# Patient Record
Sex: Female | Born: 1965 | Race: White | Hispanic: No | Smoking: Current every day smoker
Health system: Southern US, Community
[De-identification: ages and names within clinical notes are randomized; demographics above are authoritative.]

## PROBLEM LIST (undated history)

## (undated) DIAGNOSIS — L719 Rosacea, unspecified: Secondary | ICD-10-CM

## (undated) DIAGNOSIS — F419 Anxiety disorder, unspecified: Secondary | ICD-10-CM

## (undated) DIAGNOSIS — A419 Sepsis, unspecified organism: Secondary | ICD-10-CM

## (undated) DIAGNOSIS — Z87442 Personal history of urinary calculi: Secondary | ICD-10-CM

## (undated) DIAGNOSIS — Z86010 Personal history of colonic polyps: Secondary | ICD-10-CM

## (undated) DIAGNOSIS — M654 Radial styloid tenosynovitis [de Quervain]: Secondary | ICD-10-CM

## (undated) DIAGNOSIS — D131 Benign neoplasm of stomach: Secondary | ICD-10-CM

## (undated) DIAGNOSIS — F32A Depression, unspecified: Secondary | ICD-10-CM

## (undated) DIAGNOSIS — F329 Major depressive disorder, single episode, unspecified: Secondary | ICD-10-CM

## (undated) DIAGNOSIS — C801 Malignant (primary) neoplasm, unspecified: Secondary | ICD-10-CM

## (undated) DIAGNOSIS — N390 Urinary tract infection, site not specified: Secondary | ICD-10-CM

## (undated) DIAGNOSIS — K5790 Diverticulosis of intestine, part unspecified, without perforation or abscess without bleeding: Secondary | ICD-10-CM

## (undated) DIAGNOSIS — E786 Lipoprotein deficiency: Secondary | ICD-10-CM

## (undated) HISTORY — DX: Personal history of colonic polyps: Z86.010

## (undated) HISTORY — PX: VAGINAL HYSTERECTOMY: SUR661

## (undated) HISTORY — PX: TUBAL LIGATION: SHX77

## (undated) HISTORY — DX: Diverticulosis of intestine, part unspecified, without perforation or abscess without bleeding: K57.90

## (undated) HISTORY — DX: Rosacea, unspecified: L71.9

## (undated) HISTORY — DX: Anxiety disorder, unspecified: F41.9

## (undated) HISTORY — PX: POLYPECTOMY: SHX149

## (undated) HISTORY — PX: OOPHORECTOMY: SHX86

## (undated) HISTORY — DX: Radial styloid tenosynovitis (de quervain): M65.4

## (undated) HISTORY — PX: COLONOSCOPY: SHX174

## (undated) HISTORY — DX: Benign neoplasm of stomach: D13.1

## (undated) HISTORY — DX: Malignant (primary) neoplasm, unspecified: C80.1

## (undated) HISTORY — PX: MULTIPLE TOOTH EXTRACTIONS: SHX2053

## (undated) HISTORY — DX: Urinary tract infection, site not specified: N39.0

## (undated) HISTORY — PX: WISDOM TOOTH EXTRACTION: SHX21

## (undated) HISTORY — DX: Lipoprotein deficiency: E78.6

## (undated) HISTORY — DX: Sepsis, unspecified organism: A41.9

## (undated) HISTORY — PX: UMBILICAL HERNIA REPAIR: SHX196

---

## 1998-10-23 ENCOUNTER — Other Ambulatory Visit: Admission: RE | Admit: 1998-10-23 | Discharge: 1998-10-23 | Payer: Self-pay | Admitting: Family Medicine

## 2000-09-01 ENCOUNTER — Other Ambulatory Visit: Admission: RE | Admit: 2000-09-01 | Discharge: 2000-09-01 | Payer: Self-pay | Admitting: Family Medicine

## 2000-11-09 ENCOUNTER — Encounter: Admission: RE | Admit: 2000-11-09 | Discharge: 2000-11-09 | Payer: Self-pay | Admitting: Family Medicine

## 2000-11-09 ENCOUNTER — Encounter: Payer: Self-pay | Admitting: Family Medicine

## 2001-11-30 ENCOUNTER — Other Ambulatory Visit: Admission: RE | Admit: 2001-11-30 | Discharge: 2001-11-30 | Payer: Self-pay | Admitting: Family Medicine

## 2001-12-31 ENCOUNTER — Emergency Department (HOSPITAL_COMMUNITY): Admission: EM | Admit: 2001-12-31 | Discharge: 2001-12-31 | Payer: Self-pay | Admitting: Emergency Medicine

## 2003-01-08 ENCOUNTER — Other Ambulatory Visit: Admission: RE | Admit: 2003-01-08 | Discharge: 2003-01-08 | Payer: Self-pay | Admitting: Family Medicine

## 2003-04-09 ENCOUNTER — Encounter (INDEPENDENT_AMBULATORY_CARE_PROVIDER_SITE_OTHER): Payer: Self-pay

## 2003-04-09 ENCOUNTER — Ambulatory Visit (HOSPITAL_COMMUNITY): Admission: RE | Admit: 2003-04-09 | Discharge: 2003-04-09 | Payer: Self-pay | Admitting: *Deleted

## 2003-04-09 HISTORY — PX: CERVICAL BIOPSY  W/ LOOP ELECTRODE EXCISION: SUR135

## 2003-08-20 ENCOUNTER — Other Ambulatory Visit: Admission: RE | Admit: 2003-08-20 | Discharge: 2003-08-20 | Payer: Self-pay | Admitting: Family Medicine

## 2003-12-24 ENCOUNTER — Other Ambulatory Visit: Admission: RE | Admit: 2003-12-24 | Discharge: 2003-12-24 | Payer: Self-pay | Admitting: Family Medicine

## 2004-05-12 ENCOUNTER — Other Ambulatory Visit: Admission: RE | Admit: 2004-05-12 | Discharge: 2004-05-12 | Payer: Self-pay | Admitting: Family Medicine

## 2004-09-11 ENCOUNTER — Emergency Department (HOSPITAL_COMMUNITY): Admission: EM | Admit: 2004-09-11 | Discharge: 2004-09-11 | Payer: Self-pay | Admitting: Emergency Medicine

## 2004-09-13 ENCOUNTER — Ambulatory Visit: Payer: Self-pay | Admitting: Family Medicine

## 2004-09-13 ENCOUNTER — Encounter: Admission: RE | Admit: 2004-09-13 | Discharge: 2004-09-13 | Payer: Self-pay | Admitting: Family Medicine

## 2004-10-20 ENCOUNTER — Other Ambulatory Visit: Admission: RE | Admit: 2004-10-20 | Discharge: 2004-10-20 | Payer: Self-pay | Admitting: Family Medicine

## 2004-10-20 ENCOUNTER — Ambulatory Visit: Payer: Self-pay | Admitting: Family Medicine

## 2005-03-01 ENCOUNTER — Ambulatory Visit: Payer: Self-pay | Admitting: Family Medicine

## 2005-03-09 ENCOUNTER — Ambulatory Visit: Payer: Self-pay | Admitting: Family Medicine

## 2005-04-19 ENCOUNTER — Ambulatory Visit: Payer: Self-pay | Admitting: Family Medicine

## 2005-10-07 ENCOUNTER — Ambulatory Visit: Payer: Self-pay | Admitting: Family Medicine

## 2005-12-28 ENCOUNTER — Encounter: Payer: Self-pay | Admitting: Family Medicine

## 2005-12-28 ENCOUNTER — Ambulatory Visit: Payer: Self-pay | Admitting: Family Medicine

## 2005-12-28 ENCOUNTER — Other Ambulatory Visit: Admission: RE | Admit: 2005-12-28 | Discharge: 2005-12-28 | Payer: Self-pay | Admitting: Family Medicine

## 2005-12-28 LAB — CONVERTED CEMR LAB: Pap Smear: NORMAL

## 2006-03-30 ENCOUNTER — Emergency Department (HOSPITAL_COMMUNITY): Admission: EM | Admit: 2006-03-30 | Discharge: 2006-03-30 | Payer: Self-pay | Admitting: Emergency Medicine

## 2006-08-08 HISTORY — PX: UPPER GASTROINTESTINAL ENDOSCOPY: SHX188

## 2006-10-17 ENCOUNTER — Ambulatory Visit: Payer: Self-pay | Admitting: Internal Medicine

## 2006-10-17 LAB — CONVERTED CEMR LAB
ALT: 37 units/L (ref 0–40)
AST: 26 units/L (ref 0–37)
Albumin: 3.5 g/dL (ref 3.5–5.2)
Alkaline Phosphatase: 64 units/L (ref 39–117)
Amylase: 42 units/L (ref 27–131)
BUN: 12 mg/dL (ref 6–23)
Basophils Absolute: 0.2 10*3/uL — ABNORMAL HIGH (ref 0.0–0.1)
Basophils Relative: 1.3 % — ABNORMAL HIGH (ref 0.0–1.0)
Bilirubin, Direct: 0.1 mg/dL (ref 0.0–0.3)
CO2: 23 meq/L (ref 19–32)
Calcium: 9 mg/dL (ref 8.4–10.5)
Chloride: 107 meq/L (ref 96–112)
Creatinine, Ser: 0.7 mg/dL (ref 0.4–1.2)
Eosinophils Absolute: 0.4 10*3/uL (ref 0.0–0.6)
Eosinophils Relative: 2.6 % (ref 0.0–5.0)
GFR calc Af Amer: 117 mL/min
GFR calc non Af Amer: 97 mL/min
Glucose, Bld: 92 mg/dL (ref 70–99)
HCT: 44.9 % (ref 36.0–46.0)
Hemoglobin: 15.2 g/dL — ABNORMAL HIGH (ref 12.0–15.0)
Lipase: 19 units/L (ref 11.0–59.0)
Lymphocytes Relative: 30.7 % (ref 12.0–46.0)
MCHC: 33.9 g/dL (ref 30.0–36.0)
MCV: 86.3 fL (ref 78.0–100.0)
Monocytes Absolute: 1 10*3/uL — ABNORMAL HIGH (ref 0.2–0.7)
Monocytes Relative: 6.4 % (ref 3.0–11.0)
Neutro Abs: 8.8 10*3/uL — ABNORMAL HIGH (ref 1.4–7.7)
Neutrophils Relative %: 59 % (ref 43.0–77.0)
Platelets: 332 10*3/uL (ref 150–400)
Potassium: 3.6 meq/L (ref 3.5–5.1)
RBC: 5.2 M/uL — ABNORMAL HIGH (ref 3.87–5.11)
RDW: 12.9 % (ref 11.5–14.6)
Sodium: 140 meq/L (ref 135–145)
Total Bilirubin: 0.5 mg/dL (ref 0.3–1.2)
Total Protein: 6.4 g/dL (ref 6.0–8.3)
WBC: 15 10*3/uL — ABNORMAL HIGH (ref 4.5–10.5)

## 2006-10-20 ENCOUNTER — Ambulatory Visit: Payer: Self-pay | Admitting: Internal Medicine

## 2006-10-20 ENCOUNTER — Encounter (INDEPENDENT_AMBULATORY_CARE_PROVIDER_SITE_OTHER): Payer: Self-pay | Admitting: Specialist

## 2006-11-27 ENCOUNTER — Ambulatory Visit: Payer: Self-pay | Admitting: Internal Medicine

## 2007-02-21 ENCOUNTER — Encounter: Payer: Self-pay | Admitting: Family Medicine

## 2007-02-21 DIAGNOSIS — D72828 Other elevated white blood cell count: Secondary | ICD-10-CM | POA: Insufficient documentation

## 2007-02-21 DIAGNOSIS — Z720 Tobacco use: Secondary | ICD-10-CM | POA: Insufficient documentation

## 2007-02-21 DIAGNOSIS — K2980 Duodenitis without bleeding: Secondary | ICD-10-CM | POA: Insufficient documentation

## 2007-02-21 DIAGNOSIS — F39 Unspecified mood [affective] disorder: Secondary | ICD-10-CM

## 2007-02-21 DIAGNOSIS — L259 Unspecified contact dermatitis, unspecified cause: Secondary | ICD-10-CM | POA: Insufficient documentation

## 2007-02-21 DIAGNOSIS — L719 Rosacea, unspecified: Secondary | ICD-10-CM | POA: Insufficient documentation

## 2007-02-21 DIAGNOSIS — F17200 Nicotine dependence, unspecified, uncomplicated: Secondary | ICD-10-CM

## 2007-02-21 DIAGNOSIS — F329 Major depressive disorder, single episode, unspecified: Secondary | ICD-10-CM | POA: Insufficient documentation

## 2007-02-21 HISTORY — DX: Unspecified mood (affective) disorder: F39

## 2007-02-28 ENCOUNTER — Encounter: Payer: Self-pay | Admitting: Family Medicine

## 2007-02-28 ENCOUNTER — Ambulatory Visit: Payer: Self-pay | Admitting: Family Medicine

## 2007-02-28 ENCOUNTER — Other Ambulatory Visit: Admission: RE | Admit: 2007-02-28 | Discharge: 2007-02-28 | Payer: Self-pay | Admitting: Family Medicine

## 2007-02-28 DIAGNOSIS — E786 Lipoprotein deficiency: Secondary | ICD-10-CM | POA: Insufficient documentation

## 2007-02-28 DIAGNOSIS — E669 Obesity, unspecified: Secondary | ICD-10-CM | POA: Insufficient documentation

## 2007-03-01 LAB — CONVERTED CEMR LAB
ALT: 47 units/L — ABNORMAL HIGH (ref 0–35)
AST: 37 units/L (ref 0–37)
Albumin: 3.6 g/dL (ref 3.5–5.2)
Alkaline Phosphatase: 69 units/L (ref 39–117)
BUN: 12 mg/dL (ref 6–23)
Basophils Absolute: 0.4 10*3/uL — ABNORMAL HIGH (ref 0.0–0.1)
Basophils Relative: 2.3 % — ABNORMAL HIGH (ref 0.0–1.0)
Bilirubin, Direct: 0.1 mg/dL (ref 0.0–0.3)
CO2: 28 meq/L (ref 19–32)
Calcium: 9.4 mg/dL (ref 8.4–10.5)
Chloride: 108 meq/L (ref 96–112)
Creatinine, Ser: 0.7 mg/dL (ref 0.4–1.2)
Eosinophils Absolute: 0.3 10*3/uL (ref 0.0–0.6)
Eosinophils Relative: 1.7 % (ref 0.0–5.0)
GFR calc Af Amer: 117 mL/min
GFR calc non Af Amer: 97 mL/min
Glucose, Bld: 84 mg/dL (ref 70–99)
HCT: 43.8 % (ref 36.0–46.0)
Hemoglobin: 15.2 g/dL — ABNORMAL HIGH (ref 12.0–15.0)
Lymphocytes Relative: 25.8 % (ref 12.0–46.0)
MCHC: 34.6 g/dL (ref 30.0–36.0)
MCV: 86.8 fL (ref 78.0–100.0)
Monocytes Absolute: 0.4 10*3/uL (ref 0.2–0.7)
Monocytes Relative: 2.4 % — ABNORMAL LOW (ref 3.0–11.0)
Neutro Abs: 11.1 10*3/uL — ABNORMAL HIGH (ref 1.4–7.7)
Neutrophils Relative %: 67.8 % (ref 43.0–77.0)
Platelets: 305 10*3/uL (ref 150–400)
Potassium: 4.7 meq/L (ref 3.5–5.1)
RBC: 5.05 M/uL (ref 3.87–5.11)
RDW: 13.4 % (ref 11.5–14.6)
Sodium: 143 meq/L (ref 135–145)
TSH: 1.14 microintl units/mL (ref 0.35–5.50)
Total Bilirubin: 0.3 mg/dL (ref 0.3–1.2)
Total Protein: 6.3 g/dL (ref 6.0–8.3)
WBC: 15.9 10*3/uL — ABNORMAL HIGH (ref 4.5–10.5)

## 2007-03-05 ENCOUNTER — Encounter (INDEPENDENT_AMBULATORY_CARE_PROVIDER_SITE_OTHER): Payer: Self-pay | Admitting: *Deleted

## 2007-03-05 LAB — CONVERTED CEMR LAB: Pap Smear: NORMAL

## 2007-03-06 ENCOUNTER — Encounter (INDEPENDENT_AMBULATORY_CARE_PROVIDER_SITE_OTHER): Payer: Self-pay | Admitting: *Deleted

## 2007-03-14 ENCOUNTER — Encounter: Admission: RE | Admit: 2007-03-14 | Discharge: 2007-03-14 | Payer: Self-pay | Admitting: Family Medicine

## 2007-03-16 ENCOUNTER — Encounter (INDEPENDENT_AMBULATORY_CARE_PROVIDER_SITE_OTHER): Payer: Self-pay | Admitting: *Deleted

## 2007-11-07 ENCOUNTER — Ambulatory Visit: Payer: Self-pay | Admitting: Family Medicine

## 2007-11-07 DIAGNOSIS — L909 Atrophic disorder of skin, unspecified: Secondary | ICD-10-CM | POA: Insufficient documentation

## 2007-11-07 DIAGNOSIS — L820 Inflamed seborrheic keratosis: Secondary | ICD-10-CM | POA: Insufficient documentation

## 2007-11-07 DIAGNOSIS — L919 Hypertrophic disorder of the skin, unspecified: Secondary | ICD-10-CM

## 2008-01-29 ENCOUNTER — Telehealth: Payer: Self-pay | Admitting: Family Medicine

## 2008-03-03 ENCOUNTER — Other Ambulatory Visit: Admission: RE | Admit: 2008-03-03 | Discharge: 2008-03-03 | Payer: Self-pay | Admitting: Family Medicine

## 2008-03-03 ENCOUNTER — Encounter: Payer: Self-pay | Admitting: Family Medicine

## 2008-03-03 ENCOUNTER — Ambulatory Visit: Payer: Self-pay | Admitting: Family Medicine

## 2008-03-03 LAB — CONVERTED CEMR LAB: Pap Smear: NORMAL

## 2008-03-05 ENCOUNTER — Telehealth: Payer: Self-pay | Admitting: Family Medicine

## 2008-03-05 DIAGNOSIS — N920 Excessive and frequent menstruation with regular cycle: Secondary | ICD-10-CM | POA: Insufficient documentation

## 2008-03-06 LAB — CONVERTED CEMR LAB
ALT: 34 units/L (ref 0–35)
AST: 27 units/L (ref 0–37)
Albumin: 3.7 g/dL (ref 3.5–5.2)
Alkaline Phosphatase: 70 units/L (ref 39–117)
BUN: 12 mg/dL (ref 6–23)
Bilirubin, Direct: 0.1 mg/dL (ref 0.0–0.3)
CO2: 24 meq/L (ref 19–32)
Calcium: 8.8 mg/dL (ref 8.4–10.5)
Chloride: 107 meq/L (ref 96–112)
Cholesterol: 166 mg/dL (ref 0–200)
Creatinine, Ser: 0.8 mg/dL (ref 0.4–1.2)
GFR calc Af Amer: 100 mL/min
GFR calc non Af Amer: 83 mL/min
Glucose, Bld: 126 mg/dL — ABNORMAL HIGH (ref 70–99)
HCT: 43.5 % (ref 36.0–46.0)
HDL: 27.2 mg/dL — ABNORMAL LOW (ref >39.0)
Hemoglobin: 14.9 g/dL (ref 12.0–15.0)
LDL Cholesterol: 111 mg/dL — ABNORMAL HIGH (ref 0–99)
MCHC: 34.1 g/dL (ref 30.0–36.0)
MCV: 87.8 fL (ref 78.0–100.0)
Platelets: 318 10*3/uL (ref 150–400)
Potassium: 3.6 meq/L (ref 3.5–5.1)
RBC: 4.96 M/uL (ref 3.87–5.11)
RDW: 13.5 % (ref 11.5–14.6)
Sodium: 140 meq/L (ref 135–145)
TSH: 0.85 microintl units/mL (ref 0.35–5.50)
Total Bilirubin: 0.4 mg/dL (ref 0.3–1.2)
Total CHOL/HDL Ratio: 6.1
Total Protein: 6.1 g/dL (ref 6.0–8.3)
Triglycerides: 137 mg/dL (ref 0–149)
VLDL: 27 mg/dL (ref 0–40)
WBC: 13.3 10*3/uL — ABNORMAL HIGH (ref 4.5–10.5)

## 2008-03-19 ENCOUNTER — Encounter: Payer: Self-pay | Admitting: Family Medicine

## 2008-05-21 ENCOUNTER — Encounter (INDEPENDENT_AMBULATORY_CARE_PROVIDER_SITE_OTHER): Payer: Self-pay | Admitting: Obstetrics and Gynecology

## 2008-05-21 ENCOUNTER — Ambulatory Visit (HOSPITAL_COMMUNITY): Admission: RE | Admit: 2008-05-21 | Discharge: 2008-05-21 | Payer: Self-pay | Admitting: Obstetrics and Gynecology

## 2008-10-23 ENCOUNTER — Encounter (INDEPENDENT_AMBULATORY_CARE_PROVIDER_SITE_OTHER): Payer: Self-pay | Admitting: Obstetrics and Gynecology

## 2008-10-23 ENCOUNTER — Ambulatory Visit (HOSPITAL_COMMUNITY): Admission: RE | Admit: 2008-10-23 | Discharge: 2008-10-24 | Payer: Self-pay | Admitting: Obstetrics and Gynecology

## 2009-01-12 ENCOUNTER — Telehealth: Payer: Self-pay | Admitting: Family Medicine

## 2009-03-16 ENCOUNTER — Encounter: Admission: RE | Admit: 2009-03-16 | Discharge: 2009-03-16 | Payer: Self-pay | Admitting: Obstetrics and Gynecology

## 2009-04-06 ENCOUNTER — Telehealth: Payer: Self-pay | Admitting: Family Medicine

## 2009-08-10 ENCOUNTER — Telehealth: Payer: Self-pay | Admitting: Family Medicine

## 2010-08-19 ENCOUNTER — Emergency Department (HOSPITAL_COMMUNITY)
Admission: EM | Admit: 2010-08-19 | Discharge: 2010-08-20 | Payer: Self-pay | Source: Home / Self Care | Admitting: Emergency Medicine

## 2010-08-20 ENCOUNTER — Ambulatory Visit (HOSPITAL_COMMUNITY)
Admission: AD | Admit: 2010-08-20 | Discharge: 2010-08-20 | Payer: Self-pay | Attending: Obstetrics and Gynecology | Admitting: Obstetrics and Gynecology

## 2010-08-23 LAB — URINE MICROSCOPIC-ADD ON

## 2010-08-23 LAB — BASIC METABOLIC PANEL
BUN: 14 mg/dL (ref 6–23)
CO2: 23 mEq/L (ref 19–32)
Calcium: 9.3 mg/dL (ref 8.4–10.5)
Chloride: 106 mEq/L (ref 96–112)
Creatinine, Ser: 0.79 mg/dL (ref 0.4–1.2)
GFR calc Af Amer: >60 mL/min (ref >60)
GFR calc non Af Amer: >60 mL/min (ref >60)
Glucose, Bld: 125 mg/dL — ABNORMAL HIGH (ref 70–99)
Potassium: 4.1 mEq/L (ref 3.5–5.1)
Sodium: 139 mEq/L (ref 135–145)

## 2010-08-23 LAB — TYPE AND SCREEN
ABO/RH(D): A POS
Antibody Screen: NEGATIVE

## 2010-08-23 LAB — CBC
HCT: 47.4 % — ABNORMAL HIGH (ref 36.0–46.0)
Hemoglobin: 16.3 g/dL — ABNORMAL HIGH (ref 12.0–15.0)
MCH: 29.7 pg (ref 26.0–34.0)
MCHC: 34.4 g/dL (ref 30.0–36.0)
MCV: 86.5 fL (ref 78.0–100.0)
Platelets: 328 10*3/uL (ref 150–400)
RBC: 5.48 MIL/uL — ABNORMAL HIGH (ref 3.87–5.11)
RDW: 13.1 % (ref 11.5–15.5)
WBC: 19.2 10*3/uL — ABNORMAL HIGH (ref 4.0–10.5)

## 2010-08-23 LAB — DIFFERENTIAL
Basophils Absolute: 0.1 10*3/uL (ref 0.0–0.1)
Basophils Relative: 0 % (ref 0–1)
Eosinophils Absolute: 0.4 10*3/uL (ref 0.0–0.7)
Eosinophils Relative: 2 % (ref 0–5)
Lymphocytes Relative: 23 % (ref 12–46)
Lymphs Abs: 4.4 10*3/uL — ABNORMAL HIGH (ref 0.7–4.0)
Monocytes Absolute: 1.3 10*3/uL — ABNORMAL HIGH (ref 0.1–1.0)
Monocytes Relative: 7 % (ref 3–12)
Neutro Abs: 13.1 10*3/uL — ABNORMAL HIGH (ref 1.7–7.7)
Neutrophils Relative %: 68 % (ref 43–77)

## 2010-08-23 LAB — URINALYSIS, ROUTINE W REFLEX MICROSCOPIC
Bilirubin Urine: NEGATIVE
Ketones, ur: NEGATIVE mg/dL
Leukocytes, UA: NEGATIVE
Nitrite: NEGATIVE
Protein, ur: NEGATIVE mg/dL
Specific Gravity, Urine: 1.021 (ref 1.005–1.030)
Urine Glucose, Fasting: NEGATIVE mg/dL
Urobilinogen, UA: 0.2 mg/dL (ref 0.0–1.0)
pH: 6.5 (ref 5.0–8.0)

## 2010-08-23 LAB — LIPASE, BLOOD: Lipase: 23 U/L (ref 11–59)

## 2010-08-23 LAB — ABO/RH: ABO/RH(D): A POS

## 2010-08-23 LAB — LACTIC ACID, PLASMA: Lactic Acid, Venous: 3.3 mmol/L — ABNORMAL HIGH (ref 0.5–2.2)

## 2010-09-07 NOTE — Progress Notes (Signed)
Summary: refill request for paxil  Phone Note Refill Request Message from:  Fax from Pharmacy  Refills Requested: Medication #1:  PAXIL 20 MG  TABS 1 by mouth once daily   Last Refilled: 07/11/2009 Faxed request from [facility], [address], phone [phone].  Initial call taken by: Lowella Petties CMA,  August 10, 2009 9:10 AM    Prescriptions: PAXIL 20 MG  TABS (PAROXETINE HCL) 1 by mouth once daily  #90 x 1   Entered and Authorized by:   Ruthe Mannan MD   Signed by:   Ruthe Mannan MD on 08/10/2009   Method used:   Electronically to        [facility] (retail)       [address]       [place], [place]  [address]       Ph: [phone]       Fax: [phone]   RxID:   [id number]

## 2010-09-10 NOTE — Op Note (Signed)
Jennifer [name]             ACCOUNT NO.:  [id number]  MEDICAL RECORD NO.:  [id number]          PATIENT TYPE:  AMB  LOCATION:  SDC                           [facility]  PHYSICIAN:  Juluis Mire, M.D.   DATE OF BIRTH:  14-Nov-1965  DATE OF PROCEDURE:  08/20/2010 DATE OF DISCHARGE:                              OPERATIVE REPORT   PREOPERATIVE DIAGNOSIS:  Torsed left ovary.  POSTOPERATIVE DIAGNOSIS:  Torsed left ovary.  PROCEDURE:  Laparoscopy with left salpingo-oophorectomy.  Cystoscopy.  SURGEON:  Juluis Mire, M.D.  ANESTHESIA:  General endotracheal anesthesia.  ESTIMATED BLOOD LOSS:  Minimal.  PACKS AND DRAINS:  None.  INTRAOPERATIVE BLOOD PLACED:  None.  COMPLICATIONS:  None.  INDICATIONS AS FOLLOWS:  The patient is a 45 year old female, previous LAVH presented with severe left lower quadrant pain.  Ultrasound evaluation revealed torsion of the left ovary.  We are going to proceed with left salpingo-oophorectomy.  The nature of the surgery and risk had been discussed including the risk of infection.  Risk of hemorrhage that could require transfusion with risk of AIDS or hepatitis.  Risk of injury to adjacent organs including bladder, bowel, ureters that could require further exploratory surgery.  Risk of deep venous thrombosis and pulmonary embolus.  PROCEDURE AS FOLLOWS:  The patient was taken to the OR, and placed in supine position.  After satisfactory level of general endotracheal anesthesia was obtained, the patient was placed in dorsal lithotomy position using the Allen stirrups.  The abdomen, perineum, and vagina were prepped out with Betadine.  A sponge on a sponge stick was placed in the vaginal vault.  Bladder was emptied by in-and-out catheterization.  The patient was then draped sterile field.  A subumbilical incision was made with knife and carried down through subcutaneous tissue.  Fascia was identified, entered sharply, incision was  fashioned laterally.  We were able to enter the peritoneum with blunt finger pressure.  An open laparoscopic trocar was put in place and secured.  Abdomen was  insufflated with carbon dioxide.  Laparoscope was introduced.  A 10/11 trocar was put in place in the suprapubic area and a 5-mm trocar was placed in the left lower quadrant under direct visualization.  The patient was placed in Trendelenburg position.  Right tube and ovary were unremarkable.  Left tube and ovary were black hemorrhagic consistent with torsion.  At this point in time, we brought in a Vicryl Endoloop through the left lower quadrant port.  We put a second 5-mm trocar in the right lower quadrant area under direct visualization.  We elevated the ovary and placed the Endoloop over the ovary and tube and cinched it tight.  We then cut the tube and ovary free from its attachment.  Next, we brought in an Endobag through the 10/11 suprapubic trocar.  The tube and ovary were placed in this, and we brought it out through the suprapubic port.  We did have to extend thefascia slightly.  Next, we re-visualized the ovarian stump.  There is about a centimeter length of still necrotic tube below the Endoloop.  We brought in 2 more Endoloops and  cinched this down closer to the pelvic sidewall.  We then excised the little stump of necrotic tissue and removed that through the suprapubic port.  We then cauterized the ovarian vascular using the bipolar.  At this point in time, we had good hemostasis.  Again, the right tube and ovary were clear.  We thoroughly irrigated the pelvis.  No bleeding was encountered.  The abdomen was deflated with carbon dioxide.  All trocars removed.  Subumbilical fascia was closed with figure-of-eight of 0 Vicryl.  Skin was closed with interrupted subcuticulars of 4-0 Vicryl.  Suprapubic fascia was closed with figure-of-eight of 2-0 Vicryl.  Skin was closed interrupted subcuticulars of 3-0 Vicryl.  Both left  and right 5-mm ports were closed with Dermabond.  The patient's legs were repositioned.  Sponge on a sponge stick was removed from the vaginal vault.  Cystoscopy was performed.  The patient had been given indigo carmine.  The bladder was intact.  Both ureteral orifice were identified and noted jets of blue tinged urine.  The cystoscope was removed.  Bladder was then drained.  The patient was taken out of the dorsal position once alert, extubated, and transferred to the recovery room in good condition.  Sponges, instrument, and needle count reported as correct by circulating nurse x2.     Juluis Mire, M.D.     JSM/MEDQ  D:  08/20/2010  T:  08/21/2010  Job:  [id number]  Electronically Signed by Richardean Chimera M.D. on 09/10/2010 01:35:16 PM

## 2010-11-18 LAB — CBC
HCT: 37.9 % (ref 36.0–46.0)
HCT: 47.1 % — ABNORMAL HIGH (ref 36.0–46.0)
Hemoglobin: 12.6 g/dL (ref 12.0–15.0)
Hemoglobin: 15.5 g/dL — ABNORMAL HIGH (ref 12.0–15.0)
MCHC: 32.9 g/dL (ref 30.0–36.0)
MCV: 88 fL (ref 78.0–100.0)
Platelets: 284 10*3/uL (ref 150–400)
RBC: 4.28 MIL/uL (ref 3.87–5.11)
RBC: 5.35 MIL/uL — ABNORMAL HIGH (ref 3.87–5.11)
RDW: 13.9 % (ref 11.5–15.5)
RDW: 14 % (ref 11.5–15.5)
WBC: 14.1 10*3/uL — ABNORMAL HIGH (ref 4.0–10.5)
WBC: 24.4 10*3/uL — ABNORMAL HIGH (ref 4.0–10.5)

## 2010-11-18 LAB — HCG, SERUM, QUALITATIVE: Preg, Serum: NEGATIVE

## 2010-12-21 NOTE — Op Note (Signed)
NAMEMORNA, FLUD             ACCOUNT NO.:  [id number]   MEDICAL RECORD NO.:  [id number]          PATIENT TYPE:  AMB   LOCATION:  SDC                           [facility]   PHYSICIAN:  Juluis Mire, M.D.   DATE OF BIRTH:  [birth year]   DATE OF PROCEDURE:  DATE OF DISCHARGE:                               OPERATIVE REPORT   PREOPERATIVE DIAGNOSIS:  Endometrial polyps.   POSTOPERATIVE DIAGNOSIS:  Endometrial polyps.   PROCEDURE:  Hysteroscopy, resection of multiple endometrial polyps along  with endometrial curettings.   SURGEON:  Juluis Mire, MD   ANESTHESIA:  General.   ESTIMATED BLOOD LOSS:  Minimal.   PACKS AND DRAINS:  None.   INTRAOPERATIVE BLOOD PLACED:  None.   COMPLICATIONS:  None.   INDICATIONS:  Are dictated in the history and physical.   PROCEDURE:  The patient was taken to the OR, placed in supine position.  After satisfactory level of general anesthesia was obtained, the patient  was placed in the dorsal supine position using Allen stirrups.  Perineum  and vagina cleansed out with Betadine and draped as sterile field.  Cervix was grasped with a single-tooth tenaculum.  We did instill 1%  Nesacaine in the form of paracervical block.  Uterus sounded to 10 cm.  Cervix serially dilated to a size 29 Pratt dilator.  Hysteroscope was  introduced.  Intraoperative cavity was distended using sorbitol.  Multiple endometrial polyps were noted.  These were resected and sent  for pathological review.  We then did endometrial curettings trying to  clear out the endometrium along with further resection.  At the end of  the procedure, all polyps had been removed.  We had a clean looking  uterine cavity.  Both tubal ostia were visualized and noted be normal.  At this point in time, the endoscope was taken out along with the single  tenaculum.  The single-tooth speculum.  Deficit was minimal.  There was  no signs of perforation or active vaginal bleeding.  The  patient was  taken out of the supine dorsal position.  Once alert and extubated,  transferred to the recovery room in good condition.      Juluis Mire, M.D.  Electronically Signed    JSM/MEDQ  D:  05/21/2008  T:  05/22/2008  Job:  [id number]

## 2010-12-21 NOTE — Discharge Summary (Signed)
Jennifer [name], Jennifer [name]             ACCOUNT NO.:  [id number]   MEDICAL RECORD NO.:  [id number]          PATIENT TYPE:  OIB   LOCATION:  9316                          [facility]ILITY:  [facility]   PHYSICIAN:  Juluis Mire, M.D.   DATE OF BIRTH:  [birth year]   DATE OF ADMISSION:  10/23/2008  DATE OF DISCHARGE:  10/24/2008                               DISCHARGE SUMMARY   ADMITTING DIAGNOSIS:  Menorrhagia.   DISCHARGE DIAGNOSIS:  Menorrhagia.   OPERATIVE PROCEDURE:  Laparoscopic-assisted vaginal hysterectomy.  For  complete history and physical, please see dictated note.   HOSPITAL COURSE:  The patient underwent laparoscopic-assisted vaginal  hysterectomy.  Pathology is pending.  Postop did well.  Her postop  hemoglobin was 12.6.  She was discharged home on first postop day.  She  was afebrile, stable vital signs.  Abdomen is soft, nontender.  Bowel  sounds were active.  All incisions were clear.  She was tolerating her  diet.  She was voiding without difficulty..   In terms of complication, none were encountered during her stay in the  hospital.  The patient was discharged home in stable condition.   DISPOSITION:  Routine postop instructions were given.  She is to avoid  heavy lifting, vaginal instruments, or driving the car.  She is to watch  for signs of infection.  She should call for nausea and vomiting.  She  should report active vaginal bleeding or any increasing abdominal pelvic  pain.  She was also instructed for signs of deep venous thrombosis and  pulmonary embolus.  Discharged home on Tylox as needed for pain.  Office  will call her on Monday and arrange followup.      Juluis Mire, M.D.  Electronically Signed     JSM/MEDQ  D:  10/24/2008  T:  10/24/2008  Job:  [id number]

## 2010-12-21 NOTE — Op Note (Signed)
NAMEJEFFRIE, STANDER             ACCOUNT NO.:  [id number]   MEDICAL RECORD NO.:  [id number]          PATIENT TYPE:  OIB   LOCATION:  9316                          [facility]   PHYSICIAN:  Juluis Mire, M.D.   DATE OF BIRTH:  01/18/1966   DATE OF PROCEDURE:  10/23/2008  DATE OF DISCHARGE:                               OPERATIVE REPORT   PREOPERATIVE DIAGNOSIS:  Menorrhagia.   POSTOPERATIVE DIAGNOSIS:  Menorrhagia.   OPERATIVE PROCEDURE:  Laparoscopic-assisted vaginal hysterectomy.   SURGEON:  Juluis Mire, MD   ASSISTANT:  Guy Sandifer. Tomblin, MD   ANESTHESIA:  General endotracheal.   ESTIMATED BLOOD LOSS:  200 mL.   PACKS AND DRAINS:  None.   INTRAOPERATIVE BLOOD PLACED:  None.   COMPLICATIONS:  None.   INDICATIONS:  Dictated in history and physical.   PROCEDURE:  As follows.  The patient taken to OR, placed in supine  position.  After satisfactory level of general endotracheal anesthesia  was obtained, the patient was placed in a dorsal supine position using  the Allen stirrups.  The abdomen, perineum, and vagina prepped out with  Betadine.  Bladder was emptied by in-and-out catheterization.  A Hulka  tenaculum was put in place and secured.  The patient was draped in a  sterile field.  A subumbilical incision was made with knife and extended  through subcutaneous tissue.  Fascia was identified, entered sharply,  incision fashioned laterally.  Rectus muscle were separated.  The  peritoneum was identified and entered sharply.  There was no evidence of  injury to intestines at this point.  The operating laparoscopic trocar  was put in place and secured.  The abdomen was inflated with carbon  dioxide.  The laparoscope was introduced.  There was no evidence of  injury to adjacent organs.  A 5-mm trocar was put in place in the  suprapubic area under direct visualization.  At this point in time, the  uterus was elevated.  Uterus was upper limits of normal size.  Tubes  and  ovaries unremarkable.  The right ovary had a simple cyst.  Next, using  the EnSeal, the right utero-ovarian pedicle was cauterized and incised.  The right tube and mesosalpinx were cauterized and incised.  The right  round ligament was cauterized and incised.  We then went to the left  side, the left utero-ovarian pedicle was cauterized and incised, the  left tube and mesosalpinx were cauterized and incised, and the left  round ligament was cauterized and incised.  We had good freeing up  within the adnexa.  The decision was to go vaginally at this point in  time.   Abdomen was de-inflated with carbon dioxide.  Laparoscope was removed.  Hulka tenaculum was taken out.  The patient's legs were repositioned.  A  weighted speculum was placed in the vaginal vault.  The cervix was  grasped with a Christella Hartigan tenaculum.  Cul-de-sac was entered sharply.  Both  uterosacral ligaments were clamped, cut, and suture ligated 0-Vicryl.  The reflection of the vaginal mucosa anteriorly was incised and the  bladder was dissected  superiorly.  Paracervical tissue was clamped and  suture ligated with 0-Vicryl.  Vesicouterine space was identified and  entered sharply and retractors were put in place.  Using a clamp, cut,  and tie technique with suture ligatures of 0-Vicryl, the parametrium  were serially separated from the sides of the uterus.  The uterus was  then flipped.  It came free, there was no remaining pedicles.  At this  point in time, the posterior vaginal cuff was run with a running  interlocking suture of 0-Vicryl.  We got fair hemostasis.  Vaginal  mucosa was reapproximated with interrupted figure-of-eights of 2-0  Monocryl.  Uterosacral ligaments were then cut at this point in time.  Foley was placed to straight drain.  We did obtain adequate amount of  clear urine.   At this point in time, legs were repositioned.  Abdomen was re-inflated  with carbon dioxide.  Laparoscope was reintroduced.   The right side of  vaginal cuff, there was a small pumper or an arterial bleeder.  This was  brought under control using the bipolar.  Both ovaries were  hemostatically intact.  At this point in time, the vaginal cuff was  hemostatically intact.  We thoroughly irrigated the pelvis.  The abdomen  was de-inflated with carbon dioxide.  All trocars were removed.  Subumbilical fascia was closed with figure-of-eight of 0-Vicryl.  Skin  was closed with interrupted subcuticular of 4-0 Vicryl.  Suprapubic  incision closed with Dermabond.  The patient taken out of dorsal supine  position, once alert and extubated, transferred to recovery room in good  condition.  Sponge, instrument, and needle count was reported correct by  circulating nurse x2.      Juluis Mire, M.D.  Electronically Signed     JSM/MEDQ  D:  10/23/2008  T:  10/23/2008  Job:  [id number]

## 2010-12-21 NOTE — H&P (Signed)
Jennifer [name], COMP             ACCOUNT NO.:  [id number]   MEDICAL RECORD NO.:  [id number]          PATIENT TYPE:  AMB   LOCATION:  SDC                           [facility]   PHYSICIAN:  Juluis Mire, M.D.   DATE OF BIRTH:  June 25, 1966   DATE OF ADMISSION:  05/21/2008  DATE OF DISCHARGE:                              HISTORY & PHYSICAL   The patient is a 45 year old gravida 2, para 2 female who presents for  hysteroscopic evaluation.   The patient had been having trouble with increasing abnormal bleeding.  In view of this, she underwent a saline infusion ultrasound that did  reveal a large endometrial polyp.  She now presents for hysteroscopic  resection.   In terms of allergies, no known drug allergies.   Medication include Paxil and ibuprofen.   PAST MEDICAL HISTORY:  Usual childhood diseases.   PAST SURGICAL HISTORY:  She had a hernia repair, LEEP procedure of the  cervix, and endoscopy.   OBSTETRICAL HISTORY:  Two vaginal deliveries.   In terms of social history, does smoke half pack per day.  No alcohol  use.   FAMILY HISTORY:  There is a history of skin cancer in the form of  melanoma.   REVIEW OF SYSTEMS:  Noncontributory.   PHYSICAL EXAMINATION:  VITAL SIGNS:  The patient is afebrile, stable  vital signs.  HEENT: The patient is normocephalic.  Pupils equal, round, and reactive  to light and accommodation.  Extraocular movements were intact.  Sclerae  and conjunctivae clear.  Oropharynx clear.  NECK:  Without thyromegaly.  BREASTS:  Not examined.  LUNGS:  Clear.  CARDIOVASCULAR:  Regular rate.  No murmurs or gallops.  ABDOMEN:  Benign.  No mass, organomegaly, or tenderness.  PELVIC:  Normal external genitalia.  Vaginal mucosa is clear.  Cervix  unremarkable.  Uterus normal size, shape, and contour.  Adnexa, free of  masses or tenderness.  EXTREMITIES:  Trace edema.  NEUROLOGIC:  Grossly normal limits.   IMPRESSION:  Abnormal uterine bleeding with  endometrial polyp.   PLAN:  The patient will undergo hysteroscopic evaluation resection.  The  risks have been discussed including the risk of infection.  The risk of  hemorrhage could require transfusion with the risk of AIDS or hepatitis.  Excessive  bleeding could require hysterectomy.  Risk of injury to adjacent organs  through perforation that could require laparoscopies, possible  exploratory surgery, risk of deep venous thrombosis, and pulmonary  embolus.  The patient appears to understand indications and potential  risks and complications.      Juluis Mire, M.D.  Electronically Signed     JSM/MEDQ  D:  05/21/2008  T:  05/21/2008  Job:  [id number]

## 2010-12-21 NOTE — H&P (Signed)
NAME:  Jennifer [name], Jennifer [name]             ACCOUNT NO.:  [id number]   MEDICAL RECORD NO.:  [id number]          PATIENT TYPE:  AMB   LOCATION:  SDC                           [facility]   PHYSICIAN:  Juluis Mire, M.D.   DATE OF BIRTH:  September 08, 1965   DATE OF ADMISSION:  DATE OF DISCHARGE:                              HISTORY & PHYSICAL   The patient is a 45 year old gravida 2, para 2 female, who presents for  laparoscopic-assisted vaginal hysterectomy.   The patient initially referred to our practice with increasing menstrual  flow.  The patient was having cycles that have been increasingly heavy.  They were varying from 3-6 weeks.  She described the flow is heavy  having changing pads and tampons approximately every hour with  associated clots and dysmenorrhea.  She has had a previous bilateral  tubal ligation.  She is sexually active with no discomfort.  Her  hemoglobin had remained stable.  Does smoke half a pack per day.  Therefore, birth control pills were contraindicated.  We did a saline  infusion ultrasound.  It did reveal several endometrial polyps,  underwent hysteroscopic resection in February 2010.  Pathology revealed  no evidence of hyperplasia or malignancy.  Despite resection of polyps,  periods remained heavy and uncontrollable, and the patient has decided  to proceed with laparoscopic-assisted vaginal hysterectomy.  Other  alternatives including the use of IUD or ablation have been discussed.   In terms of allergies, she has no known drug allergies.   Medications include Paxil and ibuprofen.   PAST MEDICAL HISTORY:  Usual childhood diseases.  No significant  sequelae.   PAST SURGICAL HISTORY:  She had a hernia repair, previous LEEP of the  cervix, and she has had endoscopy, and then the above-noted  hysteroscopy.   OBSTETRICAL HISTORY:  Two vaginal deliveries.   SOCIAL HISTORY:  Smokes half pack per day.  No alcohol.   FAMILY HISTORY:  History of skin cancer for  melanoma.   REVIEW OF SYSTEMS:  Noncontributory.   PHYSICAL EXAMINATION:  GENERAL:  The patient is afebrile with stable  vital signs.  HEENT:  The patient is normocephalic.  Pupils equal, round, and reactive  to light and accommodation.  Extraocular movements were intact.  Sclerae  and conjunctivae are clear.  Oropharynx clear.  NECK:  Without thyromegaly.  BREASTS:  Not examined.  LUNGS:  Clear.  CARDIOVASCULAR:  Regular rhythm and rate without murmurs or gallops.  ABDOMEN:  Benign.  No mass, organomegaly, or tenderness.  PELVIC:  Normal external genitalia.  Vaginal mucosa is clear.  Cervix  unremarkable.  Uterus normal size, shape, and contour.  Adnexa free of  masses or tenderness.  EXTREMITIES:  Trace edema.  NEUROLOGIC:  Grossly within normal limits.   Impression therefore is menorrhagia, unresponsive to conservative  management.   PLAN:  The patient undergo laparoscopic-assisted vaginal hysterectomy.  Alternatives have been discussed.  Risks of surgery have been explained  including the risk of infection.  The risk of hemorrhage could require  transfusion with risk of AIDS or hepatitis.  Risk of injury to adjacent  organs including bladder, bowel, or ureters could require further  exploratory surgery.  Risk of deep venous thrombosis and pulmonary  embolus.  The patient expressed understanding of indications and risks.      Juluis Mire, M.D.  Electronically Signed     JSM/MEDQ  D:  10/23/2008  T:  10/23/2008  Job:  [id number]

## 2010-12-24 NOTE — Op Note (Signed)
   NAME:  Jennifer [name], Jennifer [name]                       ACCOUNT NO.:  [id number]   MEDICAL RECORD NO.:  [id number]                   PATIENT TYPE:  AMB   LOCATION:  SDC                                  [facility]   PHYSICIAN:  Georgina Peer, M.D.              DATE OF BIRTH:  10-21-1965   DATE OF PROCEDURE:  04/09/2003  DATE OF DISCHARGE:                                 OPERATIVE REPORT   PREOPERATIVE DIAGNOSIS:  High-grade cervical dysplasia.   POSTOPERATIVE DIAGNOSIS:  High-grade cervical dysplasia.   OPERATION PERFORMED:  Loop electrosurgical excision procedure.   SURGEON:  Georgina Peer, M.D.   ANESTHESIA:  MAC with IV sedation with 6 mL 2% Xylocaine with epinephrine  intracervical.   ESTIMATED BLOOD LOSS:  Less than 25 mL.   COMPLICATIONS:  None.   FINDINGS:  Lesions around the external cervical os as previously seen on  colposcopy.   INDICATIONS:  A 45 year old white female with an abnormal Pap and a  colposcopy showing a high-grade cervical dysplasia on biopsy.  The  colposcopy was adequate, and she was brought in for excisional treatment.   DESCRIPTION OF PROCEDURE:  The patient after informed consent was taken to  the operating room and given IV sedation.  She was placed in the dorsal  lithotomy position.  The cervix was visualized with a speculum and washed  with 3% acetic acid and then Lugol's iodine.  Nonstaining areas were  identified.  Xylocaine 2% with epinephrine 6 mL were injected at the 12, 3,  6, and 9 o'clock positions.  The cervix blanched.  A white-handled loop  electrode with cutting at 100 watts and coagulation at 80 watts was used to  excise the posterior cervical lip.  There was a small contact with the left  vaginal wall, but there was no bleeding and no repair needed.  The anterior  cervical lip was similarly excised.  These were marked and sent as specimens  on cork board.  The base of the LEEP crater was cauterized with ball  coagulation at  80 watts and the exocervix was then superficially cauterized  to 3-5 mm from the crater circumferentially.  There was no bleeding.  Sponge, needle, and instrument counts were correct.  The patient was  transferred to the recovery area in good condition.  Pathology is pending.                                               Georgina Peer, M.D.    JPN/MEDQ  D:  04/09/2003  T:  04/09/2003  Job:  [id number]   cc:   Marne A. Milinda Antis, M.D. [facility]

## 2010-12-24 NOTE — Assessment & Plan Note (Signed)
[facility]                         GASTROENTEROLOGY OFFICE NOTE   Jennifer [name], Jennifer [name] ALDORA PERMAN                    MRN:          [id number]  DATE:10/17/2006                            DOB:          10/12/1965    PRIMARY CARE PHYSICIAN:  Marne A. Milinda Antis, M.D.   REASON FOR CONSULTATION:  Stomach pain and vomiting.   ASSESSMENT:  A 45 year old white woman with episodic problems with left  sided, mainly left upper quadrant pain, nausea, vomiting, and diarrhea,  more nausea and vomiting than diarrhea.  She is on chronic ibuprofen.  She has a marked leukocytosis.  These problems have been present for  years.  Etiology is not entirely clear.  The differential is wide  includes peptic ulcer disease, inflammatory bowel disease, malignancy  (seems unlikely), adhesions causing bowel obstruction.   PLAN:  1. Recheck CBC.  2. Check and amylase and lipase as pancreatitis could be in the      picture.  3. Upper GI endoscopy.  4. Repeat C-MET.  5. If this is negative, perhaps a CT enterography would be      appropriate.  6. She had some flushing today which she thinks may be due to her      being nervous.  Urinary 5HIAA could be considered as well.   No change in medications at this time.   I have explained the risks, benefits, and indications for an upper GI  endoscopy.  She understands and agrees to proceed.  Colonoscopy could be  indicated as well at some point but I think CT enterography would make  more sense first.   HISTORY:  A 45 year old white woman for the past few years has had  spells where she thought she had a gastrointestinal virus or flu.  Recently had another problem last week and on October 11, 2006, [facility] and was seen by Dr. Cleta Alberts.  She had nausea and  vomiting and a little bit of diarrhea.  She had fairly intense left  upper quadrant pain.  The pain often wakes her at night.  She feels  bloated.  She is better but  still uncomfortable.  She had a white blood  cell count of 29,000 and hemoglobin of 16, and a platelet count of 278.  Sed rate was 16.  A UA was essentially negative except for trace  ketones, blood, and protein.  Similar problem in August 2007, was seen  [facility] with negative evaluation.  Her white blood cell count was  in the teens at that time but not as high as it was this time.  These  spells will last for a few days or so it seems.  Back in August 2007,  she had an ALT of 56 but otherwise normal C-MET as well.  Ova and  parasite exam was negative at that time.  She did have stool studies  with this recent visit and was told there was no blood.  I think she had  cultures sent too which I believe are pending.  We do not have those  results at this  time.  Stool cultures were negative in August as well.  I do not get a history of antibiotics, or chronic diarrhea, or diarrhea  suggestive of C. difficile.  She takes ibuprofen daily for headaches.  When she does get Phenergan, she and her husband relate that there is  relatively quick relief.   MEDICATIONS:  1. Ibuprofen 400 mg daily.  2. Ciprofloxacin 500 mg b.i.d. on a course of that.  3. Promethazine 25 mg p.r.n.  4. Paxil 20 mg daily.   DRUG ALLERGIES:  NONE KNOWN.   PAST MEDICAL HISTORY:  1. Depression.  2. Ventral hernia repair, age 17.  It sounds like it was incarcerated.  3. Tubal ligation, age 86.   FAMILY HISTORY:  Noncontributory.  Our review is negative.   SOCIAL HISTORY:  She is married.  She is a Dietitian at [employer].  One son, one daughter.  She smokes 10 cigarettes a day.  No other  tobacco.  No alcohol.  She complains of a chronic cough.  She feels like  she has a pulled muscle causing back pain.  Last menstrual period,  September 20, 2006.   REVIEW OF SYSTEMS:  Otherwise negative.   PHYSICAL EXAMINATION:  GENERAL:  Obese, pleasant, white woman.  VITAL SIGNS:  Height 5 feet 8 inches, weight 222  pounds, blood pressure  130/72, pulse 76.  EYES:  Anicteric.  ENT:  Normal mouth, posterior pharynx.  NECK:  Supple.  No thyromegaly or mass.  CHEST:  Clear.  HEART:  S1 S2.  No murmurs or gallops.  ABDOMEN:  She has mild to moderate tenderness in the left upper  quadrant.  It may be somewhat worse with muscle tension, though it is  difficult for me to say for sure.  There is an epigastric surgical scar  with a little bit of keloid formation.  The scar is fairly wide.  There  is no organomegaly or mass.  RECTAL:  Not performed today.  LYMPHATIC:  No neck or supraclavicular nodes.  LOWER EXTREMITIES:  Free of edema.  SKIN:  Flushed.  This blanches upon compression.  There is no rash to  speak of otherwise.  NEUROLOGIC:  She is alert and oriented x3 with an appropriate affect.   I have reviewed lab data, office notes from Dr. Cleta Alberts and previous urgent  care evaluations.   I appreciate the opportunity to care for this patient.     Jennifer Boop, MD,FACG  Electronically Signed    CEG/MedQ  DD: 10/17/2006  DT: 10/19/2006  Job #: [id number]   cc:   Brett Canales A. Cleta Alberts, M.D.  Marne A. Milinda Antis, MD

## 2010-12-24 NOTE — Assessment & Plan Note (Signed)
[facility]                         GASTROENTEROLOGY OFFICE NOTE   NAME:[name], [name]                    MRN:          [id number]  DATE:11/27/2006                            DOB:          1965/09/11    CHIEF COMPLAINT:  Follow up of vomiting and abdominal pain.   HISTORY OF PRESENT ILLNESS:  Her EGD demonstrated some duodenitis on  biopsy.  She had a benign fundic gland polyp.  She had the appearance of  gastritis, but biopsies were normal.  She took omeprazole 20 mg daily  for about a month and says she is all better.  However, she notes she  will have spells off and on and she has had these for years.   MEDICATIONS:  Medications listed and reviewed in the chart at this point  with Paxil and ibuprofen as the only medications.   PAST MEDICAL HISTORY:  See my note from October 17, 2006.  Note, there is  no diarrhea at this time, essentially feeling well.   PHYSICAL EXAMINATION:  VITAL SIGNS:  Weight 226 pounds, pulse 72, blood  pressure 110/74.   ASSESSMENT:  1. Duodenitis, questionably related to nonsteroidal anti-inflammatory      drugs.  There are no ulcers.  She had no Helicobacter pylori      present on the biopsies.  2. Episodic problems with left-sided abdominal pain, nausea, vomiting      and diarrhea over the years.  Etiology still not clear, although      she is well at this time.   PLAN:  Observe.  Use omeprazole if she starts to have symptoms again and  notify me if she has recurrent problems.  At this time, we have decided  not to perform further testing as she is much better.  She certainly  could have recurrent adhesions.  She goes for long spells of time where  she is well.     Iva Boop, MD,FACG  Electronically Signed    CEG/MedQ  DD: 11/27/2006  DT: 11/28/2006  Job #: [id number]   cc:   Marne A. Milinda Antis, MD  Stan Head. Cleta Alberts, M.D.

## 2011-05-09 LAB — CBC
HCT: 48.3 — ABNORMAL HIGH
Hemoglobin: 15.9 — ABNORMAL HIGH
MCHC: 33
MCV: 88.6
RBC: 5.45 — ABNORMAL HIGH

## 2011-08-27 ENCOUNTER — Ambulatory Visit (INDEPENDENT_AMBULATORY_CARE_PROVIDER_SITE_OTHER): Payer: Managed Care, Other (non HMO)

## 2011-08-27 DIAGNOSIS — M25539 Pain in unspecified wrist: Secondary | ICD-10-CM

## 2011-08-27 DIAGNOSIS — M659 Synovitis and tenosynovitis, unspecified: Secondary | ICD-10-CM

## 2011-10-04 ENCOUNTER — Encounter: Payer: Self-pay | Admitting: Internal Medicine

## 2011-10-07 DIAGNOSIS — A419 Sepsis, unspecified organism: Secondary | ICD-10-CM

## 2011-10-07 HISTORY — DX: Sepsis, unspecified organism: A41.9

## 2011-10-13 ENCOUNTER — Inpatient Hospital Stay (HOSPITAL_COMMUNITY): Payer: Managed Care, Other (non HMO)

## 2011-10-13 ENCOUNTER — Encounter (HOSPITAL_COMMUNITY): Payer: Self-pay | Admitting: *Deleted

## 2011-10-13 ENCOUNTER — Inpatient Hospital Stay (HOSPITAL_COMMUNITY)
Admission: AD | Admit: 2011-10-13 | Discharge: 2011-10-13 | Disposition: A | Discharge status: Home / Self Care | Payer: Managed Care, Other (non HMO) | Attending: Obstetrics and Gynecology | Admitting: Obstetrics and Gynecology

## 2011-10-13 ENCOUNTER — Other Ambulatory Visit: Payer: Self-pay | Admitting: Urology

## 2011-10-13 DIAGNOSIS — M545 Low back pain, unspecified: Secondary | ICD-10-CM | POA: Insufficient documentation

## 2011-10-13 DIAGNOSIS — N200 Calculus of kidney: Secondary | ICD-10-CM | POA: Insufficient documentation

## 2011-10-13 DIAGNOSIS — R1031 Right lower quadrant pain: Secondary | ICD-10-CM | POA: Insufficient documentation

## 2011-10-13 HISTORY — DX: Depression, unspecified: F32.A

## 2011-10-13 HISTORY — DX: Major depressive disorder, single episode, unspecified: F32.9

## 2011-10-13 LAB — URINALYSIS, ROUTINE W REFLEX MICROSCOPIC
Glucose, UA: NEGATIVE mg/dL
Ketones, ur: NEGATIVE mg/dL
pH: 6 (ref 5.0–8.0)

## 2011-10-13 LAB — COMPREHENSIVE METABOLIC PANEL
Alkaline Phosphatase: 75 U/L (ref 39–117)
BUN: 15 mg/dL (ref 6–23)
Chloride: 103 mEq/L (ref 96–112)
GFR calc Af Amer: >90 mL/min (ref >90)
Glucose, Bld: 145 mg/dL — ABNORMAL HIGH (ref 70–99)
Potassium: 3.9 mEq/L (ref 3.5–5.1)
Total Bilirubin: 0.2 mg/dL — ABNORMAL LOW (ref 0.3–1.2)
Total Protein: 6.5 g/dL (ref 6.0–8.3)

## 2011-10-13 LAB — CBC
HCT: 45.8 % (ref 36.0–46.0)
Hemoglobin: 15.3 g/dL — ABNORMAL HIGH (ref 12.0–15.0)
MCHC: 33.4 g/dL (ref 30.0–36.0)
MCV: 86.1 fL (ref 78.0–100.0)

## 2011-10-13 LAB — URINE MICROSCOPIC-ADD ON

## 2011-10-13 MED ORDER — LACTATED RINGERS IV SOLN
INTRAVENOUS | Status: DC
  Administered 2011-10-13: 10:00:00 via INTRAVENOUS

## 2011-10-13 MED ORDER — PROMETHAZINE HCL 25 MG/ML IJ SOLN
12.5000 mg | INTRAMUSCULAR | Status: AC
  Administered 2011-10-13: 12.5 mg via INTRAVENOUS
  Filled 2011-10-13: qty 1

## 2011-10-13 MED ORDER — HYDROMORPHONE HCL PF 1 MG/ML IJ SOLN
1.0000 mg | INTRAMUSCULAR | Status: AC
  Administered 2011-10-13: 1 mg via INTRAVENOUS
  Filled 2011-10-13: qty 1

## 2011-10-13 MED ORDER — IOHEXOL 300 MG/ML  SOLN
30.0000 mL | INTRAMUSCULAR | Status: AC
  Administered 2011-10-13: 30 mL via ORAL

## 2011-10-13 MED ORDER — HYDROMORPHONE HCL PF 1 MG/ML IJ SOLN
1.0000 mg | Freq: Once | INTRAMUSCULAR | Status: AC
  Administered 2011-10-13: 1 mg via INTRAMUSCULAR
  Filled 2011-10-13: qty 1

## 2011-10-13 MED ORDER — IOHEXOL 300 MG/ML  SOLN
100.0000 mL | Freq: Once | INTRAMUSCULAR | Status: AC | PRN
  Administered 2011-10-13: 100 mL via INTRAVENOUS

## 2011-10-13 MED ORDER — ONDANSETRON 4 MG PO TBDP
4.0000 mg | ORAL_TABLET | ORAL | Status: AC
  Administered 2011-10-13: 4 mg via ORAL
  Filled 2011-10-13: qty 1

## 2011-10-13 NOTE — Progress Notes (Signed)
Pain feels the same as torsed ovary, extreme rlq pain.

## 2011-10-13 NOTE — Pre-Procedure Instructions (Addendum)
Left message with Jennifer [name] to report to dr Brunilda Payor wbc=17.5 on 10-13-11 labs Cbc and cmet 10-13-11 in epic

## 2011-10-13 NOTE — Pre-Procedure Instructions (Deleted)
Left message with pam gibson to report to dr Brunilda Payor wbc 17.5 on labs 10-13-11.

## 2011-10-13 NOTE — ED Provider Notes (Addendum)
History     Chief Complaint  Patient presents with  . Abdominal Pain   HPI 46 y.o. G2P2 with RLQ pain starting this morning, low back pain x 1 week, somewhat improved after antibiotic for UTI, but much worse this morning. + nausea, no vomiting, + diarrhea today. H/O hysterectomy and L oophorectomy. H/O ovarian torsion, states this feels the same.    Past Medical History  Diagnosis Date  . Depression     Past Surgical History  Procedure Date  . Tubal ligation   . Oophorectomy     left ovary  . Vaginal hysterectomy   . Umbilical hernia repair   . Wisdom tooth extraction     Family History  Problem Relation Age of Onset  . Anesthesia problems Neg Hx   . Hypotension Neg Hx   . Malignant hyperthermia Neg Hx   . Pseudochol deficiency Neg Hx     History  Substance Use Topics  . Smoking status: Current Everyday Smoker -- 0.5 packs/day  . Smokeless tobacco: Never Used  . Alcohol Use: No    Allergies: No Known Allergies  Prescriptions prior to admission  Medication Sig Dispense Refill  . ibuprofen (ADVIL,MOTRIN) 200 MG tablet Take 600 mg by mouth every 6 (six) hours as needed. For pain      . PARoxetine (PAXIL) 20 MG tablet Take 20 mg by mouth every morning.        Review of Systems  Constitutional: Negative.   Respiratory: Negative.   Cardiovascular: Negative.   Gastrointestinal: Positive for nausea, abdominal pain and diarrhea. Negative for vomiting and constipation.  Genitourinary: Negative for dysuria, urgency, frequency, hematuria and flank pain.       Negative for vaginal bleeding, vaginal discharge  Musculoskeletal: Negative.   Neurological: Negative.   Psychiatric/Behavioral: Negative.    Physical Exam   Blood pressure 129/57, pulse 84, resp. rate 22.  Physical Exam  Nursing note and vitals reviewed. Constitutional: She is oriented to person, place, and time. She appears well-developed and well-nourished. She appears distressed.  Cardiovascular: Normal  rate and regular rhythm.   Respiratory: Effort normal. No respiratory distress.  GI: Soft. Bowel sounds are normal. She exhibits no distension and no mass. There is tenderness (RLQ). There is no rebound and no guarding.  Musculoskeletal: Normal range of motion.  Neurological: She is alert and oriented to person, place, and time.  Skin: Skin is warm and dry.  Psychiatric: She has a normal mood and affect.    MAU Course  Procedures  Results for orders placed during the hospital encounter of 10/13/11 (from the past 24 hour(s))  CBC     Status: Abnormal   Collection Time   10/13/11  6:35 AM      Component Value Range   WBC 17.5 (*) 4.0 - 10.5 (K/uL)   RBC 5.32 (*) 3.87 - 5.11 (MIL/uL)   Hemoglobin 15.3 (*) 12.0 - 15.0 (g/dL)   HCT 40.9  81.1 - 91.4 (%)   MCV 86.1  78.0 - 100.0 (fL)   MCH 28.8  26.0 - 34.0 (pg)   MCHC 33.4  30.0 - 36.0 (g/dL)   RDW 78.2  95.6 - 21.3 (%)   Platelets 304  150 - 400 (K/uL)  COMPREHENSIVE METABOLIC PANEL     Status: Abnormal   Collection Time   10/13/11  6:35 AM      Component Value Range   Sodium 137  135 - 145 (mEq/L)   Potassium 3.9  3.5 - 5.1 (  mEq/L)   Chloride 103  96 - 112 (mEq/L)   CO2 22  19 - 32 (mEq/L)   Glucose, Bld 145 (*) 70 - 99 (mg/dL)   BUN 15  6 - 23 (mg/dL)   Creatinine, Ser 1.19  0.50 - 1.10 (mg/dL)   Calcium 9.4  8.4 - 14.7 (mg/dL)   Total Protein 6.5  6.0 - 8.3 (g/dL)   Albumin 3.3 (*) 3.5 - 5.2 (g/dL)   AST 33  0 - 37 (U/L)   ALT 54 (*) 0 - 35 (U/L)   Alkaline Phosphatase 75  39 - 117 (U/L)   Total Bilirubin 0.2 (*) 0.3 - 1.2 (mg/dL)   GFR calc non Af Amer >90  >90 (mL/min)   GFR calc Af Amer >90  >90 (mL/min)  URINALYSIS, ROUTINE W REFLEX MICROSCOPIC     Status: Abnormal   Collection Time   10/13/11  6:50 AM      Component Value Range   Color, Urine YELLOW  YELLOW    APPearance HAZY (*) CLEAR    Specific Gravity, Urine 1.020  1.005 - 1.030    pH 6.0  5.0 - 8.0    Glucose, UA NEGATIVE  NEGATIVE (mg/dL)   Hgb urine  dipstick LARGE (*) NEGATIVE    Bilirubin Urine NEGATIVE  NEGATIVE    Ketones, ur NEGATIVE  NEGATIVE (mg/dL)   Protein, ur NEGATIVE  NEGATIVE (mg/dL)   Urobilinogen, UA 0.2  0.0 - 1.0 (mg/dL)   Nitrite POSITIVE (*) NEGATIVE    Leukocytes, UA LARGE (*) NEGATIVE   URINE MICROSCOPIC-ADD ON     Status: Abnormal   Collection Time   10/13/11  6:50 AM      Component Value Range   Squamous Epithelial / LPF RARE  RARE    WBC, UA 21-50  <3 (WBC/hpf)   RBC / HPF 3-6  <3 (RBC/hpf)   Bacteria, UA MANY (*) RARE     Assessment and Plan  RLQ pain and elevated WBC CT abd/pelvis with contrast  Dilaudid and Phenergan IV for pain Dr. Marcelle Overlie aware Care assumed by Sharen Counter, CNM  Jennifer [name],Jennifer [name] 10/13/2011, 8:14 AM   Ct Abdomen Pelvis W Contrast  10/13/2011  *RADIOLOGY REPORT*  Clinical Data: Right lower quadrant pain and flank pain; recent urinary tract infection  CT ABDOMEN AND PELVIS WITH CONTRAST  Technique:  Multidetector CT imaging of the abdomen and pelvis was performed following the standard protocol during bolus administration of intravenous contrast.  Contrast: 30mL OMNIPAQUE IOHEXOL 300 MG/ML IJ SOLN, OMNIPAQUE IOHEXOL 300 MG/ML IJ SOLN  Comparison: August 20, 2010  Findings: There is  right renal enlargement, along with hydronephrosis and hydroureter.  A 6 mm obstructing calculus is present within the distal right ureter.  There is mild dependent atelectasis within the lung bases.  There is diffuse fatty infiltration of the liver.  The gallbladder, spleen, pancreas, adrenal glands, left kidney, urinary bladder, osseous structures have a normal appearance.  The bowel has a normal appearance with no evidence of gross inflammation or obstruction.  The appendix is seen in the right lower quadrant, and has a normal appearance.  The right ovary is unremarkable in appearance.  There is no free fluid or adenopathy within the abdomen or pelvis.  No pneumoperitoneum.  IMPRESSION: There is a 6 mm  obstructing distal right ureteral calculus.  Original Report Authenticated By: Brandon Melnick, M.D.   A: Renal calculus obstructing distal right ureter  P: Called Dr Marcelle Overlie to review CT results.  Urology appointment made with Dr Brunilda Payor at [facility] for today at 2:30 pm NPO until appointment Return to MAU as needed   Sharen Counter, CNM

## 2011-10-13 NOTE — Progress Notes (Signed)
N. Frazier, CNM at bedside.  Assessment done and poc discussed with pt.  

## 2011-10-13 NOTE — Discharge Instructions (Signed)
Kidney Stones Kidney stones (ureteral lithiasis) are deposits that form inside your kidneys. The intense pain is caused by the stone moving through the urinary tract. When the stone moves, the ureter goes into spasm around the stone. The stone is usually passed in the urine.  CAUSES   A disorder that makes certain neck glands produce too much parathyroid hormone (primary hyperparathyroidism).   A buildup of uric acid crystals.   Narrowing (stricture) of the ureter.   A kidney obstruction present at birth (congenital obstruction).   Previous surgery on the kidney or ureters.   Numerous kidney infections.  SYMPTOMS   Feeling sick to your stomach (nauseous).   Throwing up (vomiting).   Blood in the urine (hematuria).   Pain that usually spreads (radiates) to the groin.   Frequency or urgency of urination.  DIAGNOSIS   Taking a history and physical exam.   Blood or urine tests.   Computerized X-ray scan (CT scan).   Occasionally, an examination of the inside of the urinary bladder (cystoscopy) is performed.  TREATMENT   Observation.   Increasing your fluid intake.   Surgery may be needed if you have severe pain or persistent obstruction.  The size, location, and chemical composition are all important variables that will determine the proper choice of action for you. Talk to your caregiver to better understand your situation so that you will minimize the risk of injury to yourself and your kidney.  HOME CARE INSTRUCTIONS   Drink enough water and fluids to keep your urine clear or pale yellow.   Strain all urine through the provided strainer. Keep all particulate matter and stones for your caregiver to see. The stone causing the pain may be as small as a grain of salt. It is very important to use the strainer each and every time you pass your urine. The collection of your stone will allow your caregiver to analyze it and verify that a stone has actually passed.   Only take  over-the-counter or prescription medicines for pain, discomfort, or fever as directed by your caregiver.   Make a follow-up appointment with your caregiver as directed.   Get follow-up X-rays if required. The absence of pain does not always mean that the stone has passed. It may have only stopped moving. If the urine remains completely obstructed, it can cause loss of kidney function or even complete destruction of the kidney. It is your responsibility to make sure X-rays and follow-ups are completed. Ultrasounds of the kidney can show blockages and the status of the kidney. Ultrasounds are not associated with any radiation and can be performed easily in a matter of minutes.  SEEK IMMEDIATE MEDICAL CARE IF:   Pain cannot be controlled with the prescribed medicine.   You have a fever.   The severity or intensity of pain increases over 18 hours and is not relieved by pain medicine.   You develop a new onset of abdominal pain.   You feel faint or pass out.  MAKE SURE YOU:   Understand these instructions.   Will watch your condition.   Will get help right away if you are not doing well or get worse.  Document Released: 07/25/2005 Document Revised: 07/14/2011 Document Reviewed: 11/20/2009 [facility] Patient Information 2012 [place], [place].  You have an appointment with Dr Brunilda Payor at [facility] today at 2:30 pm.  Please do not eat or drink anything until your appointment. [facility] [address] on 2nd floor

## 2011-10-13 NOTE — ED Notes (Signed)
LLeftwich-Kirby at bedside.

## 2011-10-13 NOTE — Pre-Procedure Instructions (Signed)
Patient instructed to take soap shower

## 2011-10-13 NOTE — Progress Notes (Signed)
Pt presents to [facility] for c/o lower left abdominal pain.  States back pain started about a week ago and abd pain started early this morning.  Was getting ready for work and thought it would go away and it has just gotten worse.

## 2011-10-13 NOTE — Progress Notes (Signed)
Lower back pain x 1 week, this am awakened with rt lower abd pain that radiates to rt lower back. Diarrhea x 1 this am, nauseated.

## 2011-10-14 ENCOUNTER — Encounter (HOSPITAL_COMMUNITY): Payer: Self-pay | Admitting: *Deleted

## 2011-10-14 ENCOUNTER — Ambulatory Visit (HOSPITAL_COMMUNITY)
Admission: RE | Admit: 2011-10-14 | Discharge: 2011-10-14 | Disposition: A | Discharge status: Home / Self Care | Payer: Managed Care, Other (non HMO) | Source: Ambulatory Visit | Attending: Urology | Admitting: Urology

## 2011-10-14 ENCOUNTER — Ambulatory Visit (HOSPITAL_COMMUNITY): Payer: Managed Care, Other (non HMO) | Admitting: *Deleted

## 2011-10-14 ENCOUNTER — Encounter (HOSPITAL_COMMUNITY)
Admission: RE | Disposition: A | Discharge status: Home / Self Care | Payer: Self-pay | Source: Ambulatory Visit | Attending: Urology

## 2011-10-14 ENCOUNTER — Ambulatory Visit (HOSPITAL_COMMUNITY): Payer: Managed Care, Other (non HMO)

## 2011-10-14 DIAGNOSIS — Z90710 Acquired absence of both cervix and uterus: Secondary | ICD-10-CM | POA: Insufficient documentation

## 2011-10-14 DIAGNOSIS — Z79899 Other long term (current) drug therapy: Secondary | ICD-10-CM | POA: Insufficient documentation

## 2011-10-14 DIAGNOSIS — N1330 Unspecified hydronephrosis: Secondary | ICD-10-CM | POA: Insufficient documentation

## 2011-10-14 DIAGNOSIS — N201 Calculus of ureter: Secondary | ICD-10-CM | POA: Insufficient documentation

## 2011-10-14 DIAGNOSIS — Z85828 Personal history of other malignant neoplasm of skin: Secondary | ICD-10-CM | POA: Insufficient documentation

## 2011-10-14 DIAGNOSIS — F329 Major depressive disorder, single episode, unspecified: Secondary | ICD-10-CM | POA: Insufficient documentation

## 2011-10-14 DIAGNOSIS — F3289 Other specified depressive episodes: Secondary | ICD-10-CM | POA: Insufficient documentation

## 2011-10-14 HISTORY — PX: CYSTOSCOPY W/ URETERAL STENT PLACEMENT: SHX1429

## 2011-10-14 SURGERY — CYSTOSCOPY, WITH RETROGRADE PYELOGRAM AND URETERAL STENT INSERTION
Anesthesia: General | Site: Ureter | Laterality: Right | Wound class: Clean Contaminated

## 2011-10-14 MED ORDER — METOCLOPRAMIDE HCL 5 MG/ML IJ SOLN
INTRAMUSCULAR | Status: DC | PRN
  Administered 2011-10-14: 10 mg via INTRAVENOUS

## 2011-10-14 MED ORDER — SCOPOLAMINE 1 MG/3DAYS TD PT72
MEDICATED_PATCH | TRANSDERMAL | Status: DC | PRN
  Administered 2011-10-14: 1 via TRANSDERMAL

## 2011-10-14 MED ORDER — LACTATED RINGERS IV SOLN
INTRAVENOUS | Status: DC
  Administered 2011-10-14: 15:00:00 via INTRAVENOUS

## 2011-10-14 MED ORDER — ONDANSETRON HCL 4 MG/2ML IJ SOLN
INTRAMUSCULAR | Status: DC | PRN
  Administered 2011-10-14: 4 mg via INTRAVENOUS

## 2011-10-14 MED ORDER — IOHEXOL 300 MG/ML  SOLN
INTRAMUSCULAR | Status: AC
  Filled 2011-10-14: qty 1

## 2011-10-14 MED ORDER — MUPIROCIN 2 % EX OINT
TOPICAL_OINTMENT | CUTANEOUS | Status: AC
  Filled 2011-10-14: qty 22

## 2011-10-14 MED ORDER — IOHEXOL 300 MG/ML  SOLN
INTRAMUSCULAR | Status: DC | PRN
  Administered 2011-10-14: 10 mL

## 2011-10-14 MED ORDER — LACTATED RINGERS IV SOLN
INTRAVENOUS | Status: DC | PRN
  Administered 2011-10-14: 10:00:00 via INTRAVENOUS

## 2011-10-14 MED ORDER — FENTANYL CITRATE 0.05 MG/ML IJ SOLN
INTRAMUSCULAR | Status: DC | PRN
  Administered 2011-10-14: 25 ug via INTRAVENOUS
  Administered 2011-10-14: 50 ug via INTRAVENOUS
  Administered 2011-10-14: 25 ug via INTRAVENOUS
  Administered 2011-10-14 (×2): 50 ug via INTRAVENOUS

## 2011-10-14 MED ORDER — DEXAMETHASONE SODIUM PHOSPHATE 4 MG/ML IJ SOLN
INTRAMUSCULAR | Status: DC | PRN
  Administered 2011-10-14: 10 mg via INTRAVENOUS

## 2011-10-14 MED ORDER — PROPOFOL 10 MG/ML IV EMUL
INTRAVENOUS | Status: DC | PRN
  Administered 2011-10-14: 200 mg via INTRAVENOUS

## 2011-10-14 MED ORDER — DIATRIZOATE MEGLUMINE 30 % UR SOLN: URETHRAL | Status: DC | PRN

## 2011-10-14 MED ORDER — SCOPOLAMINE 1 MG/3DAYS TD PT72
MEDICATED_PATCH | TRANSDERMAL | Status: AC
  Filled 2011-10-14: qty 1

## 2011-10-14 MED ORDER — LACTATED RINGERS IV SOLN
INTRAVENOUS | Status: DC
  Administered 2011-10-14: 1000 mL via INTRAVENOUS

## 2011-10-14 MED ORDER — ACETAMINOPHEN 10 MG/ML IV SOLN
INTRAVENOUS | Status: AC
  Filled 2011-10-14: qty 100

## 2011-10-14 MED ORDER — PROMETHAZINE HCL 25 MG/ML IJ SOLN: 6.2500 mg | INTRAMUSCULAR | Status: DC | PRN

## 2011-10-14 MED ORDER — FENTANYL CITRATE 0.05 MG/ML IJ SOLN: 25.0000 ug | INTRAMUSCULAR | Status: DC | PRN

## 2011-10-14 MED ORDER — CEFAZOLIN SODIUM-DEXTROSE 2-3 GM-% IV SOLR
INTRAVENOUS | Status: AC
  Filled 2011-10-14: qty 50

## 2011-10-14 MED ORDER — DROPERIDOL 2.5 MG/ML IJ SOLN
INTRAMUSCULAR | Status: DC | PRN
  Administered 2011-10-14: 0.625 mg via INTRAVENOUS

## 2011-10-14 MED ORDER — LIDOCAINE HCL (CARDIAC) 20 MG/ML IV SOLN
INTRAVENOUS | Status: DC | PRN
  Administered 2011-10-14: 100 mg via INTRAVENOUS

## 2011-10-14 MED ORDER — MIDAZOLAM HCL 5 MG/5ML IJ SOLN
INTRAMUSCULAR | Status: DC | PRN
  Administered 2011-10-14: 2 mg via INTRAVENOUS

## 2011-10-14 MED ORDER — CEFAZOLIN SODIUM 1-5 GM-% IV SOLN
1.0000 g | INTRAVENOUS | Status: AC
  Administered 2011-10-14: 2 g via INTRAVENOUS

## 2011-10-14 MED ORDER — ACETAMINOPHEN 10 MG/ML IV SOLN
INTRAVENOUS | Status: DC | PRN
  Administered 2011-10-14: 1000 mg via INTRAVENOUS

## 2011-10-14 MED ORDER — STERILE WATER FOR IRRIGATION IR SOLN
Status: DC | PRN
  Administered 2011-10-14: 1000 mL

## 2011-10-14 SURGICAL SUPPLY — 24 items
ADAPTER CATH URET PLST 4-6FR (CATHETERS) ×2 IMPLANT
ADPR CATH URET STRL DISP 4-6FR (CATHETERS) ×1
BAG URO CATCHER STRL LF (DRAPE) ×2 IMPLANT
BASKET ZERO TIP NITINOL 2.4FR (BASKET) ×1 IMPLANT
BSKT STON RTRVL ZERO TP 2.4FR (BASKET) ×1
CATH INTERMIT  6FR 70CM (CATHETERS) IMPLANT
CATH URET 5FR 28IN CONE TIP (BALLOONS) ×1
CATH URET 5FR 28IN OPEN ENDED (CATHETERS) ×2 IMPLANT
CATH URET 5FR 70CM CONE TIP (BALLOONS) IMPLANT
CLOTH BEACON ORANGE TIMEOUT ST (SAFETY) ×2 IMPLANT
DRAPE CAMERA CLOSED 9X96 (DRAPES) ×2 IMPLANT
GLOVE BIOGEL M 7.0 STRL (GLOVE) ×2 IMPLANT
GLOVE SURG SS PI 8.0 STRL IVOR (GLOVE) ×2 IMPLANT
GOWN PREVENTION PLUS XLARGE (GOWN DISPOSABLE) ×2 IMPLANT
GOWN STRL NON-REIN LRG LVL3 (GOWN DISPOSABLE) ×4 IMPLANT
GOWN STRL REIN XL XLG (GOWN DISPOSABLE) ×2 IMPLANT
LASER FIBER DISP (UROLOGICAL SUPPLIES) ×1 IMPLANT
MANIFOLD NEPTUNE II (INSTRUMENTS) ×2 IMPLANT
MARKER SKIN DUAL TIP RULER LAB (MISCELLANEOUS) ×2 IMPLANT
NS IRRIG 1000ML POUR BTL (IV SOLUTION) ×2 IMPLANT
PACK CYSTO (CUSTOM PROCEDURE TRAY) ×2 IMPLANT
STENT CONTOUR 6FRX24X.038 (STENTS) ×1 IMPLANT
TUBING CONNECTING 10 (TUBING) ×2 IMPLANT
WIRE COONS/BENSON .038X145CM (WIRE) ×2 IMPLANT

## 2011-10-14 NOTE — Anesthesia Postprocedure Evaluation (Signed)
  Anesthesia Post-op Note  Patient: Jennifer [name]  Procedure(s) Performed: Procedure(s) (LRB): CYSTOSCOPY WITH RETROGRADE PYELOGRAM/URETERAL STENT PLACEMENT (Right) HOLMIUM LASER APPLICATION (Right)  Patient Location: PACU  Anesthesia Type: General  Level of Consciousness: awake and alert   Airway and Oxygen Therapy: Patient Spontanous Breathing  Post-op Pain: mild  Post-op Assessment: Post-op Vital signs reviewed, Patient's Cardiovascular Status Stable, Respiratory Function Stable, Patent Airway and No signs of Nausea or vomiting  Post-op Vital Signs: stable  Complications: No apparent anesthesia complications

## 2011-10-14 NOTE — Op Note (Signed)
Jennifer [name] is a 46 y.o.   10/14/2011  Pre Op Diagnosis: Right distal ureteral calculus, right hydronephrosis.  Post Op Diagnosis: Same.  Procedure done: Cystoscopy, right retrograde pyelogram, ureteroscopy with holmium laser of the ureteral calculus , stone extraction and insertion of double-J stent.  Surgeon: Wendie Simmer. [name]  Anesthesia: General  Indication: Patient is a 46 years old female who woke up yesterday morning with sudden onset of severe right flank pain associated with nausea and vomiting. She went to the [facility] emergency room. CT scan showed moderate right hydronephrosis secondary to a 6 mm distal ureteral calculus. She was given IV analgesics and was sent home. She came to the office yesterday afternoon complaining of severe pain with nausea. She was given IM morphine and Phenergan. She felt better. However because of the size of the stone she was advised to have it removed. The procedure, the risks and benefits were explained to her and her husband. They understood and gave informed consent.  Procedure: The patient was identified by her wrist band and proper timeout was taken.  Under general anesthesia, she was prepped and draped and placed in the dorsolithotomy position. A panendoscope was inserted in the bladder. Urine was sent for culture and sensitivity. The bladder mucosa is normal. There is no stone or tumor in the bladder. The ureteral orifices are in normal position and shape.  Retrograde pyelogram:  A cone-tip catheter was passed through the cystoscope and the right ureteral orifice. Contrast was then injected through the cone-tip catheter. There is a filling defect in the distal ureter consistent with the known ureteral stone. The ureter proximal to that filling defect is moderately dilated. The calyces and renal pelvis are also dilated. The cone-tip catheter was removed. A sensor wire was passed through the cystoscope and the right ureter. The cystoscope  was removed.  A semirigid ureteroscope was passed in the bladder and through the right ureteral orifice and advanced the distal ureter. The stone was visualized. With a 365 microfiber holmium laser the stone was fragmented in multiple small fragments. Several stone fragments were then removed with a Nitinol basket. There are some minute fragments in the ureter that she would pass spontaneously.  Contrast was then injected through the ureteroscope. There is no filling defect in the ureter, no extravasation of contrast. The ureteroscope was then removed.  The sensor wire was then backloaded into the cystoscope and a #[id number] French-[id number] double-J stent was passed over the sensor wire. The sensor wire was removed. The proximal curl of the double-J stent is in the renal pelvis, the distal curl is in the bladder. The bladder was then emptied and the cystoscope removed.  The patient tolerated the procedure well and left the OR in satisfactory condition to postanesthesia care unit.  CC: Dr. Richarda Overlie

## 2011-10-14 NOTE — Anesthesia Preprocedure Evaluation (Addendum)
Anesthesia Evaluation  Patient identified by MRN, date of birth, ID band Patient awake    Reviewed: Allergy & Precautions, H&P , NPO status , Patient's Chart, lab work & pertinent test results  Airway Mallampati: II TM Distance: >3 FB Neck ROM: Full    Dental No notable dental hx.    Pulmonary Current Smoker,  breath sounds clear to auscultation  Pulmonary exam normal       Cardiovascular negative cardio ROS  Rhythm:Regular Rate:Normal     Neuro/Psych PSYCHIATRIC DISORDERS Depression negative neurological ROS     GI/Hepatic negative GI ROS, Neg liver ROS,   Endo/Other  negative endocrine ROS  Renal/GU negative Renal ROS  negative genitourinary   Musculoskeletal negative musculoskeletal ROS (+)   Abdominal   Peds negative pediatric ROS (+)  Hematology negative hematology ROS (+)   Anesthesia Other Findings   Reproductive/Obstetrics negative OB ROS                          Anesthesia Physical Anesthesia Plan  ASA: II  Anesthesia Plan: General   Post-op Pain Management:    Induction: Intravenous  Airway Management Planned: LMA  Additional Equipment:   Intra-op Plan:   Post-operative Plan: Extubation in OR  Informed Consent: I have reviewed the patients History and Physical, chart, labs and discussed the procedure including the risks, benefits and alternatives for the proposed anesthesia with the patient or authorized representative who has indicated his/her understanding and acceptance.   Dental advisory given  Plan Discussed with: CRNA  Anesthesia Plan Comments:         Anesthesia Quick Evaluation

## 2011-10-14 NOTE — Transfer of Care (Signed)
Immediate Anesthesia Transfer of Care Note  Patient: Jennifer [name]  Procedure(s) Performed: Procedure(s) (LRB): CYSTOSCOPY WITH RETROGRADE PYELOGRAM/URETERAL STENT PLACEMENT (Right) HOLMIUM LASER APPLICATION (Right)  Patient Location: PACU  Anesthesia Type: General  Level of Consciousness: awake, alert  and oriented  Airway & Oxygen Therapy: Patient Spontanous Breathing and Patient connected to face mask oxygen  Post-op Assessment: Report given to PACU RN and Post -op Vital signs reviewed and stable  Post vital signs: Reviewed and stable  Complications: No apparent anesthesia complications

## 2011-10-14 NOTE — Preoperative (Signed)
Beta Blockers   Reason not to administer Beta Blockers:Not Applicable 

## 2011-10-14 NOTE — H&P (Signed)
History of Present Illness  Jennifer [name] is seen at Dr Gerlene Burdock [facility].  The patient has been having low back pain on and off for a week.  Her gynecologist gave her antibiotics and the pain went away.  This morning she woke up with severe right flank and right lower quadrant associated with nausea.  She went to the ER at [facility].  CT scan showed moderate right hydronephrosis secondary to a 6 mm distal right ureteral calculus.  She was given analgesics and felt better.  She was discharged home.  She came to the office this afternoon with severe right lower quadrant pain associated with nausea.  She voids well, denies frequency, urgency, dysuria. hematuria.   Past Medical History Problems  1. History of  Depression 311 2. History of  Skin Cancer V10.83  Surgical History Problems  1. History of  Hysterectomy V45.77 2. History of  Ovarian Surgery Right 3. History of  Tubal Ligation V25.2 4. History of  Ventral Hernia Repair  Current Meds 1. Paxil 20 MG Oral Tablet; Therapy: (Recorded:07Mar2013) to  Allergies Medication  1. No Known Drug Allergies  Family History Problems  1. Family history of  Family Health Status - Father's Age 66. Family history of  Family Health Status - Mother's Age 59. Family history of  Family Health Status Number Of Children  Social History Problems    Caffeine Use   Marital History - Currently Married   Tobacco Use 305.1 Denied    History of  Alcohol Use  Review of Systems Genitourinary, constitutional, skin, eye, otolaryngeal, hematologic/lymphatic, cardiovascular, pulmonary, endocrine, musculoskeletal, gastrointestinal, neurological and psychiatric system(s) were reviewed and pertinent findings if present are noted.  Genitourinary: urinary frequency, nocturia and incontinence.  Gastrointestinal: nausea, abdominal pain and diarrhea.  Constitutional: fever, night sweats and feeling tired (fatigue).  Musculoskeletal: back pain and joint  pain.  Neurological: dizziness and headache.  Psychiatric: depression.    Vitals Vital Signs [Data Includes: Last 1 Day]  07Mar2013 02:46PM  BMI Calculated: 36.37 BSA Calculated: 2.21 Height: 5 ft 8 in Weight: 240 lb  Blood Pressure: 136 / 77 Heart Rate: 99 Respiration: 20  Physical Exam Constitutional: Well nourished and well developed . No acute distress.  ENT:. The ears and nose are normal in appearance.  Neck: The appearance of the neck is normal and no neck mass is present.  Pulmonary: No respiratory distress and normal respiratory rhythm and effort.  Cardiovascular: Heart rate and rhythm are normal . No peripheral edema.  Abdomen: The abdomen is soft and nontender. No masses are palpated. Moderate tenderness in the RLQ is present. moderate right CVA tenderness no CVA tenderness. No hernias are palpable. No hepatosplenomegaly noted.  Genitourinary: Examination of the external genitalia shows normal female external genitalia. The urethra is normal in appearance. No cystocele is identified. The cervix is is absent. The uterus is absent.  Lymphatics: The femoral and inguinal nodes are not enlarged or tender.  Skin: Normal skin turgor, no visible rash and no visible skin lesions.  Neuro/Psych:. Mood and affect are appropriate.    Results/Data  Urine [Data Includes: Last 1 Day]   07Mar2013  COLOR YELLOW   APPEARANCE CLOUDY   SPECIFIC GRAVITY <1.005   pH 6.5   GLUCOSE NEG mg/dL  BILIRUBIN NEG   KETONE NEG mg/dL  BLOOD LARGE   PROTEIN NEG mg/dL  UROBILINOGEN 0.2 mg/dL  NITRITE POS   LEUKOCYTE ESTERASE MOD   SQUAMOUS EPITHELIAL/HPF NONE SEEN   WBC 7-10 WBC/hpf  RBC 7-10 RBC/hpf  BACTERIA FEW   CRYSTALS NONE SEEN   CASTS NONE SEEN     I independently reviewed the CT scan and the findings are as noted above.  13 Oct 2011 2:15 PM   UA With REFLEX       COLOR YELLOW       APPEARANCE CLOUDY       SPECIFIC GRAVITY <1.005       pH 6.5       GLUCOSE NEG       BILIRUBIN  NEG       KETONE NEG       BLOOD LARGE       PROTEIN NEG       UROBILINOGEN 0.2       NITRITE POS       LEUKOCYTE ESTERASE MOD       SQUAMOUS EPITHELIAL/HPF NONE SEEN       WBC 7-10       CRYSTALS NONE SEEN       CASTS NONE SEEN       RBC 7-10       BACTERIA FEW    Assessment Assessed  1. Distal Ureteral Stone On The Right 592.1 2. Hydronephrosis 591 3. Urinary Tract Infection 599.0  Plan      Urine culture.  Morphine 15 mgm, Phenergan 25 mgm IM.  The stone is relatively large.  She may not pass it spontaneously.  Will schedule her for cystoscopy, right retrograde pyelogram, ureteroscopy, holmium laser, JJ stent. The risks, benefits of the procedure were explained to her and her husband.  The risks include but are not limited to hemorrhage, infection, ureteral injury, inability to extract the stone.  They understand and wish to proceed. UA With REFLEX  Status: Resulted - Requires Verification  Done: 01Jan0001 12:00AM Ordered Today; For: Health Maintenance (V70.0); Ordered By: Su Grand  Due: 09Mar2013 Marked Important; Last Updated By: Emmaline Life   Signatures  CC: Dr Richarda Overlie  Electronically signed by : Su Grand, M.D.; Oct 13 2011  6:57PM

## 2011-10-14 NOTE — Discharge Instructions (Signed)
Ureteral Stent  A ureteral stent is a soft plastic tube with multiple holes. The stent is inserted into a ureter to help drain urine from the kidney into the bladder. The tube has a coil on each end to keep it from falling out. One end stays in the kidney. The other end stays in the bladder. A stent cannot be seen from the outside. Usually it does not keep you from going about normal routines.  A ureteral stent is used to bypass a blockage in your kidney or ureter. This blockage can be caused by kidney stones, scar tissue, pregnancy, or other causes. It can also be used during treatment to remove a kidney stone or to let a ureter heal after surgery. The stent allows urine to drain from the kidney into the bladder. It is most often taken out after the blockage has been removed or the ureter has healed. If a stent is needed for a long time, it will be changed every few months.  INSERTING THE STENT  Your stent is put in by a urologist. This is a medical doctor trained for treating genitourinary (kidney, ureter and bladder) problems. Before your stent is put in, your caregiver may order x-rays or other imaging tests of your kidneys and ureters. The stent is inserted in a hospital or same day surgical center. You can anticipate going home the same day.  PROCEDURE   A special x-ray machine called a fluoroscope is used to guide the insertion of your stent. This allows your doctor to make sure the stent is in the correct place.   First you are given anesthesia to keep you comfortable.   Then your doctor inserts a special lighted instrument called a cystoscope into your bladder. This allows your doctor to see the opening to the ureter.   A thin wire is carefully threaded into the bladder and up the ureter. The stent is inserted over the wire and the wire is then removed.  HOME CARE INSTRUCTIONS    While the stent is in place, you may feel some discomfort. Certain movements may trigger pain or a feeling that you need to  urinate. Your caregiver may give you pain medication. Only take over-the-counter or prescription medicines for pain, discomfort, or fever as directed by your caregiver. Do not take aspirin as this can make bleeding worse.   You may be given medications to prevent infection or bladder spasms. Be sure to take all medications as directed.   Drink plenty of fluids.   You may have small amounts of bleeding causing your urine to be slightly red. This is nothing to be concerned about.  REMOVAL OF THE STENT  Your stent is left in until the blockage is resolved. This may take two weeks or longer. Before the stent is removed, you may have an x-ray make sure the ureter is open. The stent can be removed by your caregiver in the office. Medications may be given for comfort. Be sure to keep all follow-up appointments so your caregiver can check that you are healing properly.  SEEK IMMEDIATE MEDICAL CARE IF:    Your urine is dark red or has blood clots.   You are incontinent (leaking urine).   You have an oral temperature above 102 F (38.9 C), chills, nausea (feeling sick to your stomach), or vomiting.   Your pain is not relieved by pain medication. Do not take aspirin as this can make bleeding worse.   The end of the stent comes   out of the urethra.  Document Released: 07/22/2000 Document Revised: 07/14/2011 Document Reviewed: 07/21/2008  [employer] Patient Information 2012 [employer].

## 2011-10-15 LAB — URINE CULTURE: Colony Count: >=100000

## 2011-10-16 LAB — URINE CULTURE: Colony Count: >=100000

## 2011-10-23 ENCOUNTER — Encounter (HOSPITAL_COMMUNITY): Payer: Self-pay

## 2011-10-23 ENCOUNTER — Inpatient Hospital Stay (HOSPITAL_COMMUNITY)
Admission: EM | Admit: 2011-10-23 | Discharge: 2011-10-26 | DRG: 689 | Disposition: A | Discharge status: Home / Self Care | Payer: Managed Care, Other (non HMO) | Attending: Family Medicine | Admitting: Family Medicine

## 2011-10-23 ENCOUNTER — Emergency Department (HOSPITAL_COMMUNITY): Payer: Managed Care, Other (non HMO)

## 2011-10-23 DIAGNOSIS — R509 Fever, unspecified: Secondary | ICD-10-CM | POA: Diagnosis present

## 2011-10-23 DIAGNOSIS — E871 Hypo-osmolality and hyponatremia: Secondary | ICD-10-CM | POA: Diagnosis present

## 2011-10-23 DIAGNOSIS — Z87442 Personal history of urinary calculi: Secondary | ICD-10-CM

## 2011-10-23 DIAGNOSIS — R112 Nausea with vomiting, unspecified: Secondary | ICD-10-CM | POA: Diagnosis present

## 2011-10-23 DIAGNOSIS — R197 Diarrhea, unspecified: Secondary | ICD-10-CM | POA: Diagnosis present

## 2011-10-23 DIAGNOSIS — L270 Generalized skin eruption due to drugs and medicaments taken internally: Secondary | ICD-10-CM | POA: Diagnosis present

## 2011-10-23 DIAGNOSIS — F3289 Other specified depressive episodes: Secondary | ICD-10-CM | POA: Diagnosis present

## 2011-10-23 DIAGNOSIS — N390 Urinary tract infection, site not specified: Principal | ICD-10-CM | POA: Diagnosis present

## 2011-10-23 DIAGNOSIS — A419 Sepsis, unspecified organism: Secondary | ICD-10-CM | POA: Diagnosis present

## 2011-10-23 DIAGNOSIS — E860 Dehydration: Secondary | ICD-10-CM | POA: Diagnosis present

## 2011-10-23 DIAGNOSIS — D72829 Elevated white blood cell count, unspecified: Secondary | ICD-10-CM | POA: Diagnosis present

## 2011-10-23 DIAGNOSIS — T360X5A Adverse effect of penicillins, initial encounter: Secondary | ICD-10-CM | POA: Diagnosis present

## 2011-10-23 DIAGNOSIS — F329 Major depressive disorder, single episode, unspecified: Secondary | ICD-10-CM | POA: Diagnosis present

## 2011-10-23 DIAGNOSIS — R109 Unspecified abdominal pain: Secondary | ICD-10-CM | POA: Diagnosis present

## 2011-10-23 LAB — URINALYSIS, ROUTINE W REFLEX MICROSCOPIC
Glucose, UA: NEGATIVE mg/dL
pH: 8 (ref 5.0–8.0)

## 2011-10-23 LAB — CBC
MCH: 29.3 pg (ref 26.0–34.0)
Platelets: 278 10*3/uL (ref 150–400)
RBC: 5.33 MIL/uL — ABNORMAL HIGH (ref 3.87–5.11)

## 2011-10-23 LAB — BASIC METABOLIC PANEL
CO2: 25 mEq/L (ref 19–32)
Calcium: 8.7 mg/dL (ref 8.4–10.5)
GFR calc Af Amer: 82 mL/min — ABNORMAL LOW (ref >90)
GFR calc non Af Amer: 71 mL/min — ABNORMAL LOW (ref >90)
Sodium: 130 mEq/L — ABNORMAL LOW (ref 135–145)

## 2011-10-23 LAB — URINE MICROSCOPIC-ADD ON

## 2011-10-23 MED ORDER — FENTANYL CITRATE 0.05 MG/ML IJ SOLN
50.0000 ug | Freq: Once | INTRAMUSCULAR | Status: AC
  Administered 2011-10-23: 50 ug via INTRAVENOUS

## 2011-10-23 MED ORDER — ACETAMINOPHEN 325 MG PO TABS
650.0000 mg | ORAL_TABLET | Freq: Four times a day (QID) | ORAL | Status: DC | PRN
  Administered 2011-10-23 – 2011-10-24 (×2): 650 mg via ORAL
  Filled 2011-10-23 (×2): qty 2

## 2011-10-23 MED ORDER — VANCOMYCIN HCL IN DEXTROSE 1-5 GM/200ML-% IV SOLN
1000.0000 mg | Freq: Once | INTRAVENOUS | Status: AC
  Administered 2011-10-23: 1000 mg via INTRAVENOUS
  Filled 2011-10-23: qty 200

## 2011-10-23 MED ORDER — FENTANYL CITRATE 0.05 MG/ML IJ SOLN
INTRAMUSCULAR | Status: AC
  Filled 2011-10-23: qty 2

## 2011-10-23 MED ORDER — SODIUM CHLORIDE 0.9 % IJ SOLN: 3.0000 mL | INTRAMUSCULAR | Status: AC

## 2011-10-23 MED ORDER — SODIUM CHLORIDE 0.9 % IV BOLUS (SEPSIS)
1000.0000 mL | Freq: Once | INTRAVENOUS | Status: AC
  Administered 2011-10-23: 1000 mL via INTRAVENOUS

## 2011-10-23 MED ORDER — ACETAMINOPHEN 650 MG RE SUPP: 650.0000 mg | RECTAL | Status: DC | PRN

## 2011-10-23 MED ORDER — VANCOMYCIN HCL IN DEXTROSE 1-5 GM/200ML-% IV SOLN
1000.0000 mg | Freq: Three times a day (TID) | INTRAVENOUS | Status: DC
  Administered 2011-10-24 – 2011-10-25 (×4): 1000 mg via INTRAVENOUS
  Filled 2011-10-23 (×5): qty 200

## 2011-10-23 MED ORDER — PIPERACILLIN-TAZOBACTAM 3.375 G IVPB
3.3750 g | Freq: Once | INTRAVENOUS | Status: AC
  Administered 2011-10-24: 3.375 g via INTRAVENOUS
  Filled 2011-10-23: qty 50

## 2011-10-23 MED ORDER — PIPERACILLIN-TAZOBACTAM 3.375 G IVPB
3.3750 g | Freq: Three times a day (TID) | INTRAVENOUS | Status: DC
  Administered 2011-10-24: 3.375 g via INTRAVENOUS
  Filled 2011-10-23 (×5): qty 50

## 2011-10-23 MED ORDER — ONDANSETRON HCL 4 MG/2ML IJ SOLN
4.0000 mg | Freq: Once | INTRAMUSCULAR | Status: AC
  Administered 2011-10-23: 4 mg via INTRAVENOUS

## 2011-10-23 MED ORDER — SODIUM CHLORIDE 0.9 % IV SOLN
Freq: Once | INTRAVENOUS | Status: AC
  Administered 2011-10-23: via INTRAVENOUS

## 2011-10-23 MED ORDER — ONDANSETRON HCL 4 MG/2ML IJ SOLN
INTRAMUSCULAR | Status: AC
  Administered 2011-10-23: 4 mg via INTRAVENOUS
  Filled 2011-10-23: qty 2

## 2011-10-23 MED ORDER — IOHEXOL 300 MG/ML  SOLN
100.0000 mL | Freq: Once | INTRAMUSCULAR | Status: AC | PRN
  Administered 2011-10-23: 100 mL via INTRAVENOUS

## 2011-10-23 MED ORDER — ONDANSETRON HCL 4 MG/2ML IJ SOLN
4.0000 mg | INTRAMUSCULAR | Status: AC
  Administered 2011-10-23 – 2011-10-24 (×2): 4 mg via INTRAVENOUS
  Filled 2011-10-23 (×2): qty 2

## 2011-10-23 NOTE — ED Notes (Signed)
Patient reports that she had kidney stent removed on Friday and developed fever and vomiting Friday evening. Reports general abdominal pain for same, pale on arrival

## 2011-10-23 NOTE — ED Notes (Signed)
Patient is resting comfortably. 

## 2011-10-23 NOTE — ED Provider Notes (Signed)
History     CSN: [id number]  Arrival date & time 10/23/11  1457   First MD Initiated Contact with Patient 10/23/11 1535      Chief Complaint  Patient presents with  . Fever    (Consider location/radiation/quality/duration/timing/severity/associated sxs/prior treatment) HPI  Past Medical History  Diagnosis Date  . Depression   . kidney stone     Past Surgical History  Procedure Date  . Tubal ligation   . Oophorectomy     left ovary  . Vaginal hysterectomy   . Umbilical hernia repair   . Wisdom tooth extraction     Family History  Problem Relation Age of Onset  . Anesthesia problems Neg Hx   . Hypotension Neg Hx   . Malignant hyperthermia Neg Hx   . Pseudochol deficiency Neg Hx     History  Substance Use Topics  . Smoking status: Current Everyday Smoker -- 0.5 packs/day  . Smokeless tobacco: Never Used  . Alcohol Use: No    OB History    Grav Para Term Preterm Abortions TAB SAB Ect Mult Living   2 2        2       Review of Systems  Allergies  Macrobid  Home Medications   Current Outpatient Rx  Name Route Sig Dispense Refill  . HYDROMORPHONE HCL 4 MG PO TABS Oral Take 4 mg by mouth every 4 (four) hours as needed. For pain.    . IBUPROFEN 200 MG PO TABS Oral Take 200-400 mg by mouth every 6 (six) hours as needed. For pain    . OXYCODONE-ACETAMINOPHEN 5-325 MG PO TABS Oral Take 1-2 tablets by mouth every 4 (four) hours as needed. For pain.    Marland Kitchen PAROXETINE HCL 20 MG PO TABS Oral Take 20 mg by mouth every morning.    Marland Kitchen PROMETHAZINE HCL 25 MG PO TABS Oral Take 25 mg by mouth every 6 (six) hours as needed. For nausea      BP 119/52  Pulse 75  Temp(Src) 100.5 F (38.1 C) (Oral)  Resp 22  Ht 5\' 8"  (1.727 m)  Wt 225 lb (102.059 kg)  BMI 34.21 kg/m2  SpO2 99%  Physical Exam  ED Course  Procedures (including critical care time)  Labs Reviewed  URINALYSIS, ROUTINE W REFLEX MICROSCOPIC - Abnormal; Notable for the following:    Color, Urine AMBER  (*) BIOCHEMICALS MAY BE AFFECTED BY COLOR   APPearance CLOUDY (*)    Ketones, ur TRACE (*)    Protein, ur 30 (*)    Leukocytes, UA SMALL (*)    All other components within normal limits  CBC - Abnormal; Notable for the following:    WBC 21.2 (*)    RBC 5.33 (*)    Hemoglobin 15.6 (*)    All other components within normal limits  BASIC METABOLIC PANEL - Abnormal; Notable for the following:    Sodium 130 (*)    Chloride 92 (*)    Glucose, Bld 173 (*)    GFR calc non Af Amer 71 (*)    GFR calc Af Amer 82 (*)    All other components within normal limits  URINE MICROSCOPIC-ADD ON  LACTIC ACID, PLASMA  CULTURE, BLOOD (ROUTINE X 2)  CULTURE, BLOOD (ROUTINE X 2)  URINE CULTURE   Ct Abdomen Pelvis W Contrast  10/23/2011  *RADIOLOGY REPORT*  Clinical Data: Abdominal pain with nausea, vomiting, and fever. Recent right ureteral stone.  CT ABDOMEN AND PELVIS WITH CONTRAST  Technique:  Multidetector CT imaging of the abdomen and pelvis was performed following the standard protocol during bolus administration of intravenous contrast.  Contrast: OMNIPAQUE IOHEXOL 300 MG/ML IJ SOLN  Comparison: 10/13/2011  Findings: New slight bibasilar atelectasis.  The patient has mild hepatomegaly with hepatic steatosis.  The spleen, pancreas, adrenal glands, and kidneys are normal.  There is slight dilatation of the right ureter but the previously noted right ureteral stone has passed.  There is no obstruction.  I suspect the patient has slight residual edema of the mucosa of the distal ureter.  The bowel is normal including the terminal ileum and appendix. Uterus and left ovary appear to have been removed.  Right ovary is normal.  No osseous abnormality.  IMPRESSION: No acute abnormalities.  Slight residual prominence of the right ureter or secondary to recent stone passage.  Hepatomegaly with hepatic steatosis.  Original Report Authenticated By: Gwynn Burly, M.D.   Dg Abd Acute W/chest  10/23/2011   *RADIOLOGY REPORT*  Clinical Data: Nausea, vomiting, abdominal pain  ACUTE ABDOMEN SERIES (ABDOMEN 2 VIEW & CHEST 1 VIEW)  Comparison: 10/13/2011  Findings: Cardiomediastinal silhouette is unremarkable.  No acute infiltrate or pleural effusion.  No pulmonary edema.  There is nonspecific nonobstructive bowel gas pattern.  No free abdominal air.  Right pelvic phlebolith is noted.  IMPRESSION: No acute disease.  Nonspecific nonobstructive bowel gas pattern. No free abdominal air.  Original Report Authenticated By: Natasha Mead, M.D.     No diagnosis found.    MDM  See midlevel's note for H&P.   Discussed results with Dr Patsi Sears. Will see pt in consult. Asked to have [facility] admit. [facility] will place on tele.         Loren Racer, MD 10/26/11 [phone]

## 2011-10-23 NOTE — Progress Notes (Addendum)
ANTIBIOTIC CONSULT NOTE - INITIAL  Pharmacy Consult for Zosyn/Vancomycin Indication: UTI s/p recent kidney stone and placement of ureteral stent.  Allergies  Allergen Reactions  . Macrobid Other (See Comments)    Body aches, nausea and vomiting    Patient Measurements: Height: 5\' 8"  (172.7 cm) Weight: 225 lb (102.059 kg) IBW/kg (Calculated) : 63.9    Vital Signs: Temp: 100.5 F (38.1 C) (03/17 1700) Temp src: Oral (03/17 1700) BP: 118/59 mmHg (03/17 2315) Pulse Rate: 83  (03/17 2300) Intake/Output from previous day:   Intake/Output from this shift:    Labs:  Basename 10/23/11 1530  WBC 21.2*  HGB 15.6*  PLT 278  LABCREA --  CREATININE 0.94   Estimated Creatinine Clearance: 91.5 ml/min (by C-G formula based on Cr of 0.94). No results found for this basename: VANCOTROUGH:2,VANCOPEAK:2,VANCORANDOM:2,GENTTROUGH:2,GENTPEAK:2,GENTRANDOM:2,TOBRATROUGH:2,TOBRAPEAK:2,TOBRARND:2,AMIKACINPEAK:2,AMIKACINTROU:2,AMIKACIN:2, in the last 72 hours   Microbiology: Recent Results (from the past 720 hour(s))  URINE CULTURE     Status: Normal   Collection Time   10/13/11  6:50 AM      Component Value Range Status Comment   Specimen Description URINE, CLEAN CATCH   Final    Special Requests NONE   Final    Culture  Setup Time [id number]   Final    Colony Count >=100,000 COLONIES/ML   Final    Culture ESCHERICHIA COLI   Final    Report Status 10/15/2011 FINAL   Final    Organism ID, Bacteria ESCHERICHIA COLI   Final   SURGICAL PCR SCREEN     Status: Normal   Collection Time   10/14/11  8:53 AM      Component Value Range Status Comment   MRSA, PCR NEGATIVE  NEGATIVE  Final    Staphylococcus aureus NEGATIVE  NEGATIVE  Final   URINE CULTURE     Status: Normal   Collection Time   10/14/11 11:24 AM      Component Value Range Status Comment   Specimen Description KIDNEY RIGHT   Final    Special Requests NONE KIDNEY RIGHT   Final    Culture  Setup Time [id number]   Final    Colony  Count >=100,000 COLONIES/ML   Final    Culture ESCHERICHIA COLI   Final    Report Status 10/16/2011 FINAL   Final    Organism ID, Bacteria ESCHERICHIA COLI   Final     Medical History: Past Medical History  Diagnosis Date  . Depression   . kidney stone     Medications:  Scheduled:    . sodium chloride   Intravenous Once  . fentaNYL      . fentaNYL  50 mcg Intravenous Once  . ondansetron (ZOFRAN) IV  4 mg Intravenous Once  . ondansetron (ZOFRAN) IV  4 mg Intravenous Q1 Hr x 2  . piperacillin-tazobactam (ZOSYN)  IV  3.375 g Intravenous Once  . sodium chloride  1,000 mL Intravenous Once  . sodium chloride  1,000 mL Intravenous Once  . sodium chloride  1,000 mL Intravenous Once  . sodium chloride  3 mL Intravenous STAT  . vancomycin  1,000 mg Intravenous Once   Infusions:   Assessment: 46 yo female admitted with febrile illness- probably urinary source.  Will treat as urosepsis.  Goal of Therapy:  Vancomycint trough = 15-20 mcg/L  Plan:   Zosyn 3.375 Gm IV q8h EI infusion.    Vancomycin 1Gm IV q8h.  CrCl~87 (N)  F/U SCr/Levels as needed.  Adjusted Lovenox for BMI =34.7 -  Lovenox 50mg  daily.  Jennifer [name] 10/23/2011,11:49 PM

## 2011-10-23 NOTE — ED Provider Notes (Signed)
History     CSN: [id number]  Arrival date & time 10/23/11  1457   First MD Initiated Contact with Patient 10/23/11 1535      Chief Complaint  Patient presents with  . Fever    (Consider location/radiation/quality/duration/timing/severity/associated sxs/prior treatment) Patient is a 46 y.o. female presenting with fever.  Fever Primary symptoms of the febrile illness include fever.  Pt with recent kidney stone and placement of ureteral stent that was removed by Dr Brunilda Payor on Friday presents with c/c of fever that began Friday assoc with N/V and lower abd pain that began Saturday. Tmax 103.3 at home today. Vomiting non-bloody, non-bilious. She does report soreness in her back but denies specific assoc flank pain. There is no assoc CP, SOB, dysuria, hematuria, or vaginal discharge. Nothing makes her symptoms bettor or worse. At home medications have not provided symptom relief. Abd surgeries include umbilical hernia repair, hysterectomy, and left oophorectomy secondary to ovarian torsion.  Past Medical History  Diagnosis Date  . Depression   . kidney stone     Past Surgical History  Procedure Date  . Tubal ligation   . Oophorectomy     left ovary  . Vaginal hysterectomy   . Umbilical hernia repair   . Wisdom tooth extraction     Family History  Problem Relation Age of Onset  . Anesthesia problems Neg Hx   . Hypotension Neg Hx   . Malignant hyperthermia Neg Hx   . Pseudochol deficiency Neg Hx     History  Substance Use Topics  . Smoking status: Current Everyday Smoker -- 0.5 packs/day  . Smokeless tobacco: Never Used  . Alcohol Use: No    OB History    Grav Para Term Preterm Abortions TAB SAB Ect Mult Living   2 2        2       Review of Systems  Constitutional: Positive for fever.  10 systems reviewed and are otherwise negative for acute change except as noted in the HPI.   Allergies  Macrobid  Home Medications   Current Outpatient Rx  Name Route Sig  Dispense Refill  . HYDROMORPHONE HCL 4 MG PO TABS Oral Take 4 mg by mouth every 4 (four) hours as needed. For pain.    . IBUPROFEN 200 MG PO TABS Oral Take 200-400 mg by mouth every 6 (six) hours as needed. For pain    . OXYCODONE-ACETAMINOPHEN 5-325 MG PO TABS Oral Take 1-2 tablets by mouth every 4 (four) hours as needed. For pain.    Marland Kitchen PAROXETINE HCL 20 MG PO TABS Oral Take 20 mg by mouth every morning.    Marland Kitchen PROMETHAZINE HCL 25 MG PO TABS Oral Take 25 mg by mouth every 6 (six) hours as needed. For nausea      BP 98/46  Pulse 71  Temp(Src) 100.5 F (38.1 C) (Oral)  Resp 18  Ht 5\' 8"  (1.727 m)  Wt 225 lb (102.059 kg)  BMI 34.21 kg/m2  SpO2 96%  Physical Exam  Constitutional: She is oriented to person, place, and time.       Ill-appearing obese female, visibly uncomfortable. Vs reviewed, sig for fever, tachcyardia, and hypotension  HENT:  Head: Normocephalic and atraumatic.       Mucous membranes dry  Eyes: Conjunctivae are normal. Pupils are equal, round, and reactive to light.  Neck: Normal range of motion. Neck supple.  Cardiovascular: Normal rate, regular rhythm and normal heart sounds.  Although initially with tachcyardia, HR 98 on telemetry monitor on my examination  Pulmonary/Chest: Effort normal and breath sounds normal. No respiratory distress. She has no wheezes. She exhibits no tenderness.  Abdominal: Soft. Bowel sounds are normal. She exhibits no distension. There is tenderness in the right lower quadrant, suprapubic area and left lower quadrant. There is no rigidity, no rebound and no guarding. No hernia.  Genitourinary: There is no rash, tenderness or lesion on the right labia. There is no rash, tenderness or lesion on the left labia. Right adnexum displays no mass and no tenderness. Left adnexum displays no mass and no tenderness. No bleeding around the vagina. No foreign body around the vagina. No vaginal discharge found.       Uterus, cervix surgically absent    Musculoskeletal: She exhibits no edema and no tenderness.  Neurological: She is alert and oriented to person, place, and time. No cranial nerve deficit.  Skin: Skin is warm and dry. There is pallor.    ED Course  Procedures (including critical care time)  Labs Reviewed  URINALYSIS, ROUTINE W REFLEX MICROSCOPIC - Abnormal; Notable for the following:    Color, Urine AMBER (*) BIOCHEMICALS MAY BE AFFECTED BY COLOR   APPearance CLOUDY (*)    Ketones, ur TRACE (*)    Protein, ur 30 (*)    Leukocytes, UA SMALL (*)    All other components within normal limits  CBC - Abnormal; Notable for the following:    WBC 21.2 (*)    RBC 5.33 (*)    Hemoglobin 15.6 (*)    All other components within normal limits  BASIC METABOLIC PANEL - Abnormal; Notable for the following:    Sodium 130 (*)    Chloride 92 (*)    Glucose, Bld 173 (*)    GFR calc non Af Amer 71 (*)    GFR calc Af Amer 82 (*)    All other components within normal limits  URINE MICROSCOPIC-ADD ON  LACTIC ACID, PLASMA   Dg Abd Acute W/chest  10/23/2011  *RADIOLOGY REPORT*  Clinical Data: Nausea, vomiting, abdominal pain  ACUTE ABDOMEN SERIES (ABDOMEN 2 VIEW & CHEST 1 VIEW)  Comparison: 10/13/2011  Findings: Cardiomediastinal silhouette is unremarkable.  No acute infiltrate or pleural effusion.  No pulmonary edema.  There is nonspecific nonobstructive bowel gas pattern.  No free abdominal air.  Right pelvic phlebolith is noted.  IMPRESSION: No acute disease.  Nonspecific nonobstructive bowel gas pattern. No free abdominal air.  Original Report Authenticated By: Natasha Mead, M.D.      MDM  Initial tachcyardia and hypotension improved after 500cc NS bolus. Febrile illness with N/V, lower abd pain. Labs sig for leukocytosis, hyponatremia, hypochloremia, evidence of dehydration. Urine without clear evidence of infection to explain symptoms. Pelvic examination not c/w right ovarian torsion, fever and leukocytosis higher than to be expected  with torsion. CT  Abd/pelvis pending. EDP [place] will continue to follow. At 8:34 PM, pt declines further pain or nausea medication and expresses no additional needs while awaiting completion of workup.        Shaaron Adler, [place] 10/23/11 2035

## 2011-10-23 NOTE — ED Notes (Signed)
MD at bedside. 

## 2011-10-23 NOTE — ED Notes (Signed)
Family at bedside. 

## 2011-10-24 ENCOUNTER — Encounter (HOSPITAL_COMMUNITY): Payer: Self-pay | Admitting: *Deleted

## 2011-10-24 DIAGNOSIS — R509 Fever, unspecified: Secondary | ICD-10-CM | POA: Diagnosis present

## 2011-10-24 DIAGNOSIS — D72829 Elevated white blood cell count, unspecified: Secondary | ICD-10-CM | POA: Diagnosis present

## 2011-10-24 DIAGNOSIS — E871 Hypo-osmolality and hyponatremia: Secondary | ICD-10-CM | POA: Diagnosis present

## 2011-10-24 DIAGNOSIS — E860 Dehydration: Secondary | ICD-10-CM | POA: Diagnosis present

## 2011-10-24 LAB — CLOSTRIDIUM DIFFICILE BY PCR: Toxigenic C. Difficile by PCR: NEGATIVE

## 2011-10-24 LAB — CBC
MCH: 29 pg (ref 26.0–34.0)
MCHC: 33.9 g/dL (ref 30.0–36.0)
MCV: 85.5 fL (ref 78.0–100.0)
Platelets: 275 10*3/uL (ref 150–400)
RBC: 4.69 MIL/uL (ref 3.87–5.11)

## 2011-10-24 LAB — COMPREHENSIVE METABOLIC PANEL
ALT: 30 U/L (ref 0–35)
AST: 26 U/L (ref 0–37)
Albumin: 2.5 g/dL — ABNORMAL LOW (ref 3.5–5.2)
CO2: 24 mEq/L (ref 19–32)
Chloride: 106 mEq/L (ref 96–112)
Creatinine, Ser: 0.84 mg/dL (ref 0.50–1.10)
GFR calc non Af Amer: 81 mL/min — ABNORMAL LOW (ref >90)
Potassium: 2.9 mEq/L — ABNORMAL LOW (ref 3.5–5.1)
Sodium: 138 mEq/L (ref 135–145)
Total Bilirubin: 0.3 mg/dL (ref 0.3–1.2)

## 2011-10-24 LAB — TSH: TSH: 0.527 u[IU]/mL (ref 0.350–4.500)

## 2011-10-24 MED ORDER — ENOXAPARIN SODIUM 40 MG/0.4ML ~~LOC~~ SOLN: 40.0000 mg | SUBCUTANEOUS | Status: DC

## 2011-10-24 MED ORDER — LORAZEPAM 1 MG PO TABS
1.0000 mg | ORAL_TABLET | Freq: Four times a day (QID) | ORAL | Status: DC | PRN
  Administered 2011-10-24 – 2011-10-25 (×3): 1 mg via ORAL
  Filled 2011-10-24 (×3): qty 1

## 2011-10-24 MED ORDER — ONDANSETRON HCL 4 MG PO TABS: 4.0000 mg | ORAL_TABLET | Freq: Four times a day (QID) | ORAL | Status: DC | PRN

## 2011-10-24 MED ORDER — DEXTROSE 5 % IV SOLN
1.0000 g | INTRAVENOUS | Status: DC
  Administered 2011-10-24: 1 g via INTRAVENOUS
  Filled 2011-10-24 (×2): qty 10

## 2011-10-24 MED ORDER — HYDROMORPHONE HCL PF 1 MG/ML IJ SOLN: 1.0000 mg | INTRAMUSCULAR | Status: DC | PRN

## 2011-10-24 MED ORDER — SODIUM CHLORIDE 0.9 % IV SOLN
INTRAVENOUS | Status: DC
  Administered 2011-10-24: 05:00:00 via INTRAVENOUS

## 2011-10-24 MED ORDER — ONDANSETRON HCL 4 MG/2ML IJ SOLN: 4.0000 mg | Freq: Four times a day (QID) | INTRAMUSCULAR | Status: DC | PRN

## 2011-10-24 MED ORDER — ACETAMINOPHEN 650 MG RE SUPP: 650.0000 mg | Freq: Four times a day (QID) | RECTAL | Status: DC | PRN

## 2011-10-24 MED ORDER — ENOXAPARIN SODIUM 60 MG/0.6ML ~~LOC~~ SOLN
50.0000 mg | SUBCUTANEOUS | Status: DC
  Administered 2011-10-24 – 2011-10-26 (×3): 50 mg via SUBCUTANEOUS
  Filled 2011-10-24 (×3): qty 0.6

## 2011-10-24 MED ORDER — PAROXETINE HCL 20 MG PO TABS
20.0000 mg | ORAL_TABLET | Freq: Every day | ORAL | Status: DC
  Administered 2011-10-24 – 2011-10-26 (×3): 20 mg via ORAL
  Filled 2011-10-24 (×3): qty 1

## 2011-10-24 MED ORDER — HYDROCODONE-ACETAMINOPHEN 5-325 MG PO TABS
1.0000 | ORAL_TABLET | ORAL | Status: DC | PRN
  Filled 2011-10-24: qty 1

## 2011-10-24 MED ORDER — POTASSIUM CHLORIDE CRYS ER 20 MEQ PO TBCR
40.0000 meq | EXTENDED_RELEASE_TABLET | ORAL | Status: AC
  Administered 2011-10-24 (×2): 40 meq via ORAL
  Filled 2011-10-24 (×3): qty 2

## 2011-10-24 NOTE — Progress Notes (Signed)
Subjective: Patient relates her nausea and vomiting is improved she is hungry. Objective: Filed Vitals:   10/23/11 2300 10/23/11 2315 10/24/11 0017 10/24/11 0621  BP: 117/52 118/59 108/63 95/59  Pulse: 83  82 68  Temp:   98.5 F (36.9 C) 98.3 F (36.8 C)  TempSrc:   Oral Oral  Resp: 22 29 22 20   Height:   5\' 8"  (1.727 m)   Weight:   104.2 kg (229 lb 11.5 oz)   SpO2: 96%  97% 96%   Weight change:   Intake/Output Summary (Last 24 hours) at 10/24/11 1016 Last data filed at 10/24/11 0603  Gross per 24 hour  Intake   1770 ml  Output    657 ml  Net   1113 ml    General: Alert, awake, oriented x3, in no acute distress.  HEENT: No bruits, no goiter.  Heart: Regular rate and rhythm, without murmurs, rubs, gallops.  Lungs: Good air movement and clear to auscultation Abdomen: Soft, nontender, nondistended, positive bowel sounds. Negative CVA tenderness. Neuro: Grossly intact, nonfocal.   Lab Results:  [facility] 10/24/11 0457 10/23/11 1530  NA 138 130*  K 2.9* 3.5  CL 106 92*  CO2 24 25  GLUCOSE 111* 173*  BUN 7 11  CREATININE 0.84 0.94  CALCIUM 7.7* 8.7  MG -- --  PHOS -- --    Basename 10/24/11 0457  AST 26  ALT 30  ALKPHOS 66  BILITOT 0.3  PROT 5.8*  ALBUMIN 2.5*   No results found for this basename: LIPASE:2,AMYLASE:2 in the last 72 hours  Basename 10/24/11 0457 10/23/11 1530  WBC 12.3* 21.2*  NEUTROABS -- --  HGB 13.6 15.6*  HCT 40.1 44.7  MCV 85.5 83.9  PLT 275 278   No results found for this basename: CKTOTAL:3,CKMB:3,CKMBINDEX:3,TROPONINI:3 in the last 72 hours No components found with this basename: POCBNP:3 No results found for this basename: DDIMER:2 in the last 72 hours No results found for this basename: HGBA1C:2 in the last 72 hours No results found for this basename: CHOL:2,HDL:2,LDLCALC:2,TRIG:2,CHOLHDL:2,LDLDIRECT:2 in the last 72 hours No results found for this basename: TSH,T4TOTAL,FREET3,T3FREE,THYROIDAB in the last 72 hours No results  found for this basename: VITAMINB12:2,FOLATE:2,FERRITIN:2,TIBC:2,IRON:2,RETICCTPCT:2 in the last 72 hours  Micro Results: Recent Results (from the past 240 hour(s))  URINE CULTURE     Status: Normal   Collection Time   10/14/11 11:24 AM      Component Value Range Status Comment   Specimen Description KIDNEY RIGHT   Final    Special Requests NONE KIDNEY RIGHT   Final    Culture  Setup Time [id number]   Final    Colony Count >=100,000 COLONIES/ML   Final    Culture ESCHERICHIA COLI   Final    Report Status 10/16/2011 FINAL   Final    Organism ID, Bacteria ESCHERICHIA COLI   Final     Studies/Results: Ct Abdomen Pelvis W Contrast  10/23/2011  *RADIOLOGY REPORT*  Clinical Data: Abdominal pain with nausea, vomiting, and fever. Recent right ureteral stone.  CT ABDOMEN AND PELVIS WITH CONTRAST  Technique:  Multidetector CT imaging of the abdomen and pelvis was performed following the standard protocol during bolus administration of intravenous contrast.  Contrast: OMNIPAQUE IOHEXOL 300 MG/ML IJ SOLN  Comparison: 10/13/2011  Findings: New slight bibasilar atelectasis.  The patient has mild hepatomegaly with hepatic steatosis.  The spleen, pancreas, adrenal glands, and kidneys are normal.  There is slight dilatation of the right ureter but the previously noted  right ureteral stone has passed.  There is no obstruction.  I suspect the patient has slight residual edema of the mucosa of the distal ureter.  The bowel is normal including the terminal ileum and appendix. Uterus and left ovary appear to have been removed.  Right ovary is normal.  No osseous abnormality.  IMPRESSION: No acute abnormalities.  Slight residual prominence of the right ureter or secondary to recent stone passage.  Hepatomegaly with hepatic steatosis.  Original Report Authenticated By: Gwynn Burly, M.D.   Dg Abd Acute W/chest  10/23/2011  *RADIOLOGY REPORT*  Clinical Data: Nausea, vomiting, abdominal pain  ACUTE ABDOMEN  SERIES (ABDOMEN 2 VIEW & CHEST 1 VIEW)  Comparison: 10/13/2011  Findings: Cardiomediastinal silhouette is unremarkable.  No acute infiltrate or pleural effusion.  No pulmonary edema.  There is nonspecific nonobstructive bowel gas pattern.  No free abdominal air.  Right pelvic phlebolith is noted.  IMPRESSION: No acute disease.  Nonspecific nonobstructive bowel gas pattern. No free abdominal air.  Original Report Authenticated By: Natasha Mead, M.D.    Medications: I have reviewed the patient's current medications.  Assessment and plan: Active Problems:  Fever/UTI: She has defervesced with Zosyn, her white blood cells going down her nausea and vomiting have resolved she is hungry and will to try some fluid. We'll give her a diet we'll await urine cultures. I have spoken with Dr. Harriett Sine that he relates this is not a complication of the removal of the stent. A C. difficile was checked which is pending at this time she seems to be improving, there is a very low we'll of C. difficile causing this infectious process. She relates no cough no shortness of breath.   Leukocytosis Probably secondary to UTI improving.   Dehydration Resolved with IV fluids.   Hyponatremia Resolve with IV fluids.    LOS: 1 day   Marinda Elk M.D. Pager: [phone] [facility] 10/24/2011, 10:16 AM

## 2011-10-24 NOTE — Progress Notes (Signed)
ADMITTED W/N,V.RECENT STONE EXTRACTION, & REMOVAL DOUBLE J STENT.FROM HOME ALONE.UR COMPLETED.

## 2011-10-24 NOTE — ED Provider Notes (Signed)
Medical screening examination/treatment/procedure(s) were conducted as a shared visit with non-physician practitioner(s) and myself.  I personally evaluated the patient during the encounter   Loren Racer, MD 10/24/11 [phone]

## 2011-10-24 NOTE — Progress Notes (Signed)
CRITICAL VALUE ALERT  Critical value received:  Gram positive cocci clusters from blood culture drawn 3/17  Date of notification: 3/18  Time of notification: 2144  Critical value read back:yes  Nurse who received alert:  Casilda Carls RN  MD notified (1st page):  Magick Izola Price  Time of first page:  2145  MD notified (2nd page):  Time of second page:  Responding MD:  Verta Ellen  Time MD responded:  2205

## 2011-10-24 NOTE — Progress Notes (Signed)
T: 98.1        BP:  106/68                   P:  75              R:  20 Admitted yesterday with fever, nausea, vomiting, diarrhea. Had stone extraction on 3/8 and stent removal on 3/15.  Patient was on Macrobid for E.coli UTI. Urine culture at the office on 3/15 showed insignificant growth. Urinalysis now reveals 7-10 WBC's, rare bacteria. Urine culture is pending.   CT scan showed no residual stone, mild dilation of the right ureter probably secondary to edema. She feels better today.  Does not have nausea or vomiting.  Still has diarrhea. Does not have any flank pain.   Suggest : continue IV antibiotics.  Advance diet as tolerated.

## 2011-10-24 NOTE — H&P (Signed)
PCP:   Roxy Manns, MD, MD   Chief Complaint:  Nausea, vomiting and fever since Friday.  HPI: 46 year old lady with h/o renal stones, s/p stone extraction, insertion of double J stent and removal of the stent last Friday, comes in for persistent nausea, vomiting and fever since the removal of the stent on Friday. Her T max is 103 at home. She also reports lower abd pain and some back discomfort. In ED, she was found to be hypotensive , tachycardic, with leukocytosis of 21,000.  she underwent a CT ABD showing a prominent  right ureter. She also reports loose bowel movements for 1 week. She is being admitted for evaluation and treatment of recurrent UTI , in view of her recent stent removal.   Review of Systems:  The patient denies , weight loss,, vision loss, decreased hearing, hoarseness, chest pain, syncope, dyspnea on exertion, peripheral edema, balance deficits, hemoptysis,melena, hematochezia, severe indigestion/heartburn, hematuria, incontinence, genital sores, muscle weakness, suspicious skin lesions, transient blindness, difficulty walking, depression, unusual weight change, abnormal bleeding, enlarged lymph nodes, angioedema, and breast masses.  Past Medical History: Past Medical History  Diagnosis Date  . Depression   . kidney stone    Past Surgical History  Procedure Date  . Tubal ligation   . Oophorectomy     left ovary  . Vaginal hysterectomy   . Umbilical hernia repair   . Wisdom tooth extraction     Medications: Prior to Admission medications   Medication Sig Start Date End Date Taking? Authorizing Provider  HYDROmorphone (DILAUDID) 4 MG tablet Take 4 mg by mouth every 4 (four) hours as needed. For pain.   Yes Historical Provider, MD  ibuprofen (ADVIL,MOTRIN) 200 MG tablet Take 200-400 mg by mouth every 6 (six) hours as needed. For pain   Yes Historical Provider, MD  oxyCODONE-acetaminophen (PERCOCET) 5-325 MG per tablet Take 1-2 tablets by mouth every 4 (four) hours as  needed. For pain.   Yes Historical Provider, MD  PARoxetine (PAXIL) 20 MG tablet Take 20 mg by mouth every morning.   Yes Historical Provider, MD  promethazine (PHENERGAN) 25 MG tablet Take 25 mg by mouth every 6 (six) hours as needed. For nausea   Yes Historical Provider, MD    Allergies:   Allergies  Allergen Reactions  . Macrobid Other (See Comments)    Body aches, nausea and vomiting    Social History:  reports that she has been smoking.  She has never used smokeless tobacco. She reports that she does not drink alcohol or use illicit drugs.   Family History: Family History  Problem Relation Age of Onset  . Anesthesia problems Neg Hx   . Hypotension Neg Hx   . Malignant hyperthermia Neg Hx   . Pseudochol deficiency Neg Hx     Physical Exam: Filed Vitals:   10/23/11 2245 10/23/11 2300 10/23/11 2315 10/24/11 0017  BP: 115/46 117/52 118/59 108/63  Pulse: 82 83  82  Temp:    98.5 F (36.9 C)  TempSrc:    Oral  Resp: 22 22 29 22   Height:    5\' 8"  (1.727 m)  Weight:    104.2 kg (229 lb 11.5 oz)  SpO2: 95% 96%  97%   Constitutional: Vital signs reviewed.  Patient is a well-developed and well-nourished  in no acute distress and cooperative with exam. Alert and oriented x3.  Head: Normocephalic and atraumatic Mouth: no erythema or exudates,DMM Eyes: PERRL, EOMI, conjunctivae normal, No scleral icterus.  Neck: Supple, Trachea midline normal ROM, No JVD, mass, thyromegaly, or carotid bruit present.  Cardiovascular: RRR, S1 normal, S2 normal, no MRG, pulses symmetric and intact bilaterally Pulmonary/Chest: CTAB, no wheezes, rales, or rhonchi Abdominal: Soft. Tender in the lower quadrant, non-distended, bowel sounds are normal, no masses, organomegaly, or guarding present.  GU: no CVA tenderness Musculoskeletal: No joint deformities, erythema, or stiffness, ROM full and no nontender Neurological: A&O x3, Strenght is normal and symmetric bilaterally, cranial nerve II-XII are  grossly intact, no focal motor deficit, sensory intact to light touch bilaterally.  Skin: Warm, dry and intact. No rash, cyanosis, or clubbing.  Psychiatric: Normal mood and affect. speech and behavior is normal.      Labs on Admission:   [facility] 10/23/11 1530  NA 130*  K 3.5  CL 92*  CO2 25  GLUCOSE 173*  BUN 11  CREATININE 0.94  CALCIUM 8.7  MG --  PHOS --   No results found for this basename: AST:2,ALT:2,ALKPHOS:2,BILITOT:2,PROT:2,ALBUMIN:2 in the last 72 hours No results found for this basename: LIPASE:2,AMYLASE:2 in the last 72 hours  Basename 10/23/11 1530  WBC 21.2*  NEUTROABS --  HGB 15.6*  HCT 44.7  MCV 83.9  PLT 278   No results found for this basename: CKTOTAL:3,CKMB:3,CKMBINDEX:3,TROPONINI:3 in the last 72 hours No results found for this basename: TSH,T4TOTAL,FREET3,T3FREE,THYROIDAB in the last 72 hours No results found for this basename: VITAMINB12:2,FOLATE:2,FERRITIN:2,TIBC:2,IRON:2,RETICCTPCT:2 in the last 72 hours  Radiological Exams on Admission: Ct Abdomen Pelvis W Contrast  10/23/2011  *RADIOLOGY REPORT*  Clinical Data: Abdominal pain with nausea, vomiting, and fever. Recent right ureteral stone.  CT ABDOMEN AND PELVIS WITH CONTRAST  Technique:  Multidetector CT imaging of the abdomen and pelvis was performed following the standard protocol during bolus administration of intravenous contrast.  Contrast: OMNIPAQUE IOHEXOL 300 MG/ML IJ SOLN  Comparison: 10/13/2011  Findings: New slight bibasilar atelectasis.  The patient has mild hepatomegaly with hepatic steatosis.  The spleen, pancreas, adrenal glands, and kidneys are normal.  There is slight dilatation of the right ureter but the previously noted right ureteral stone has passed.  There is no obstruction.  I suspect the patient has slight residual edema of the mucosa of the distal ureter.  The bowel is normal including the terminal ileum and appendix. Uterus and left ovary appear to have been removed.   Right ovary is normal.  No osseous abnormality.  IMPRESSION: No acute abnormalities.  Slight residual prominence of the right ureter or secondary to recent stone passage.  Hepatomegaly with hepatic steatosis.  Original Report Authenticated By: Gwynn Burly, M.D.   Ct Abdomen Pelvis W Contrast  10/13/2011  *RADIOLOGY REPORT*  Clinical Data: Right lower quadrant pain and flank pain; recent urinary tract infection  CT ABDOMEN AND PELVIS WITH CONTRAST  Technique:  Multidetector CT imaging of the abdomen and pelvis was performed following the standard protocol during bolus administration of intravenous contrast.  Contrast: 30mL OMNIPAQUE IOHEXOL 300 MG/ML IJ SOLN, OMNIPAQUE IOHEXOL 300 MG/ML IJ SOLN  Comparison: August 20, 2010  Findings: There is  right renal enlargement, along with hydronephrosis and hydroureter.  A 6 mm obstructing calculus is present within the distal right ureter.  There is mild dependent atelectasis within the lung bases.  There is diffuse fatty infiltration of the liver.  The gallbladder, spleen, pancreas, adrenal glands, left kidney, urinary bladder, osseous structures have a normal appearance.  The bowel has a normal appearance with no evidence of gross inflammation or obstruction.  The appendix is seen in the right lower quadrant, and has a normal appearance.  The right ovary is unremarkable in appearance.  There is no free fluid or adenopathy within the abdomen or pelvis.  No pneumoperitoneum.  IMPRESSION: There is a 6 mm obstructing distal right ureteral calculus.  Original Report Authenticated By: Brandon Melnick, M.D.   Dg Abd Acute W/chest  10/23/2011  *RADIOLOGY REPORT*  Clinical Data: Nausea, vomiting, abdominal pain  ACUTE ABDOMEN SERIES (ABDOMEN 2 VIEW & CHEST 1 VIEW)  Comparison: 10/13/2011  Findings: Cardiomediastinal silhouette is unremarkable.  No acute infiltrate or pleural effusion.  No pulmonary edema.  There is nonspecific nonobstructive bowel gas pattern.  No  free abdominal air.  Right pelvic phlebolith is noted.  IMPRESSION: No acute disease.  Nonspecific nonobstructive bowel gas pattern. No free abdominal air.  Original Report Authenticated By: Natasha Mead, M.D.   Dg C-arm 1-60 Min-no Report  10/14/2011  CLINICAL DATA: surgery   C-ARM 1-60 MINUTES  Fluoroscopy was utilized by the requesting physician.  No radiographic  interpretation.      Assessment/Plan Present on Admission:  Fever/Leukocytosis; in view of her recent UTI/ STENT placement and removal, will treat her for recurrent UTI with iv vanco and iv zosyn, even though her UA shows small leukocytes. Blood cultures and Urine cultures are sent. CXR does not show pneumonia. Urology consult called by ED physician and Dr Marcello Fennel to see the patient in the morning.  Dehydration; Hydration with IV normal saline.  Hyponatremia: probably secondary to dehydration. Repeat labs in am and evaluate further if persistent.  Nausea and Vomiting Most likely secondary to residual UTI. Symptomatic treatment with IV anti emetics. Diarrhea: c diff PCR to evaluate for c diff diarrhea with recent antibiotic use DVT prophylaxis: lovenox FULL code.     Time spent on this patient including examination and decision-making process: 58 minutes.  Kamau Weatherall [phone] 10/24/2011, 1:56 AM

## 2011-10-25 LAB — URINE CULTURE

## 2011-10-25 MED ORDER — DIPHENHYDRAMINE HCL 50 MG PO CAPS
50.0000 mg | ORAL_CAPSULE | Freq: Three times a day (TID) | ORAL | Status: AC
  Administered 2011-10-25 (×3): 50 mg via ORAL
  Filled 2011-10-25 (×3): qty 1

## 2011-10-25 MED ORDER — CIPROFLOXACIN HCL 500 MG PO TABS
500.0000 mg | ORAL_TABLET | Freq: Two times a day (BID) | ORAL | Status: DC
  Filled 2011-10-25 (×2): qty 1

## 2011-10-25 MED ORDER — CIPROFLOXACIN HCL 500 MG PO TABS
500.0000 mg | ORAL_TABLET | Freq: Two times a day (BID) | ORAL | Status: DC
  Administered 2011-10-25 – 2011-10-26 (×3): 500 mg via ORAL
  Filled 2011-10-25 (×4): qty 1

## 2011-10-25 NOTE — Progress Notes (Addendum)
Subjective: Feels tired.  Objective: Filed Vitals:   10/24/11 0621 10/24/11 1418 10/24/11 2129 10/25/11 0557  BP: 95/59 106/68 104/65 106/62  Pulse: 68 75 76 71  Temp: 98.3 F (36.8 C) 98.1 F (36.7 C) 99.2 F (37.3 C) 98.2 F (36.8 C)  TempSrc: Oral Oral Oral Oral  Resp: 20 20 20 19   Height:      Weight:      SpO2: 96% 96% 96% 97%   Weight change:   Intake/Output Summary (Last 24 hours) at 10/25/11 0904 Last data filed at 10/25/11 0700  Gross per 24 hour  Intake 2293.33 ml  Output   1906 ml  Net 387.33 ml    General: Alert, awake, oriented x3, in no acute distress.  HEENT: No bruits, no goiter.  Heart: Regular rate and rhythm, without murmurs, rubs, gallops.  Lungs: Good air movement and clear to auscultation Abdomen: Soft, nontender, nondistended, positive bowel sounds. Negative CVA tenderness. Neuro: Grossly intact, nonfocal.   Lab Results:  [facility] 10/24/11 0457 10/23/11 1530  NA 138 130*  K 2.9* 3.5  CL 106 92*  CO2 24 25  GLUCOSE 111* 173*  BUN 7 11  CREATININE 0.84 0.94  CALCIUM 7.7* 8.7  MG -- --  PHOS -- --    Basename 10/24/11 0457  AST 26  ALT 30  ALKPHOS 66  BILITOT 0.3  PROT 5.8*  ALBUMIN 2.5*   No results found for this basename: LIPASE:2,AMYLASE:2 in the last 72 hours  Basename 10/24/11 0457 10/23/11 1530  WBC 12.3* 21.2*  NEUTROABS -- --  HGB 13.6 15.6*  HCT 40.1 44.7  MCV 85.5 83.9  PLT 275 278   No results found for this basename: CKTOTAL:3,CKMB:3,CKMBINDEX:3,TROPONINI:3 in the last 72 hours No components found with this basename: POCBNP:3 No results found for this basename: DDIMER:2 in the last 72 hours No results found for this basename: HGBA1C:2 in the last 72 hours No results found for this basename: CHOL:2,HDL:2,LDLCALC:2,TRIG:2,CHOLHDL:2,LDLDIRECT:2 in the last 72 hours  Basename 10/24/11 0457  TSH 0.527  T4TOTAL --  T3FREE --  THYROIDAB --   No results found for this basename:  VITAMINB12:2,FOLATE:2,FERRITIN:2,TIBC:2,IRON:2,RETICCTPCT:2 in the last 72 hours  Micro Results: Recent Results (from the past 240 hour(s))  CULTURE, BLOOD (ROUTINE X 2)     Status: Normal (Preliminary result)   Collection Time   10/23/11  3:30 PM      Component Value Range Status Comment   Specimen Description BLOOD LEFT ANTECUBITAL   Final    Special Requests BOTTLES DRAWN AEROBIC AND ANAEROBIC 5 CC EA   Final    Culture  Setup Time [id number]   Final    Culture     Final    Value:        BLOOD CULTURE RECEIVED NO GROWTH TO DATE CULTURE WILL BE HELD FOR 5 DAYS BEFORE ISSUING A FINAL NEGATIVE REPORT   Report Status PENDING   Incomplete   CULTURE, BLOOD (ROUTINE X 2)     Status: Normal (Preliminary result)   Collection Time   10/23/11  3:35 PM      Component Value Range Status Comment   Specimen Description BLOOD RIGHT ANTECUBITAL   Final    Special Requests BOTTLES DRAWN AEROBIC AND ANAEROBIC 5 CC EA   Final    Culture  Setup Time [id number]   Final    Culture     Final    Value: GRAM POSITIVE COCCI IN CLUSTERS     Note: Gram Stain Report  Called to,Read Back By and Verified With: LORI HARRIS @ 2144 ON 10/24/11 BY GOLLD   Report Status PENDING   Incomplete   URINE CULTURE     Status: Normal   Collection Time   10/23/11  9:53 PM      Component Value Range Status Comment   Specimen Description URINE, CLEAN CATCH   Final    Special Requests NONE   Final    Culture  Setup Time [id number]   Final    Colony Count 7,000 COLONIES/ML   Final    Culture INSIGNIFICANT GROWTH   Final    Report Status 10/25/2011 FINAL   Final   CLOSTRIDIUM DIFFICILE BY PCR     Status: Normal   Collection Time   10/23/11 11:42 PM      Component Value Range Status Comment   C difficile by pcr NEGATIVE  NEGATIVE  Final     Studies/Results: Ct Abdomen Pelvis W Contrast  10/23/2011  *RADIOLOGY REPORT*  Clinical Data: Abdominal pain with nausea, vomiting, and fever. Recent right ureteral stone.  CT ABDOMEN  AND PELVIS WITH CONTRAST  Technique:  Multidetector CT imaging of the abdomen and pelvis was performed following the standard protocol during bolus administration of intravenous contrast.  Contrast: OMNIPAQUE IOHEXOL 300 MG/ML IJ SOLN  Comparison: 10/13/2011  Findings: New slight bibasilar atelectasis.  The patient has mild hepatomegaly with hepatic steatosis.  The spleen, pancreas, adrenal glands, and kidneys are normal.  There is slight dilatation of the right ureter but the previously noted right ureteral stone has passed.  There is no obstruction.  I suspect the patient has slight residual edema of the mucosa of the distal ureter.  The bowel is normal including the terminal ileum and appendix. Uterus and left ovary appear to have been removed.  Right ovary is normal.  No osseous abnormality.  IMPRESSION: No acute abnormalities.  Slight residual prominence of the right ureter or secondary to recent stone passage.  Hepatomegaly with hepatic steatosis.  Original Report Authenticated By: Gwynn Burly, M.D.   Dg Abd Acute W/chest  10/23/2011  *RADIOLOGY REPORT*  Clinical Data: Nausea, vomiting, abdominal pain  ACUTE ABDOMEN SERIES (ABDOMEN 2 VIEW & CHEST 1 VIEW)  Comparison: 10/13/2011  Findings: Cardiomediastinal silhouette is unremarkable.  No acute infiltrate or pleural effusion.  No pulmonary edema.  There is nonspecific nonobstructive bowel gas pattern.  No free abdominal air.  Right pelvic phlebolith is noted.  IMPRESSION: No acute disease.  Nonspecific nonobstructive bowel gas pattern. No free abdominal air.  Original Report Authenticated By: Natasha Mead, M.D.    Medications: I have reviewed the patient's current medications.  Assessment and plan: Active Problems:  Fever/UTI: Urine cultures urine cultures. 7, 000 colonies will go ahead and stop zosyn and vancomycin, as she is developing a rash looks urticaric ? allergy to zosyn vs red man syndrome she has no itching, or flushing but it does  predominate in the upper body. benadryl. Will use a quinolone. Previous urine culture grew E. Coli sensitive to cipro. A C. difficile was checked negative. .   Leukocytosis Probably secondary to UTI improving.   Dehydration Resolved with IV fluids.   Hyponatremia Resolve with IV fluids. D/c IV fluids.  Dispo: 24-48Hrs.   LOS: 2 days   Marinda Elk M.D. Pager: [phone] [facility] 10/25/2011, 9:04 AM

## 2011-10-25 NOTE — Progress Notes (Signed)
Afebrile.  V/S stable. No nausea or vomiting. Still c/o diarrhea. Voids well.  Urine clear. Urine culture: insignificant growth. Blood cultures: gm positive cocci. Urologically stable. Continue management as per Hospitalist.

## 2011-10-26 LAB — CULTURE, BLOOD (ROUTINE X 2)

## 2011-10-26 MED ORDER — LORAZEPAM 0.5 MG PO TABS: 0.5000 mg | ORAL_TABLET | Freq: Four times a day (QID) | ORAL | Status: AC | PRN

## 2011-10-26 MED ORDER — CIPROFLOXACIN HCL 500 MG PO TABS: 500.0000 mg | ORAL_TABLET | Freq: Two times a day (BID) | ORAL | Status: AC

## 2011-10-26 NOTE — Progress Notes (Signed)
PROGRESS NOTE  Jennifer [name] IRJ:[id number] DOB: 1965/12/05 DOA: 10/23/2011 PCP: Roxy Manns, MD, MD  Brief narrative: 46 year old woman with history of double J stent placement earlier this month presented emergency department with fever associated with nausea, vomiting and lower abdominal pain status post removal of ureteral stent.  Chart review  10/14/2011: Right distal ureteral calculus with corresponding hydronephrosis. Stone extraction and insertion of double-J stent  Past medical history: Depression, kidney stones  Consultants:  Urology  Procedures:  None  Antibiotics:  March 19: Ciprofloxacin  March 18-18: Rocephin  March 18-18: Zosyn  March 17-19: Vancomycin  Interim History: Chart reviewed in detail. Afebrile, vital signs stable.  Subjective: Feels well. No nausea, vomiting, diarrhea. Wants to go home.  Objective: Filed Vitals:   10/25/11 0557 10/25/11 1300 10/25/11 2143 10/26/11 0530  BP: 106/62 105/61 107/64 122/70  Pulse: 71 70 67 67  Temp: 98.2 F (36.8 C) 97.5 F (36.4 C) 98.7 F (37.1 C) 98 F (36.7 C)  TempSrc: Oral Oral Oral Oral  Resp: 19 20 20 18   Height:      Weight:      SpO2: 97% 97% 99% 98%    Intake/Output Summary (Last 24 hours) at 10/26/11 1305 Last data filed at 10/26/11 0618  Gross per 24 hour  Intake    240 ml  Output    400 ml  Net   -160 ml    Exam:   General:  Appears calm and comfortable.  Cardiovascular: Regular rate and rhythm. No murmur, rub, gallop.  Respiratory: Clear to auscultation bilaterally. No wheezes, rales, rhonchi. Normal respiratory effort.  Skin: Feeding reticular rash upper extremities and chest. No rash on back. Fashion was completely resolved on legs.  Psychiatric: Grossly normal mood and affect. Speech fluent and appropriate.  Data Reviewed: Basic Metabolic Panel:  Lab 10/24/11 3016 10/23/11 1530  NA 138 130*  K 2.9* 3.5  CL 106 92*  CO2 24 25  GLUCOSE 111* 173*  BUN 7 11    CREATININE 0.84 0.94  CALCIUM 7.7* 8.7  MG -- --  PHOS -- --   Liver Function Tests:  Lab 10/24/11 0457  AST 26  ALT 30  ALKPHOS 66  BILITOT 0.3  PROT 5.8*  ALBUMIN 2.5*   CBC:  Lab 10/24/11 0457 10/23/11 1530  WBC 12.3* 21.2*  NEUTROABS -- --  HGB 13.6 15.6*  HCT 40.1 44.7  MCV 85.5 83.9  PLT 275 278    Recent Results (from the past 240 hour(s))  CULTURE, BLOOD (ROUTINE X 2)     Status: Normal (Preliminary result)   Collection Time   10/23/11  3:30 PM      Component Value Range Status Comment   Specimen Description BLOOD LEFT ANTECUBITAL   Final    Special Requests BOTTLES DRAWN AEROBIC AND ANAEROBIC 5 CC EA   Final    Culture  Setup Time [id number]   Final    Culture     Final    Value:        BLOOD CULTURE RECEIVED NO GROWTH TO DATE CULTURE WILL BE HELD FOR 5 DAYS BEFORE ISSUING A FINAL NEGATIVE REPORT   Report Status PENDING   Incomplete   CULTURE, BLOOD (ROUTINE X 2)     Status: Normal   Collection Time   10/23/11  3:35 PM      Component Value Range Status Comment   Specimen Description BLOOD RIGHT ANTECUBITAL   Final    Special Requests BOTTLES DRAWN AEROBIC  AND ANAEROBIC 5 CC EA   Final    Culture  Setup Time [id number]   Final    Culture     Final    Value: STAPHYLOCOCCUS SPECIES (COAGULASE NEGATIVE)     Note: THE SIGNIFICANCE OF ISOLATING THIS ORGANISM FROM A SINGLE SET OF BLOOD CULTURES WHEN MULTIPLE SETS ARE DRAWN IS UNCERTAIN. PLEASE NOTIFY THE MICROBIOLOGY DEPARTMENT WITHIN ONE WEEK IF SPECIATION AND SENSITIVITIES ARE REQUIRED.     Note: Gram Stain Report Called to,Read Back By and Verified With: LORI HARRIS @ 2144 ON 10/24/11 BY GOLLD   Report Status 10/26/2011 FINAL   Final   URINE CULTURE     Status: Normal   Collection Time   10/23/11  9:53 PM      Component Value Range Status Comment   Specimen Description URINE, CLEAN CATCH   Final    Special Requests NONE   Final    Culture  Setup Time [id number]   Final    Colony Count 7,000 COLONIES/ML    Final    Culture INSIGNIFICANT GROWTH   Final    Report Status 10/25/2011 FINAL   Final   CLOSTRIDIUM DIFFICILE BY PCR     Status: Normal   Collection Time   10/23/11 11:42 PM      Component Value Range Status Comment   C difficile by pcr NEGATIVE  NEGATIVE  Final      Studies: Ct Abdomen Pelvis W Contrast  10/23/2011  *RADIOLOGY REPORT*  Clinical Data: Abdominal pain with nausea, vomiting, and fever. Recent right ureteral stone.  CT ABDOMEN AND PELVIS WITH CONTRAST  Technique:  Multidetector CT imaging of the abdomen and pelvis was performed following the standard protocol during bolus administration of intravenous contrast.  Contrast: OMNIPAQUE IOHEXOL 300 MG/ML IJ SOLN  Comparison: 10/13/2011  Findings: New slight bibasilar atelectasis.  The patient has mild hepatomegaly with hepatic steatosis.  The spleen, pancreas, adrenal glands, and kidneys are normal.  There is slight dilatation of the right ureter but the previously noted right ureteral stone has passed.  There is no obstruction.  I suspect the patient has slight residual edema of the mucosa of the distal ureter.  The bowel is normal including the terminal ileum and appendix. Uterus and left ovary appear to have been removed.  Right ovary is normal.  No osseous abnormality.  IMPRESSION: No acute abnormalities.  Slight residual prominence of the right ureter or secondary to recent stone passage.  Hepatomegaly with hepatic steatosis.  Original Report Authenticated By: Gwynn Burly, M.D.   Dg Abd Acute W/chest  10/23/2011  *RADIOLOGY REPORT*  Clinical Data: Nausea, vomiting, abdominal pain  ACUTE ABDOMEN SERIES (ABDOMEN 2 VIEW & CHEST 1 VIEW)  Comparison: 10/13/2011  Findings: Cardiomediastinal silhouette is unremarkable.  No acute infiltrate or pleural effusion.  No pulmonary edema.  There is nonspecific nonobstructive bowel gas pattern.  No free abdominal air.  Right pelvic phlebolith is noted.  IMPRESSION: No acute disease.   Nonspecific nonobstructive bowel gas pattern. No free abdominal air.  Original Report Authenticated By: Natasha Mead, M.D.   Scheduled Meds:   . ciprofloxacin  500 mg Oral BID  . diphenhydrAMINE  50 mg Oral Q8H  . enoxaparin (LOVENOX) injection  50 mg Subcutaneous Q24H  . PARoxetine  20 mg Oral Daily   Continuous Infusions:    Assessment/Plan: 1. UTI with Sepsis: Sepsis resolved. Continue empiric therapy with ciprofloxacin for presumed UTI given history. Note that the patient was on antibiotics when  culture was obtained. 2. Rash: Possibly related to Zosyn or vancomycin. No itching. Would favor a reaction to vancomycin but not a clear allergic reaction. 3. Dehydration: Resolved. 4. Bacteremia: Spurious--contaminant. No further evaluation. 5. Hyponatremia: Resolved. Secondary to dehydration. 6. Nausea, vomiting, diarrhea: Resolved. Negative C. difficile PCR.  Code Status: Full code. Family Communication: None at bedside. Disposition Plan: Home today.   Brendia Sacks, MD  [facility] Pager [phone] 10/26/2011, 1:05 PM    LOS: 3 days

## 2011-10-26 NOTE — Discharge Summary (Addendum)
Physician Discharge Summary  Jennifer [name] ZOX:[id number] DOB: Dec 27, 1965 DOA: 10/23/2011  PCP: Roxy Manns, MD, MD  Admit date: 10/23/2011 Discharge date: 10/26/2011  Discharge Diagnoses:  1. UTI with sepsis 2. Dehydration 3. Hyponatremia 4. Nausea, vomiting, diarrhea  Discharge Condition: Improved  Disposition: Home  History of present illness:  46 year old woman with history of double J stent placement earlier this month presented emergency department with fever associated with nausea, vomiting and lower abdominal pain status post removal of ureteral stent.  Chart review  10/14/2011: Right distal ureteral calculus with corresponding hydronephrosis. Stone extraction and insertion of double-J stent  Hospital Course:  Jennifer [name] was admitted and treated with IV antibiotics. Sepsis quickly resolved and her condition improved. Nausea, vomiting and diarrhea quickly resolved and more likely secondary to acute illness. Blood cultures and urine culture were ultimately unrevealing however the patient had been on oral antibiotics prior to admission. Given her history therefore UTI this was likely etiology and she has been treated accordingly. She did develop a rash during hospitalization which may be related to vancomycin there is no pruritus. It is not clear at this point that this was an allergic reaction. 1. UTI with Sepsis: Sepsis resolved. Continue empiric therapy with ciprofloxacin for presumed UTI given history. Note that the patient was on antibiotics when culture was obtained.  2. Rash: Possibly related to Zosyn or vancomycin. No itching. Would favor a reaction to vancomycin but not a clear allergic reaction.  3. Dehydration: Resolved.  4. Bacteremia: Spurious--contaminant. No further evaluation.  5. Hyponatremia: Resolved. Secondary to dehydration.  6. Nausea, vomiting, diarrhea: Resolved. Negative C. difficile  PCR.  Consultants:  Urology  Procedures:  None  Antibiotics:  March 19: Ciprofloxacin   March 18-18: Rocephin   March 18-18: Zosyn   March 17-19: Vancomycin  Discharge Instructions  Discharge Orders    Future Orders Please Complete By Expires   Diet general      Activity as tolerated - No restrictions        Medication List  As of 10/26/2011  1:55 PM   TAKE these medications         ciprofloxacin 500 MG tablet   Commonly known as: CIPRO   Take 1 tablet (500 mg total) by mouth 2 (two) times daily.      HYDROmorphone 4 MG tablet   Commonly known as: DILAUDID   Take 4 mg by mouth every 4 (four) hours as needed. For pain.      ibuprofen 200 MG tablet   Commonly known as: ADVIL,MOTRIN   Take 200-400 mg by mouth every 6 (six) hours as needed. For pain      LORazepam 0.5 MG tablet   Commonly known as: ATIVAN   Take 1 tablet (0.5 mg total) by mouth every 6 (six) hours as needed for anxiety.      oxyCODONE-acetaminophen 5-325 MG per tablet   Commonly known as: PERCOCET   Take 1-2 tablets by mouth every 4 (four) hours as needed. For pain.      PARoxetine 20 MG tablet   Commonly known as: PAXIL   Take 20 mg by mouth every morning.      promethazine 25 MG tablet   Commonly known as: PHENERGAN   Take 25 mg by mouth every 6 (six) hours as needed. For nausea           Follow-up Information    Follow up with Roxy Manns, MD .  The results of significant diagnostics from this hospitalization (including imaging, microbiology, ancillary and laboratory) are listed below for reference.    Significant Diagnostic Studies: Ct Abdomen Pelvis W Contrast  10/23/2011  *RADIOLOGY REPORT*  Clinical Data: Abdominal pain with nausea, vomiting, and fever. Recent right ureteral stone.  CT ABDOMEN AND PELVIS WITH CONTRAST  Technique:  Multidetector CT imaging of the abdomen and pelvis was performed following the standard protocol during bolus administration of  intravenous contrast.  Contrast: OMNIPAQUE IOHEXOL 300 MG/ML IJ SOLN  Comparison: 10/13/2011  Findings: New slight bibasilar atelectasis.  The patient has mild hepatomegaly with hepatic steatosis.  The spleen, pancreas, adrenal glands, and kidneys are normal.  There is slight dilatation of the right ureter but the previously noted right ureteral stone has passed.  There is no obstruction.  I suspect the patient has slight residual edema of the mucosa of the distal ureter.  The bowel is normal including the terminal ileum and appendix. Uterus and left ovary appear to have been removed.  Right ovary is normal.  No osseous abnormality.  IMPRESSION: No acute abnormalities.  Slight residual prominence of the right ureter or secondary to recent stone passage.  Hepatomegaly with hepatic steatosis.  Original Report Authenticated By: Gwynn Burly, M.D.   Dg Abd Acute W/chest  10/23/2011  *RADIOLOGY REPORT*  Clinical Data: Nausea, vomiting, abdominal pain  ACUTE ABDOMEN SERIES (ABDOMEN 2 VIEW & CHEST 1 VIEW)  Comparison: 10/13/2011  Findings: Cardiomediastinal silhouette is unremarkable.  No acute infiltrate or pleural effusion.  No pulmonary edema.  There is nonspecific nonobstructive bowel gas pattern.  No free abdominal air.  Right pelvic phlebolith is noted.   Original Report Authenticated By: Natasha Mead, M.D.    Microbiology: Recent Results (from the past 240 hour(s))  CULTURE, BLOOD (ROUTINE X 2)     Status: Normal (Preliminary result)   Collection Time   10/23/11  3:30 PM      Component Value Range Status Comment   Specimen Description BLOOD LEFT ANTECUBITAL   Final    Special Requests BOTTLES DRAWN AEROBIC AND ANAEROBIC 5 CC EA   Final    Culture  Setup Time [id number]   Final    Culture     Final    Value:        BLOOD CULTURE RECEIVED NO GROWTH TO DATE CULTURE WILL BE HELD FOR 5 DAYS BEFORE ISSUING A FINAL NEGATIVE REPORT   Report Status PENDING   Incomplete   CULTURE, BLOOD (ROUTINE X 2)      Status: Normal   Collection Time   10/23/11  3:35 PM      Component Value Range Status Comment   Specimen Description BLOOD RIGHT ANTECUBITAL   Final    Special Requests BOTTLES DRAWN AEROBIC AND ANAEROBIC 5 CC EA   Final    Culture  Setup Time [id number]   Final    Culture     Final    Value: STAPHYLOCOCCUS SPECIES (COAGULASE NEGATIVE)     Note: THE SIGNIFICANCE OF ISOLATING THIS ORGANISM FROM A SINGLE SET OF BLOOD CULTURES WHEN MULTIPLE SETS ARE DRAWN IS UNCERTAIN. PLEASE NOTIFY THE MICROBIOLOGY DEPARTMENT WITHIN ONE WEEK IF SPECIATION AND SENSITIVITIES ARE REQUIRED.     Note: Gram Stain Report Called to,Read Back By and Verified With: LORI HARRIS @ 2144 ON 10/24/11 BY GOLLD   Report Status 10/26/2011 FINAL   Final   URINE CULTURE     Status: Normal   Collection Time  10/23/11  9:53 PM      Component Value Range Status Comment   Specimen Description URINE, CLEAN CATCH   Final    Special Requests NONE   Final    Culture  Setup Time [id number]   Final    Colony Count 7,000 COLONIES/ML   Final    Culture INSIGNIFICANT GROWTH   Final    Report Status 10/25/2011 FINAL   Final   CLOSTRIDIUM DIFFICILE BY PCR     Status: Normal   Collection Time   10/23/11 11:42 PM      Component Value Range Status Comment   C difficile by pcr NEGATIVE  NEGATIVE  Final      Labs: Basic Metabolic Panel:  Lab 10/24/11 8119 10/23/11 1530  NA 138 130*  K 2.9* 3.5  CL 106 92*  CO2 24 25  GLUCOSE 111* 173*  BUN 7 11  CREATININE 0.84 0.94  CALCIUM 7.7* 8.7  MG -- --  PHOS -- --   Liver Function Tests:  Lab 10/24/11 0457  AST 26  ALT 30  ALKPHOS 66  BILITOT 0.3  PROT 5.8*  ALBUMIN 2.5*   CBC:  Lab 10/24/11 0457 10/23/11 1530  WBC 12.3* 21.2*  NEUTROABS -- --  HGB 13.6 15.6*  HCT 40.1 44.7  MCV 85.5 83.9  PLT 275 278    Time coordinating discharge: 25 minutes.  Signed:  Brendia Sacks, MD  [facility] 10/26/2011, 1:35 PM

## 2011-10-26 NOTE — Progress Notes (Signed)
   CARE MANAGEMENT NOTE 10/26/2011  Patient:  Jennifer [name], Jennifer [name]   Account Number:  [id number]  Date Initiated:  10/24/2011  Documentation initiated by:  Lanier Clam  Subjective/Objective Assessment:   ADMITTED W/N/V,FEVER.RECENT STONE EXTRACTION,& REMOVAL OF DOUBLE J STENT.     Action/Plan:   FROM HOME ALONE.   Anticipated DC Date:  10/28/2011   Anticipated DC Plan:  HOME/SELF CARE         Choice offered to / List presented to:             Status of service:  Completed, signed off Medicare Important Message given?   (If response is "NO", the following Medicare IM given date fields will be blank) Date Medicare IM given:   Date Additional Medicare IM given:    Discharge Disposition:  HOME/SELF CARE  Per UR Regulation:  Reviewed for med. necessity/level of care/duration of stay  If discussed at Long Length of Stay Meetings, dates discussed:    Comments:  10/24/11 [facility] RN,BSN NCM [phone]

## 2011-10-26 NOTE — Progress Notes (Signed)
Afebrile.  V/S stable Feels much better today. No nausea, vomiting or diarrhea. Tolerates diet well. Voids well.  Urine clear OK for discharge from urologic standpoint n oral antibiotics. Has follow-up appointment at our office.

## 2011-10-30 LAB — CULTURE, BLOOD (ROUTINE X 2): Culture: NO GROWTH

## 2011-11-02 ENCOUNTER — Encounter (HOSPITAL_COMMUNITY): Payer: Self-pay | Admitting: Urology

## 2012-02-29 ENCOUNTER — Telehealth: Payer: Self-pay

## 2012-02-29 ENCOUNTER — Ambulatory Visit (INDEPENDENT_AMBULATORY_CARE_PROVIDER_SITE_OTHER): Payer: Managed Care, Other (non HMO) | Admitting: Internal Medicine

## 2012-02-29 ENCOUNTER — Ambulatory Visit: Payer: Managed Care, Other (non HMO)

## 2012-02-29 VITALS — BP 134/79 | HR 71 | Temp 98.0°F | Resp 18 | Ht 67.0 in | Wt 217.0 lb

## 2012-02-29 DIAGNOSIS — M654 Radial styloid tenosynovitis [de Quervain]: Secondary | ICD-10-CM

## 2012-02-29 DIAGNOSIS — M25532 Pain in left wrist: Secondary | ICD-10-CM

## 2012-02-29 DIAGNOSIS — R197 Diarrhea, unspecified: Secondary | ICD-10-CM

## 2012-02-29 DIAGNOSIS — R109 Unspecified abdominal pain: Secondary | ICD-10-CM

## 2012-02-29 DIAGNOSIS — M25539 Pain in unspecified wrist: Secondary | ICD-10-CM

## 2012-02-29 DIAGNOSIS — K529 Noninfective gastroenteritis and colitis, unspecified: Secondary | ICD-10-CM

## 2012-02-29 LAB — CBC WITH DIFFERENTIAL/PLATELET
Eosinophils Relative: 3 % (ref 0–5)
HCT: 45.2 % (ref 36.0–46.0)
Hemoglobin: 15.6 g/dL — ABNORMAL HIGH (ref 12.0–15.0)
Lymphocytes Relative: 38 % (ref 12–46)
MCHC: 34.5 g/dL (ref 30.0–36.0)
MCV: 83.1 fL (ref 78.0–100.0)
Monocytes Absolute: 0.7 10*3/uL (ref 0.1–1.0)
Monocytes Relative: 6 % (ref 3–12)
Neutro Abs: 6.1 10*3/uL (ref 1.7–7.7)
RDW: 14 % (ref 11.5–15.5)
WBC: 11.7 10*3/uL — ABNORMAL HIGH (ref 4.0–10.5)

## 2012-02-29 LAB — SEDIMENTATION RATE: Sed Rate: 1 mm/hr (ref 0–22)

## 2012-02-29 LAB — COMPREHENSIVE METABOLIC PANEL
Albumin: 4.1 g/dL (ref 3.5–5.2)
BUN: 17 mg/dL (ref 6–23)
Calcium: 9.1 mg/dL (ref 8.4–10.5)
Chloride: 107 mEq/L (ref 96–112)
Creat: 0.64 mg/dL (ref 0.50–1.10)
Glucose, Bld: 91 mg/dL (ref 70–99)
Potassium: 3.6 mEq/L (ref 3.5–5.3)

## 2012-02-29 LAB — TSH: TSH: 0.696 u[IU]/mL (ref 0.350–4.500)

## 2012-02-29 MED ORDER — BELLADONNA ALK-PHENOBARBITAL 16.2 MG PO TABS: ORAL_TABLET | ORAL | Status: DC

## 2012-02-29 NOTE — Progress Notes (Signed)
  Subjective:    Patient ID: Jennifer [name], female    DOB: 11-01-1965, 46 y.o.   MRN: [id number]  HPI  Yellow diarrhea since March following med intervention-started w/ stone-stent-sepsis 3/13 And has continued ever since/negative C. Difficile in hospital Now 1- 6 times a day/occas wakes w/ pain then diarrhea-pain all over belly occas back pressure//postprand diarrhea common/No melena or hematochezia Recently incontinence of liquid at times  No wt loss, fever, night sweats, dysuria Poor appetite Gas!!! No htburn  antibio-sores mouth ,feet then resolved  Also c/o pain L wrist 11/13-Dr hopper splinted-better, but relapsed--hurts to pick up things/No redness or swelling  Past medical history -paxil-12 years at least/Stable without side effects  Endosc several yrs ago for esoph polyps -Dr Tyrone Schimke asympto  History of rosacea/history of smoking/low HDL  Review of Systems  Constitutional: Positive for activity change, appetite change and fatigue. Negative for fever, chills, diaphoresis and unexpected weight change.  HENT: Negative for rhinorrhea and voice change.   Gastrointestinal: Negative for vomiting, anal bleeding and rectal pain.  Genitourinary: Negative for dysuria, urgency, frequency and difficulty urinating.  Musculoskeletal: Negative for myalgias, joint swelling and arthralgias.  Skin: Negative for pallor and rash.  Neurological: Negative for light-headedness and headaches.  Hematological: Negative for adenopathy. Does not bruise/bleed easily.  Psychiatric/Behavioral: Negative for disturbed wake/sleep cycle and dysphoric mood.       Objective:   Physical Exam Overweight In no acute distress Vital signs within normal limits No thyromegaly No Bronchospasm Abdomen soft/bowel sounds present/upper midline scar from hernia repair/no hepatomegaly/no splenomegaly/mildly tender in epigastrium/moderately tender left lower quadrant without masses/no rebound tenderness/no  bruit/Palpation of left lower quadrant causes pressure pain in left lumbosacral area Extremities without edema Left wrist with tenderness along the first extensor tendon into the first CMC/positive Finkelstein's/radial and ulnar deviation normal      [facility] reading (PRIMARY) by  Dr.Jamyra Zweig=negative for [facility] degen changes   Assessment & Plan:   1. Chronic diarrhea  IFOBT POC (occult bld, rslt in office), TSH  2. Abdominal pain due to #1 CBC with Differential, Comprehensive metabolic panel, Sedimentation rate   3. Left wrist pain =DeQ Tenosynovitis DG Wrist 2 Views Left=wnl--To use a thumb spica splint during activity for 4-6 weeks/will refer her to hand and rehabilitation for specific hand therapy/if no resolution in 6-12 weeks will inject    Plan-ALIGN qd Donnatal ac/hs for 2 weeks Will set up evaluation with Dr. Adair Laundry feel that colonoscopy is indicated if she does not respond to the above simple intervention and if initial labs are WNL

## 2012-03-01 ENCOUNTER — Encounter: Payer: Self-pay | Admitting: Internal Medicine

## 2012-03-01 LAB — IFOBT (OCCULT BLOOD): IFOBT: NEGATIVE

## 2012-03-05 ENCOUNTER — Encounter: Payer: Self-pay | Admitting: Internal Medicine

## 2012-03-16 ENCOUNTER — Telehealth: Payer: Self-pay

## 2012-03-16 NOTE — Telephone Encounter (Signed)
Pt is calling and needs dr Merla Riches to fill out form that is in his box and fax back to hand and rehabilitaiton this is the 3rd request

## 2012-04-10 ENCOUNTER — Other Ambulatory Visit: Payer: Managed Care, Other (non HMO)

## 2012-04-10 ENCOUNTER — Ambulatory Visit (INDEPENDENT_AMBULATORY_CARE_PROVIDER_SITE_OTHER): Payer: Managed Care, Other (non HMO) | Admitting: Internal Medicine

## 2012-04-10 ENCOUNTER — Encounter: Payer: Self-pay | Admitting: Internal Medicine

## 2012-04-10 VITALS — BP 136/68 | HR 88 | Ht 66.5 in | Wt 217.0 lb

## 2012-04-10 DIAGNOSIS — K529 Noninfective gastroenteritis and colitis, unspecified: Secondary | ICD-10-CM

## 2012-04-10 DIAGNOSIS — R197 Diarrhea, unspecified: Secondary | ICD-10-CM

## 2012-04-10 MED ORDER — ALIGN PO CAPS: 1.0000 | ORAL_CAPSULE | Freq: Every day | ORAL | Status: DC

## 2012-04-10 NOTE — Patient Instructions (Addendum)
We have given you samples of Align. This puts good bacteria back into your colon. You should take 1 capsule by mouth once daily. If this works well for you, it can be purchased over the counter.  Your physician has requested that you go to the basement for the following lab work before leaving today: Stool studies  You may take up to 4 Imodium a day to help with your diarrhea.  Thank you for choosing me and  Gastroenterology.  Iva Boop, M.D., [facility]

## 2012-04-10 NOTE — Progress Notes (Signed)
Subjective:    Patient ID: Jennifer [name], female    DOB: April 13, 1966, 46 y.o.   MRN: [id number] Referred by: Tonye Pearson, MD  HPI This is a very pleasant married 46 year old white woman here to discuss problems with chronic diarrhea. She was hospitalized in March after she had a double-J stent placed for nephrolithiasis. She had urosepsis. She had been treated with multiple antibiotics. She developed diarrhea while there and a C. difficile PCR test was negative. She never fully recovered but suspect the diarrhea has recurrent episodic urgent defecation throughout the day and sometimes nocturnal. It is usually preceded by lower cramps. She has stress fecal incontinence at times. Her CBC has been normal, her primary care physician has treated her with a line which helped somewhat she thought. She is off of that now. She has not used antidiarrheals. She denies bleeding or fever at this point. She has not had problems like this before.  GI review of systems is otherwise negative.   Allergies  Allergen Reactions  . Nitrofurantoin Monohyd Macro Other (See Comments)    Body aches, nausea and vomiting   Outpatient Prescriptions Prior to Visit  Medication Sig Dispense Refill  . PARoxetine (PAXIL) 20 MG tablet Take 20 mg by mouth every morning.      . promethazine (PHENERGAN) 25 MG tablet Take 25 mg by mouth every 6 (six) hours as needed. For nausea      . atropine-PHENObarbital-scopolamine-hyoscyamine (DONNATAL) 16.2 MG tablet One ac and hs for 2 weeks  60 tablet  0   Past Medical History  Diagnosis Date  . Depression   . kidney stone   . Tenosynovitis, de Quervain   . Rosacea   . Fundic gland polyps of stomach, benign   . Low HDL (under 40)   . Urosepsis 10/2011    after stent   Past Surgical History  Procedure Date  . Tubal ligation   . Oophorectomy     left ovary  . Vaginal hysterectomy   . Umbilical hernia repair   . Wisdom tooth extraction   . Cystoscopy w/ ureteral stent  placement 10/14/2011    Procedure: CYSTOSCOPY WITH RETROGRADE PYELOGRAM/URETERAL STENT PLACEMENT;  Surgeon: Lindaann Slough, MD;  Location: [facility];  Service: Urology;  Laterality: Right;  Right Double J Stent  . Upper gastrointestinal endoscopy 2008   History   Social History  . Marital Status: Married    Spouse Name: N/A    Number of Children: 2  .     Occupational History  . Reciever [employer]   Social History Main Topics  . Smoking status: Current Everyday Smoker -- 0.5 packs/day  . Smokeless tobacco: Never Used  . Alcohol Use: No  . Drug Use: No  . 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 Anesthesia problems Neg Hx   . Hypotension Neg Hx   . Malignant hyperthermia Neg Hx   . Pseudochol deficiency Neg Hx       Review of Systems This is positive for those things mentioned in the history of present illness. All other review of systems are negative at this time.    Objective:   Physical Exam General:  Well-developed, well-nourished and in  no acute distress Eyes:  anicteric. ENT:   Mouth and posterior pharynx free of lesions.  Neck:   supple w/o thyromegaly or mass.  Lungs: Clear to auscultation bilaterally. Heart:  S1S2, no rubs, murmurs, gallops. Abdomen:  soft, mildly tender in the lower quadrants without rebound or guarding , no hepatosplenomegaly, hernia, or mass and BS+.  Lymph:  no cervical or supraclavicular adenopathy. Extremities:   no edema Skin   no rash though she has facial plethora or erythema and erythema of the suprasternal area. Neuro:  A&O x 3.  Psych:  appropriate mood and  Affect.   Data Reviewed: Hospital discharge summary, operative note, laboratory testing, recent primary care note of July She was iFOBT  negative Lab Results  Component Value Date   WBC 11.7* 02/29/2012   HGB 15.6* 02/29/2012   HCT 45.2 02/29/2012   MCV 83.1 02/29/2012   PLT 303 02/29/2012     Chemistry      Component Value Date/Time   NA 140 02/29/2012 1325   K 3.6 02/29/2012 1325   CL 107 02/29/2012 1325   CO2 21 02/29/2012 1325   BUN 17 02/29/2012 1325   CREATININE 0.64 02/29/2012 1325   CREATININE 0.84 10/24/2011 0457      Component Value Date/Time   CALCIUM 9.1 02/29/2012 1325   ALKPHOS 81 02/29/2012 1325   AST 18 02/29/2012 1325   ALT 26 02/29/2012 1325   BILITOT 0.3 02/29/2012 1325          Assessment & Plan:   1. Chronic diarrhea     1. This certainly seems related to her hospitalization and multiple antibiotics. She was C. difficile negative but she still could have that or some other type of antibiotic associated colitis. 2. We'll check C. difficile PCR again, stool culture and stool white cells 3. Use align and Imodium at this point 4. If the stool workup is all negative the options will be empiric metronidazole or Xifaxan, versus going to a colonoscopy. We discussed all of that including the possible risks and complications of the colonoscopy and will make that decision together once the results are in.  I appreciate the opportunity to care for this patient.   CC: DOOLITTLE, Harrel Lemon, MD

## 2012-04-11 LAB — CLOSTRIDIUM DIFFICILE BY PCR: Toxigenic C. Difficile by PCR: NOT DETECTED

## 2012-04-13 ENCOUNTER — Telehealth: Payer: Self-pay | Admitting: Internal Medicine

## 2012-04-13 NOTE — Telephone Encounter (Signed)
Patient advised that so far the results are negative and we will call with the results next week.

## 2012-04-17 NOTE — Progress Notes (Signed)
Quick Note:  Please check on status of fecal lactoferrin Not yet reported  ______

## 2012-04-19 ENCOUNTER — Telehealth: Payer: Self-pay | Admitting: Internal Medicine

## 2012-04-19 NOTE — Telephone Encounter (Signed)
I recommend a colonoscopy to evaluate diarrhe

## 2012-04-19 NOTE — Telephone Encounter (Signed)
Patient advised that all studies were normal.  I have put the results and of the fecal lactoferrin on your desk.  She is wondering what the next step is.  Per your note antibiotics vs colonoscopy.  She is still having diarrhea and discomfort.

## 2012-04-20 ENCOUNTER — Telehealth: Payer: Self-pay | Admitting: Physician Assistant

## 2012-04-20 ENCOUNTER — Ambulatory Visit (INDEPENDENT_AMBULATORY_CARE_PROVIDER_SITE_OTHER): Payer: Managed Care, Other (non HMO) | Admitting: Internal Medicine

## 2012-04-20 ENCOUNTER — Ambulatory Visit
Admission: RE | Admit: 2012-04-20 | Discharge: 2012-04-20 | Disposition: A | Discharge status: Home / Self Care | Payer: Managed Care, Other (non HMO) | Source: Ambulatory Visit | Attending: Internal Medicine | Admitting: Internal Medicine

## 2012-04-20 VITALS — BP 120/86 | HR 69 | Temp 98.4°F | Resp 16 | Ht 66.5 in | Wt 215.0 lb

## 2012-04-20 DIAGNOSIS — R197 Diarrhea, unspecified: Secondary | ICD-10-CM

## 2012-04-20 DIAGNOSIS — R110 Nausea: Secondary | ICD-10-CM

## 2012-04-20 DIAGNOSIS — R1032 Left lower quadrant pain: Secondary | ICD-10-CM

## 2012-04-20 DIAGNOSIS — R300 Dysuria: Secondary | ICD-10-CM

## 2012-04-20 LAB — POCT UA - MICROSCOPIC ONLY
Casts, Ur, LPF, POC: NEGATIVE
Crystals, Ur, HPF, POC: NEGATIVE
Mucus, UA: NEGATIVE
Yeast, UA: NEGATIVE

## 2012-04-20 LAB — POCT CBC
Granulocyte percent: 61.4 %G (ref 37–80)
HCT, POC: 51.6 % — AB (ref 37.7–47.9)
POC Granulocyte: 7.4 — AB (ref 2–6.9)
POC LYMPH PERCENT: 32.4 %L (ref 10–50)
RBC: 5.71 M/uL — AB (ref 4.04–5.48)
RDW, POC: 14.6 %

## 2012-04-20 LAB — POCT URINALYSIS DIPSTICK
Bilirubin, UA: NEGATIVE
Nitrite, UA: NEGATIVE
Protein, UA: 100
Urobilinogen, UA: 0.2
pH, UA: 5.5

## 2012-04-20 MED ORDER — IOHEXOL 300 MG/ML  SOLN
100.0000 mL | Freq: Once | INTRAMUSCULAR | Status: AC | PRN
  Administered 2012-04-20: 100 mL via INTRAVENOUS

## 2012-04-20 MED ORDER — IOHEXOL 300 MG/ML  SOLN
30.0000 mL | Freq: Once | INTRAMUSCULAR | Status: AC | PRN
  Administered 2012-04-20: 30 mL via ORAL

## 2012-04-20 MED ORDER — HYDROCODONE-ACETAMINOPHEN 5-500 MG PO TABS: 1.0000 | ORAL_TABLET | Freq: Three times a day (TID) | ORAL | Status: AC | PRN

## 2012-04-20 MED ORDER — KETOROLAC TROMETHAMINE 60 MG/2ML IM SOLN
60.0000 mg | Freq: Once | INTRAMUSCULAR | Status: AC
  Administered 2012-04-20: 60 mg via INTRAMUSCULAR

## 2012-04-20 NOTE — Telephone Encounter (Signed)
Patient's husband reports that she is getting a CT scan for possible nephrolithiasis now.  He will have her call back when she gets home or next Monday

## 2012-04-20 NOTE — Telephone Encounter (Signed)
Patient's husband called and wants to know what his wife should take for pain.  I discussed with Dr. Merla Riches.  He has sent the patient for an urgent scan to evaluate for nephrolithiasis today, and was under the impression that she has plenty of oxycodone at home left over from a previous prescription from someone else.  Please call and clarify if the patient has pain medication.  He'll make the decision to prescribe with the results of the scan.

## 2012-04-20 NOTE — Telephone Encounter (Signed)
Left message for patient to call back  

## 2012-04-20 NOTE — Telephone Encounter (Signed)
Dr Merla Riches has taken care of this. I called the hydrocodone in and the report of CT scan was rc'd

## 2012-04-20 NOTE — Progress Notes (Signed)
  Subjective:    Patient ID: Jennifer [name], female    DOB: 08-20-1965, 46 y.o.   MRN: [id number]  HPI    Review of Systems     Objective:   Physical Exam    Results for orders placed in visit on 04/20/12  POCT UA - MICROSCOPIC ONLY      Component Value Range   WBC, Ur, HPF, POC 2-5     RBC, urine, microscopic 3-5     Bacteria, U Microscopic trace     Mucus, UA neg     Epithelial cells, urine per micros 0-1     Crystals, Ur, HPF, POC neg     Casts, Ur, LPF, POC neg     Yeast, UA neg    POCT URINALYSIS DIPSTICK      Component Value Range   Color, UA yellow     Clarity, UA clear     Glucose, UA neg     Bilirubin, UA neg     Ketones, UA neg     Spec Grav, UA 1.025     Blood, UA trace-intact     pH, UA 5.5     Protein, UA 100     Urobilinogen, UA 0.2     Nitrite, UA neg     Leukocytes, UA Negative    POCT CBC      Component Value Range   WBC 12.0 (*) 4.6 - 10.2 K/uL   Lymph, poc 3.9 (*) 0.6 - 3.4   POC LYMPH PERCENT 32.4  10 - 50 %L   MID (cbc) 0.7  0 - 0.9   POC MID % 6.2  0 - 12 %M   POC Granulocyte 7.4 (*) 2 - 6.9   Granulocyte percent 61.4  37 - 80 %G   RBC 5.71 (*) 4.04 - 5.48 M/uL   Hemoglobin 16.0  12.2 - 16.2 g/dL   HCT, POC 34.7 (*) 42.5 - 47.9 %   MCV 90.4  80 - 97 fL   MCH, POC 28.0  27 - 31.2 pg   MCHC 31.0 (*) 31.8 - 35.4 g/dL   RDW, POC 95.6     Platelet Count, POC 340  142 - 424 K/uL   MPV 8.5  0 - 99.8 fL       Assessment & Plan:  appt with Dr. Brunilda Payor next month

## 2012-04-20 NOTE — Progress Notes (Addendum)
Subjective:    Patient ID: Jennifer [name], female    DOB: 01-Jan-1966, 46 y.o.   MRN: [id number]  HPI presents today with Sudden onset of diarrhea, LLQ abd pain, nausea, and dysuria x 2 days. No fever. No hematuria or hematochezia and. The abd pain is a throbbing pain and constant. The pain eases after diarrhea. Hx chronic diarrhea since antibiotics in March/improved-resolved. Eval Dr Leone Payor negative to date.    Other prob= Patient Active Problem List  Diagnosis  . LOW HDL  . OBESITY  . SMOKER  . DEPRESSION  . MENORRHAGIA  . ECZEMA  . ROSACEA  . SEBORRHEIC KERATOSIS, INFLAMED   He had nephrolithiasis requiring lithotripsy in March prior to the onset of her diarrhea-Dr. Brunilda Payor Review of Systems Noncontributory    Objective:   Physical Exam In obvious discomfort Vital signs stable Left flank tender to percussion Left lower quadrant tenderness to palpation With marked guarding With consideration of kidney stone as an etiology she was given 60 mg of Toradol IM and her pain resolved over the next 30 minutes Reexam of the abdomen showed discomfort in the left lower quadrant Reduced but still present to deep palpation/no guarding and no rebound       Results for orders placed in visit on 04/20/12  POCT UA - MICROSCOPIC ONLY      Component Value Range   WBC, Ur, HPF, POC 2-5     RBC, urine, microscopic 3-5     Bacteria, U Microscopic trace     Mucus, UA neg     Epithelial cells, urine per micros 0-1     Crystals, Ur, HPF, POC neg     Casts, Ur, LPF, POC neg     Yeast, UA neg    POCT URINALYSIS DIPSTICK      Component Value Range   Color, UA yellow     Clarity, UA clear     Glucose, UA neg     Bilirubin, UA neg     Ketones, UA neg     Spec Grav, UA 1.025     Blood, UA trace-intact     pH, UA 5.5     Protein, UA 100     Urobilinogen, UA 0.2     Nitrite, UA neg     Leukocytes, UA Negative    POCT CBC      Component Value Range   WBC 12.0 (*) 4.6 - 10.2 K/uL   Lymph, poc 3.9 (*) 0.6 - 3.4   POC LYMPH PERCENT 32.4  10 - 50 %L   MID (cbc) 0.7  0 - 0.9   POC MID % 6.2  0 - 12 %M   POC Granulocyte 7.4 (*) 2 - 6.9   Granulocyte percent 61.4  37 - 80 %G   RBC 5.71 (*) 4.04 - 5.48 M/uL   Hemoglobin 16.0  12.2 - 16.2 g/dL   HCT, POC 40.9 (*) 81.1 - 47.9 %   MCV 90.4  80 - 97 fL   MCH, POC 28.0  27 - 31.2 pg   MCHC 31.0 (*) 31.8 - 35.4 g/dL   RDW, POC 91.4     Platelet Count, POC 340  142 - 424 K/uL   MPV 8.5  0 - 99.8 fL  POCT SEDIMENTATION RATE      Component Value Range   POCT SED RATE 10  0 - 22 mm/hr    Assessment & Plan:  Problem #1 left lower quadrant abdominal pain Problem #2  history of nephrolithiasis Problem #3 diarrhea acute onset Problem #4 history of chronic diarrhea  Urgent abdominal CT with contrast set up to rule out nephrolithiasis, inflammatory bowel disease, and acute diverticulitis Stool culture repeated Urine culture done Will call with results and plan   Addendum= CT showed no etiology Colonoscopy is next Called in Vicodin 5 500 #20 2 use Imodium Followup Sunday if not better

## 2012-04-20 NOTE — Addendum Note (Signed)
Addended by: Tonye Pearson on: 04/20/2012 04:36 PM   Modules accepted: Orders

## 2012-04-20 NOTE — Patient Instructions (Addendum)
    Driving directions to [address], [place], [place] [address] [id number]  - more info    [address]  [place], [place] [address]     1. Head south on [address] toward [address]      0.5 mi    2. Sharp left onto [address]      0.6 mi    3. Turn left onto the [address] ramp      0.2 mi    4. Merge onto [address]      3.0 mi    5. Continue straight to stay on [address]      0.4 mi    6. Slight left to stay on [address]  Destination will be on the right     1.0 mi     [address]  [place],  [address]    

## 2012-04-21 NOTE — Progress Notes (Signed)
Quick Note:  CT was ok  I have recommended a colonoscopy on previous notes ______

## 2012-04-22 ENCOUNTER — Telehealth: Payer: Self-pay

## 2012-04-22 NOTE — Telephone Encounter (Signed)
Pt dropped of a specimen and she wanted to let dr Merla Riches know she is feeling much better today

## 2012-04-23 LAB — URINE CULTURE

## 2012-04-23 NOTE — Telephone Encounter (Signed)
Patient is scheduled for colon 05/03/12

## 2012-04-26 LAB — STOOL CULTURE

## 2012-04-27 ENCOUNTER — Ambulatory Visit (AMBULATORY_SURGERY_CENTER): Payer: Managed Care, Other (non HMO) | Admitting: *Deleted

## 2012-04-27 VITALS — Ht 68.0 in | Wt 214.0 lb

## 2012-04-27 DIAGNOSIS — R197 Diarrhea, unspecified: Secondary | ICD-10-CM

## 2012-04-27 DIAGNOSIS — Z1211 Encounter for screening for malignant neoplasm of colon: Secondary | ICD-10-CM

## 2012-04-27 MED ORDER — SUPREP BOWEL PREP KIT 17.5-3.13-1.6 GM/177ML PO SOLN: ORAL | Status: DC

## 2012-04-30 ENCOUNTER — Encounter: Payer: Self-pay | Admitting: Internal Medicine

## 2012-04-30 ENCOUNTER — Encounter: Payer: Self-pay | Admitting: Gastroenterology

## 2012-04-30 ENCOUNTER — Encounter: Payer: Self-pay | Admitting: *Deleted

## 2012-04-30 DIAGNOSIS — M654 Radial styloid tenosynovitis [de Quervain]: Secondary | ICD-10-CM | POA: Insufficient documentation

## 2012-05-03 ENCOUNTER — Ambulatory Visit (AMBULATORY_SURGERY_CENTER): Payer: Managed Care, Other (non HMO) | Admitting: Internal Medicine

## 2012-05-03 ENCOUNTER — Encounter: Payer: Self-pay | Admitting: Internal Medicine

## 2012-05-03 VITALS — BP 107/65 | HR 62 | Temp 99.3°F | Resp 18 | Ht 68.0 in | Wt 214.0 lb

## 2012-05-03 DIAGNOSIS — D126 Benign neoplasm of colon, unspecified: Secondary | ICD-10-CM

## 2012-05-03 DIAGNOSIS — K5730 Diverticulosis of large intestine without perforation or abscess without bleeding: Secondary | ICD-10-CM

## 2012-05-03 DIAGNOSIS — R197 Diarrhea, unspecified: Secondary | ICD-10-CM

## 2012-05-03 MED ORDER — SODIUM CHLORIDE 0.9 % IV SOLN: 500.0000 mL | INTRAVENOUS | Status: DC

## 2012-05-03 NOTE — Patient Instructions (Addendum)
Two small polyps were removed. The colon lining looked normal - biopsies were taken to look for microscopic colitis. You also have diverticulosis (mild). Not a problem.  My office should be calling with results and plans by next week.  Thank you for choosing me and [facility].  Iva Boop, MD, FACG  YOU HAD AN ENDOSCOPIC PROCEDURE TODAY AT THE [facility]: Refer to the procedure report that was given to you for any specific questions about what was found during the examination.  If the procedure report does not answer your questions, please call your gastroenterologist to clarify.  If you requested that your care partner not be given the details of your procedure findings, then the procedure report has been included in a sealed envelope for you to review at your convenience later.  YOU SHOULD EXPECT: Some feelings of bloating in the abdomen. Passage of more gas than usual.  Walking can help get rid of the air that was put into your GI tract during the procedure and reduce the bloating. If you had a lower endoscopy (such as a colonoscopy or flexible sigmoidoscopy) you may notice spotting of blood in your stool or on the toilet paper. If you underwent a bowel prep for your procedure, then you may not have a normal bowel movement for a few days.  DIET: Your first meal following the procedure should be a light meal and then it is ok to progress to your normal diet.  A half-sandwich or bowl of soup is an example of a good first meal.  Heavy or fried foods are harder to digest and may make you feel nauseous or bloated.  Likewise meals heavy in dairy and vegetables can cause extra gas to form and this can also increase the bloating.  Drink plenty of fluids but you should avoid alcoholic beverages for 24 hours.  ACTIVITY: Your care partner should take you home directly after the procedure.  You should plan to take it easy, moving slowly for the rest of the day.  You can resume  normal activity the day after the procedure however you should NOT DRIVE or use heavy machinery for 24 hours (because of the sedation medicines used during the test).    SYMPTOMS TO REPORT IMMEDIATELY: A gastroenterologist can be reached at any hour.  During normal business hours, 8:30 AM to 5:00 PM Monday through Friday, call [phone].  After hours and on weekends, please call the GI answering service at [phone] who will take a message and have the physician on call contact you.   Following lower endoscopy (colonoscopy or flexible sigmoidoscopy):  Excessive amounts of blood in the stool  Significant tenderness or worsening of abdominal pains  Swelling of the abdomen that is new, acute  Fever of 100F or higher  Following upper endoscopy (EGD)  Vomiting of blood or coffee ground material  New chest pain or pain under the shoulder blades  Painful or persistently difficult swallowing  New shortness of breath  Fever of 100F or higher  Black, tarry-looking stools  FOLLOW UP: If any biopsies were taken you will be contacted by phone or by letter within the next 1-3 weeks.  Call your gastroenterologist if you have not heard about the biopsies in 3 weeks.  Our staff will call the home number listed on your records the next business day following your procedure to check on you and address any questions or concerns that you may have at that time regarding the  information given to you following your procedure. This is a courtesy call and so if there is no answer at the home number and we have not heard from you through the emergency physician on call, we will assume that you have returned to your regular daily activities without incident.  SIGNATURES/CONFIDENTIALITY: You and/or your care partner have signed paperwork which will be entered into your electronic medical record.  These signatures attest to the fact that that the information above on your After Visit Summary has been reviewed  and is understood.  Full responsibility of the confidentiality of this discharge information lies with you and/or your care-partner.   Diverticulosis, high fiber diet, polyp information given.

## 2012-05-03 NOTE — Progress Notes (Signed)
Patient did not experience any of the following events: a burn prior to discharge; a fall within the facility; wrong site/side/patient/procedure/implant event; or a hospital transfer or hospital admission upon discharge from the facility. (G8907) Patient did not have preoperative order for IV antibiotic SSI prophylaxis. (G8918)  

## 2012-05-03 NOTE — Progress Notes (Signed)
PROPOFOL PER J MULLINS CRNA, ALL MEDS TITRATED PER CRNA DURING PROCEDURE. SEE SCANNED INTRA PROCEDURE REPORT. EWM

## 2012-05-03 NOTE — Op Note (Signed)
[facility] [address] [place], [address]   COLONOSCOPY PROCEDURE REPORT  PATIENT: Jennifer [name], Jennifer [name].  MR#: [id number] BIRTHDATE: October 21, 1965 , 46  yrs. old GENDER: Female ENDOSCOPIST: Iva Boop, MD, [facility] REFERRED MV:[id number] Merla Riches, M.D. PROCEDURE DATE:  05/03/2012 PROCEDURE:   Colonoscopy with biopsy and Colonoscopy with snare polypectomy ASA CLASS:   Class II INDICATIONS:unexplained diarrhea. MEDICATIONS: Propofol (Diprivan) 210 mg IV, MAC sedation, administered by CRNA, and These medications were titrated to patient response per physician's verbal order  DESCRIPTION OF PROCEDURE:   After the risks benefits and alternatives of the procedure were thoroughly explained, informed consent was obtained.  A digital rectal exam revealed no abnormalities of the rectum.   The LB [id number]  endoscope was introduced through the anus and advanced to the cecum, which was identified by both the appendix and ileocecal valve. No adverse events experienced.   The quality of the prep was Suprep excellent The instrument was then slowly withdrawn as the colon was fully examined.      COLON FINDINGS: Two polyps measuring 5-7 mm in size were found at the splenic flexure and in the sigmoid colon.  A polypectomy was performed with a cold snare.  The resection was complete and the polyp tissue was completely retrieved.   Mild diverticulosis was noted in the sigmoid colon.   The colon mucosa was otherwise normal.  Retroflexed views revealed no abnormalities. The time to cecum=3 minutes 35 seconds.  Withdrawal time=12 minutes 54 seconds. The scope was withdrawn and the procedure completed. COMPLICATIONS: There were no complications.  ENDOSCOPIC IMPRESSION: 1.   Two polyps measuring 5-7 mm in size were found at the splenic flexure and in the sigmoid colon; polypectomy was performed with a cold snare 2.   Mild diverticulosis was noted in the sigmoid colon 3.    The colon mucosa was otherwise normal with excellent prep  RECOMMENDATIONS: Office will call with the results.  eSigned:  Iva Boop, MD, [facility] 05/03/2012 10:51 AM   cc: Ellamae Sia, MD and The Patient

## 2012-05-04 ENCOUNTER — Telehealth: Payer: Self-pay

## 2012-05-04 NOTE — Telephone Encounter (Signed)
  Follow up Call-  Call back number 05/03/2012  Post procedure Call Back phone  # [phone]  Permission to leave phone message Yes     Patient questions:  Do you have a fever, pain , or abdominal swelling? no Pain Score  0 *  Have you tolerated food without any problems? yes  Have you been able to return to your normal activities? yes  Do you have any questions about your discharge instructions: Diet   no Medications  no Follow up visit  no  Do you have questions or concerns about your Care? no  Actions: * If pain score is 4 or above: No action needed, pain <4.

## 2012-05-08 ENCOUNTER — Encounter: Payer: Self-pay | Admitting: Internal Medicine

## 2012-05-08 DIAGNOSIS — Z860100 Personal history of colon polyps, unspecified: Secondary | ICD-10-CM

## 2012-05-08 HISTORY — DX: Personal history of colonic polyps: Z86.010

## 2012-05-08 HISTORY — DX: Personal history of colon polyps, unspecified: Z86.0100

## 2012-05-08 NOTE — Progress Notes (Signed)
Quick Note:  Office  She does not have a colitis Polyps benign but pre-cancerous - needs repeat colon 5 years  Have her take metronidazole 500 mg tid x 10 days #30 no refills (? Bacterial overgrowth) Continue dicyclomine  See me in 1 month  LEC  5 year colon recall - no letter ______

## 2012-05-09 ENCOUNTER — Other Ambulatory Visit: Payer: Self-pay

## 2012-05-09 MED ORDER — METRONIDAZOLE 500 MG PO TABS: 500.0000 mg | ORAL_TABLET | Freq: Three times a day (TID) | ORAL | Status: DC

## 2012-06-19 ENCOUNTER — Encounter: Payer: Self-pay | Admitting: Internal Medicine

## 2012-06-19 ENCOUNTER — Ambulatory Visit (INDEPENDENT_AMBULATORY_CARE_PROVIDER_SITE_OTHER): Payer: Managed Care, Other (non HMO) | Admitting: Internal Medicine

## 2012-06-19 ENCOUNTER — Other Ambulatory Visit: Payer: Self-pay | Admitting: Obstetrics and Gynecology

## 2012-06-19 VITALS — BP 116/60 | HR 72 | Ht 67.0 in | Wt 217.0 lb

## 2012-06-19 DIAGNOSIS — R197 Diarrhea, unspecified: Secondary | ICD-10-CM

## 2012-06-19 DIAGNOSIS — R928 Other abnormal and inconclusive findings on diagnostic imaging of breast: Secondary | ICD-10-CM

## 2012-06-19 NOTE — Progress Notes (Signed)
Patient ID: Jennifer [name], female   DOB: May 28, 1966, 46 y.o.   MRN: [id number]   Patient returns after she was seen for diarrhea. Stool studies did not reveal any cause of infection. Because of the persistent problems we proceeded to a colonoscopy. She did have an adenomatous polyp, but random biopsies and other inspection did not reveal any evidence of colitis. Empiric metronidazole was prescribed. Over time she says she is back to 95% of normal and feels well overall.  Medications, allergies, past medical history, past surgical history, family history and social history are reviewed and updated in the EMR.   1. Diarrhea    1. Cause of her diarrhea was not entirely clear, I do think she could have some sort of antibiotic associated diarrhea or disruption that responded to metronidazole. Bacterial overgrowth is possible as well. 2. Says she is well, she will followup as needed.  CC: DOOLITTLE, Harrel Lemon, MD

## 2012-06-19 NOTE — Patient Instructions (Addendum)
Please follow up with Dr. Gessner as needed 

## 2012-06-26 ENCOUNTER — Ambulatory Visit
Admission: RE | Admit: 2012-06-26 | Discharge: 2012-06-26 | Disposition: A | Discharge status: Home / Self Care | Payer: Managed Care, Other (non HMO) | Source: Ambulatory Visit | Attending: Obstetrics and Gynecology | Admitting: Obstetrics and Gynecology

## 2012-06-26 DIAGNOSIS — R928 Other abnormal and inconclusive findings on diagnostic imaging of breast: Secondary | ICD-10-CM

## 2012-07-01 ENCOUNTER — Encounter: Payer: Self-pay | Admitting: Family Medicine

## 2012-07-20 ENCOUNTER — Other Ambulatory Visit: Payer: Self-pay | Admitting: Surgery

## 2012-12-17 ENCOUNTER — Ambulatory Visit (INDEPENDENT_AMBULATORY_CARE_PROVIDER_SITE_OTHER): Payer: Managed Care, Other (non HMO) | Admitting: Internal Medicine

## 2012-12-17 VITALS — BP 124/82 | HR 88 | Temp 98.2°F | Resp 20 | Ht 66.0 in | Wt 219.6 lb

## 2012-12-17 DIAGNOSIS — J9801 Acute bronchospasm: Secondary | ICD-10-CM

## 2012-12-17 DIAGNOSIS — R059 Cough, unspecified: Secondary | ICD-10-CM

## 2012-12-17 DIAGNOSIS — J019 Acute sinusitis, unspecified: Secondary | ICD-10-CM

## 2012-12-17 DIAGNOSIS — R05 Cough: Secondary | ICD-10-CM

## 2012-12-17 MED ORDER — PREDNISONE 20 MG PO TABS: ORAL_TABLET | ORAL | Status: DC

## 2012-12-17 MED ORDER — AMOXICILLIN 500 MG PO CAPS: 1000.0000 mg | ORAL_CAPSULE | Freq: Two times a day (BID) | ORAL | Status: AC

## 2012-12-17 MED ORDER — HYDROCODONE-HOMATROPINE 5-1.5 MG/5ML PO SYRP: 5.0000 mL | ORAL_SOLUTION | Freq: Four times a day (QID) | ORAL | Status: DC | PRN

## 2012-12-17 NOTE — Progress Notes (Signed)
  Subjective:    Patient ID: Jennifer [name], female    DOB: 1965-10-22, 47 y.o.   MRN: [id number]  HPI5d hx St leading to PND,sneezing,then congestion and nonprod cough Green nasal d/c Low grade fever 2 d Tight in chest    Review of Systems     Objective:   Physical Exam BP 124/82  Pulse 88  Temp(Src) 98.2 F (36.8 C) (Oral)  Resp 20  Ht 5\' 6"  (1.676 m)  Wt 219 lb 9.6 oz (99.61 kg)  BMI 35.46 kg/m2  SpO2 99% Conjunctiva injected TMs clear Nares boggy with purulent discharge Throat clear/no nodes Chest clear except wheezing on forced expiration-mild       Assessment & Plan:  Acute sinusitis, unspecified - Plan: amoxicillin (AMOXIL) 500 MG capsule  Acute bronchospasm - Plan: predniSONE (DELTASONE) 20 MG tablet  Cough - Plan: HYDROcodone-homatropine (HYCODAN) 5-1.5 MG/5ML syrup  Meds ordered this encounter  Medications         . amoxicillin (AMOXIL) 500 MG capsule    Sig: Take 2 capsules (1,000 mg total) by mouth 2 (two) times daily.    Dispense:  40 capsule    Refill:  0  . predniSONE (DELTASONE) 20 MG tablet    Sig: 4/4/3/3/2/2/1/1 single daily dose for 8 days    Dispense:  20 tablet    Refill:  0  . HYDROcodone-homatropine (HYCODAN) 5-1.5 MG/5ML syrup    Sig: Take 5 mLs by mouth every 6 (six) hours as needed for cough.    Dispense:  120 mL    Refill:  0

## 2012-12-22 ENCOUNTER — Telehealth: Payer: Self-pay

## 2012-12-22 DIAGNOSIS — R05 Cough: Secondary | ICD-10-CM

## 2012-12-22 NOTE — Telephone Encounter (Signed)
DOOLITTLE - WAS HERE Monday WITH BAD SINUS/EAR INFECTION.  HER FACE IS NOT SO SWOLLEN.  SHE IS STILL TAKING HER OTHER ANTIBIOTICS, BUT HER COUGH IS TERRIBLE.  SHE WANTS TO KNOW IF YOU COULD CALL IN ANYTHING ELSE FOR A COUGH. [phone]

## 2012-12-23 MED ORDER — HYDROCODONE-HOMATROPINE 5-1.5 MG/5ML PO SYRP: 5.0000 mL | ORAL_SOLUTION | Freq: Four times a day (QID) | ORAL | Status: DC | PRN

## 2012-12-23 NOTE — Telephone Encounter (Signed)
Meds ordered this encounter  Medications  . HYDROcodone-homatropine (HYCODAN) 5-1.5 MG/5ML syrup    Sig: Take 5 mLs by mouth every 6 (six) hours as needed for cough.    Dispense:  120 mL    Refill:  0

## 2012-12-23 NOTE — Telephone Encounter (Signed)
Can she have something for cough

## 2012-12-25 ENCOUNTER — Other Ambulatory Visit: Payer: Self-pay | Admitting: Internal Medicine

## 2013-09-26 ENCOUNTER — Encounter: Payer: Self-pay | Admitting: Internal Medicine

## 2014-05-07 IMAGING — CR DG WRIST 2V*L*
1 series · 1 of 1 positions shown · non-contrast
Comparison: None

CLINICAL DATA: Wrist pain

LEFT WRIST - 2 VIEW

[AP]
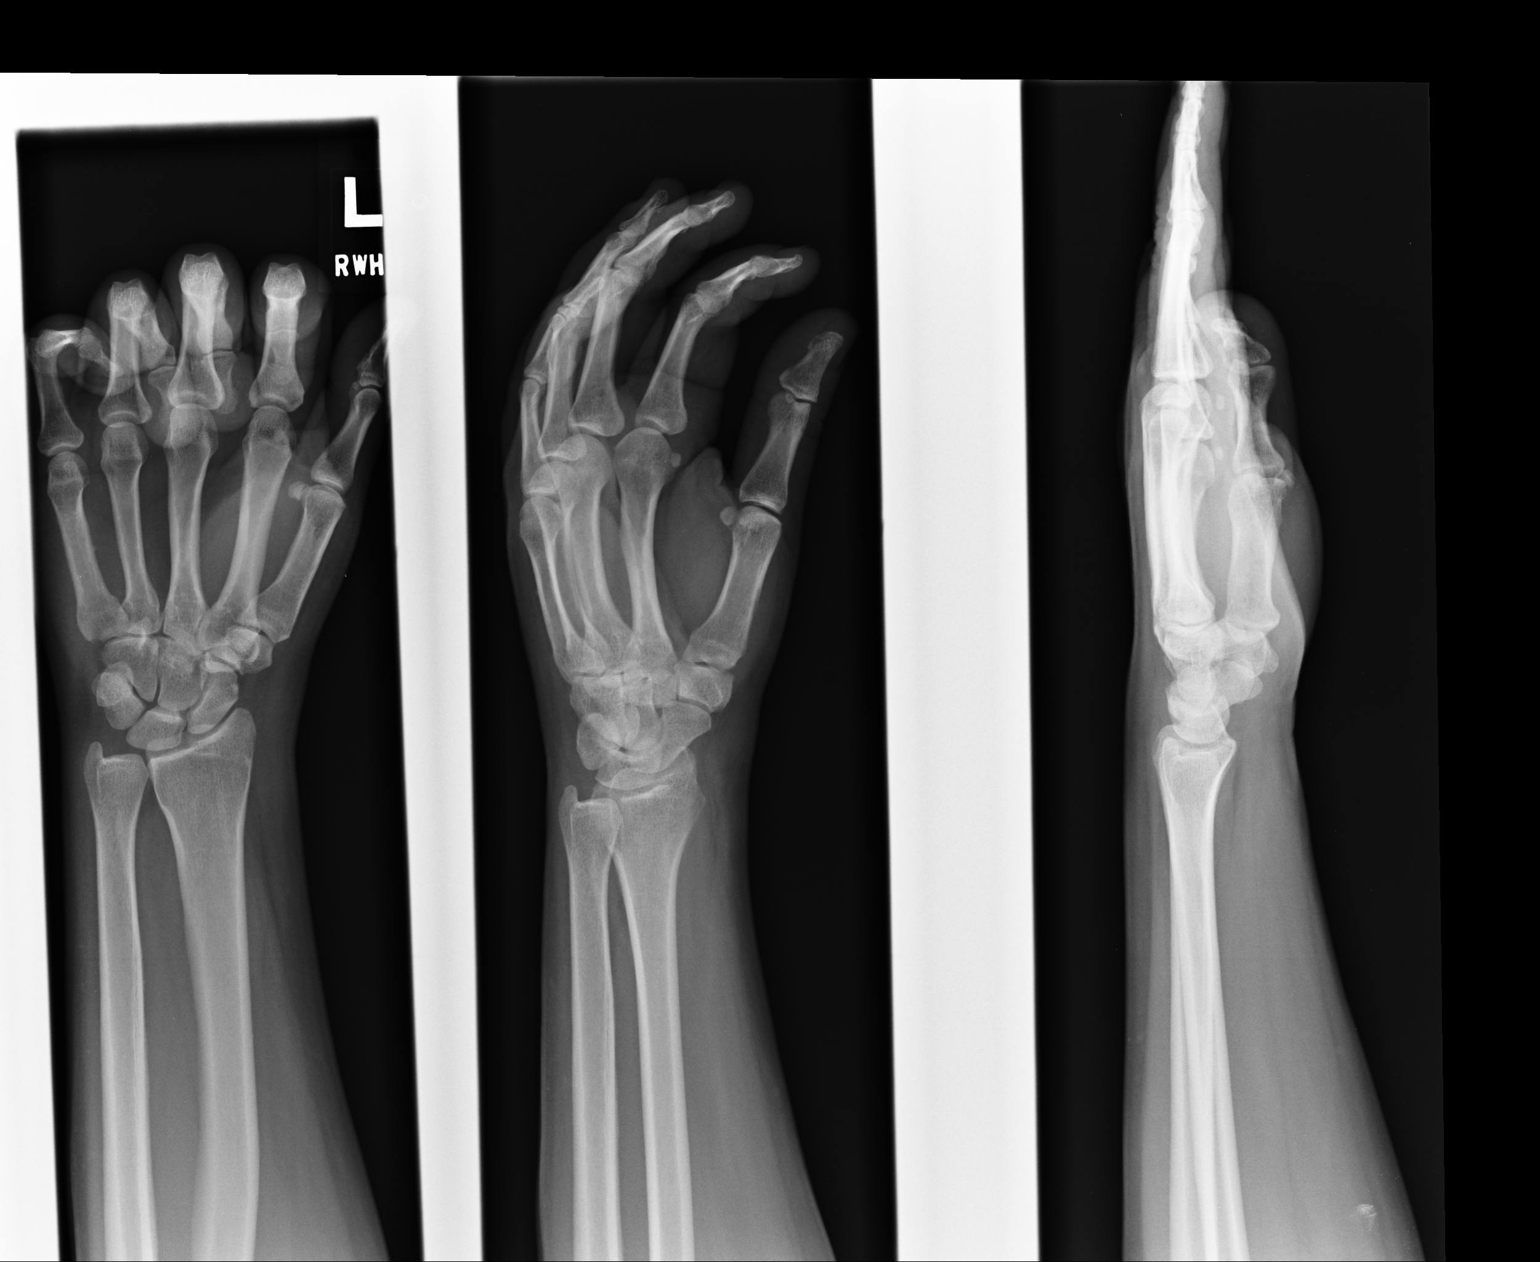

[1 of 1 positions shown; findings below may reference images not displayed]

FINDINGS: There is no evidence of fracture or dislocation.  There
is no evidence of arthropathy or other focal bone abnormality.
Soft tissues are unremarkable.
IMPRESSION: Negative exam.

Clinically significant discrepancy from primary report, if
provided: None

## 2014-05-16 ENCOUNTER — Encounter: Payer: Self-pay | Admitting: *Deleted

## 2014-05-26 ENCOUNTER — Ambulatory Visit (INDEPENDENT_AMBULATORY_CARE_PROVIDER_SITE_OTHER): Payer: Managed Care, Other (non HMO) | Admitting: Emergency Medicine

## 2014-05-26 VITALS — BP 120/71 | HR 73 | Temp 97.9°F | Resp 16 | Ht 67.0 in | Wt 210.0 lb

## 2014-05-26 DIAGNOSIS — M545 Low back pain, unspecified: Secondary | ICD-10-CM

## 2014-05-26 DIAGNOSIS — R42 Dizziness and giddiness: Secondary | ICD-10-CM

## 2014-05-26 DIAGNOSIS — Z205 Contact with and (suspected) exposure to viral hepatitis: Secondary | ICD-10-CM

## 2014-05-26 DIAGNOSIS — Z23 Encounter for immunization: Secondary | ICD-10-CM

## 2014-05-26 DIAGNOSIS — D72820 Lymphocytosis (symptomatic): Secondary | ICD-10-CM

## 2014-05-26 LAB — POCT CBC
GRANULOCYTE PERCENT: 55.3 % (ref 37–80)
HEMATOCRIT: 50.1 % — AB (ref 37.7–47.9)
Hemoglobin: 16.4 g/dL — AB (ref 12.2–16.2)
Lymph, poc: 5.8 — AB (ref 0.6–3.4)
MCH, POC: 28.8 pg (ref 27–31.2)
MCHC: 32.8 g/dL (ref 31.8–35.4)
MCV: 87.9 fL (ref 80–97)
MID (cbc): 0.3 (ref 0–0.9)
MPV: 7.1 fL (ref 0–99.8)
POC GRANULOCYTE: 7.6 — AB (ref 2–6.9)
POC LYMPH %: 42.3 % (ref 10–50)
POC MID %: 2.4 %M (ref 0–12)
Platelet Count, POC: 307 10*3/uL (ref 142–424)
RBC: 5.7 M/uL — AB (ref 4.04–5.48)
RDW, POC: 13.8 %
WBC: 13.8 10*3/uL — AB (ref 4.6–10.2)

## 2014-05-26 LAB — GLUCOSE, POCT (MANUAL RESULT ENTRY): POC GLUCOSE: 81 mg/dL (ref 70–99)

## 2014-05-26 MED ORDER — CYCLOBENZAPRINE HCL 5 MG PO TABS: ORAL_TABLET | ORAL | Status: DC

## 2014-05-26 MED ORDER — MELOXICAM 7.5 MG PO TABS: ORAL_TABLET | ORAL | Status: DC

## 2014-05-26 NOTE — Patient Instructions (Signed)
Temporomandibular Problems  Temporomandibular joint (TMJ) dysfunction means there are problems with the joint between your jaw and your skull. This is a joint lined by cartilage like other joints in your body but also has a small disc in the joint which keeps the bones from rubbing on each other. These joints are like other joints and can get inflamed (sore) from arthritis and other problems. When this joint gets sore, it can cause headaches and pain in the jaw and the face. CAUSES  Usually the arthritic types of problems are caused by soreness in the joint. Soreness in the joint can also be caused by overuse. This may come from grinding your teeth. It may also come from mis-alignment in the joint. DIAGNOSIS Diagnosis of this condition can often be made by history and exam. Sometimes your caregiver may need X-rays or an MRI scan to determine the exact cause. It may be necessary to see your dentist to determine if your teeth and jaws are lined up correctly. TREATMENT  Most of the time this problem is not serious; however, sometimes it can persist (become chronic). When this happens medications that will cut down on inflammation (soreness) help. Sometimes a shot of cortisone into the joint will be helpful. If your teeth are not aligned it may help for your dentist to make a splint for your mouth that can help this problem. If no physical problems can be found, the problem may come from tension. If tension is found to be the cause, biofeedback or relaxation techniques may be helpful. HOME CARE INSTRUCTIONS   Later in the day, applications of ice packs may be helpful. Ice can be used in a plastic bag with a towel around it to prevent frostbite to skin. This may be used about every 2 hours for 20 to 30 minutes, as needed while awake, or as directed by your caregiver.  Only take over-the-counter or prescription medicines for pain, discomfort, or fever as directed by your caregiver.  If physical therapy was  prescribed, follow your caregiver's directions.  Wear mouth appliances as directed if they were given. Document Released: 04/19/2001 Document Revised: 10/17/2011 Document Reviewed: 07/27/2008 [facility] Patient Information 2015 [place], [place]. This information is not intended to replace advice given to you by your health care provider. Make sure you discuss any questions you have with your health care provider. Lumbosacral Strain Lumbosacral strain is a strain of any of the parts that make up your lumbosacral vertebrae. Your lumbosacral vertebrae are the bones that make up the lower third of your backbone. Your lumbosacral vertebrae are held together by muscles and tough, fibrous tissue (ligaments).  CAUSES  A sudden blow to your back can cause lumbosacral strain. Also, anything that causes an excessive stretch of the muscles in the low back can cause this strain. This is typically seen when people exert themselves strenuously, fall, lift heavy objects, bend, or crouch repeatedly. RISK FACTORS  Physically demanding work.  Participation in pushing or pulling sports or sports that require a sudden twist of the back (tennis, golf, baseball).  Weight lifting.  Excessive lower back curvature.  Forward-tilted pelvis.  Weak back or abdominal muscles or both.  Tight hamstrings. SIGNS AND SYMPTOMS  Lumbosacral strain may cause pain in the area of your injury or pain that moves (radiates) down your leg.  DIAGNOSIS Your health care provider can often diagnose lumbosacral strain through a physical exam. In some cases, you may need tests such as X-ray exams.  TREATMENT  Treatment for  your lower back injury depends on many factors that your clinician will have to evaluate. However, most treatment will include the use of anti-inflammatory medicines. HOME CARE INSTRUCTIONS   Avoid hard physical activities (tennis, racquetball, waterskiing) if you are not in proper physical condition for it. This may  aggravate or create problems.  If you have a back problem, avoid sports requiring sudden body movements. Swimming and walking are generally safer activities.  Maintain good posture.  Maintain a healthy weight.  For acute conditions, you may put ice on the injured area.  Put ice in a plastic bag.  Place a towel between your skin and the bag.  Leave the ice on for 20 minutes, 2-3 times a day.  When the low back starts healing, stretching and strengthening exercises may be recommended. SEEK MEDICAL CARE IF:  Your back pain is getting worse.  You experience severe back pain not relieved with medicines. SEEK IMMEDIATE MEDICAL CARE IF:   You have numbness, tingling, weakness, or problems with the use of your arms or legs.  There is a change in bowel or bladder control.  You have increasing pain in any area of the body, including your belly (abdomen).  You notice shortness of breath, dizziness, or feel faint.  You feel sick to your stomach (nauseous), are throwing up (vomiting), or become sweaty.  You notice discoloration of your toes or legs, or your feet get very cold. MAKE SURE YOU:   Understand these instructions.  Will watch your condition.  Will get help right away if you are not doing well or get worse. Document Released: 05/04/2005 Document Revised: 07/30/2013 Document Reviewed: 03/13/2013 [facility] Patient Information 2015 [place], [place]. This information is not intended to replace advice given to you by your health care provider. Make sure you discuss any questions you have with your health care provider. Vertigo Vertigo means you feel like you or your surroundings are moving when they are not. Vertigo can be dangerous if it occurs when you are at work, driving, or performing difficult activities.  CAUSES  Vertigo occurs when there is a conflict of signals sent to your brain from the visual and sensory systems in your body. There are many different causes of vertigo,  including:  Infections, especially in the inner ear.  A bad reaction to a drug or misuse of alcohol and medicines.  Withdrawal from drugs or alcohol.  Rapidly changing positions, such as lying down or rolling over in bed.  A migraine headache.  Decreased blood flow to the brain.  Increased pressure in the brain from a head injury, infection, tumor, or bleeding. SYMPTOMS  You may feel as though the world is spinning around or you are falling to the ground. Because your balance is upset, vertigo can cause nausea and vomiting. You may have involuntary eye movements (nystagmus). DIAGNOSIS  Vertigo is usually diagnosed by physical exam. If the cause of your vertigo is unknown, your caregiver may perform imaging tests, such as an MRI scan (magnetic resonance imaging). TREATMENT  Most cases of vertigo resolve on their own, without treatment. Depending on the cause, your caregiver may prescribe certain medicines. If your vertigo is related to body position issues, your caregiver may recommend movements or procedures to correct the problem. In rare cases, if your vertigo is caused by certain inner ear problems, you may need surgery. HOME CARE INSTRUCTIONS   Follow your caregiver's instructions.  Avoid driving.  Avoid operating heavy machinery.  Avoid performing any tasks that would  be dangerous to you or others during a vertigo episode.  Tell your caregiver if you notice that certain medicines seem to be causing your vertigo. Some of the medicines used to treat vertigo episodes can actually make them worse in some people. SEEK IMMEDIATE MEDICAL CARE IF:   Your medicines do not relieve your vertigo or are making it worse.  You develop problems with talking, walking, weakness, or using your arms, hands, or legs.  You develop severe headaches.  Your nausea or vomiting continues or gets worse.  You develop visual changes.  A family member notices behavioral changes.  Your condition  gets worse. MAKE SURE YOU:  Understand these instructions.  Will watch your condition.  Will get help right away if you are not doing well or get worse. Document Released: 05/04/2005 Document Revised: 10/17/2011 Document Reviewed: 02/10/2011 [facility] Patient Information 2015 [place], [place]. This information is not intended to replace advice given to you by your health care provider. Make sure you discuss any questions you have with your health care provider.

## 2014-05-26 NOTE — Progress Notes (Signed)
Subjective:    Patient ID: Jennifer [name], female    DOB: 12-15-1965, 48 y.o.   MRN: [id number] This chart was scribed for Arlyss Queen, MD by Marti Sleigh, Medical Scribe. This patient was seen in Room 8 and the patient's care was started at 2:05 PM.  HPI HPI Comments: Jennifer [name] is a 48 y.o. female who presents to [facility] complaining of radiating moderate jaw pain that started three days ago. Pt states that she got up Friday morning and had moderate left-sided jaw pain that radiates to her left ear. Pt endorses associated vertigo. Pt denies hearing loss. Pt has been compliant with her paxil medication. Pt took ibuprofen yesterday for her back and jaw discomfort. Pt does not use a mouth guard when she sleeps at night. Pt states she would like a whooping cough shot. Pt smokes 1/2 PPD for the past 35 years. Pt does not drink.   Pt is also complaining of lower back pain that began two days ago. She states that she was carrying a heavy load and after she set it down she had mild lower back pain that has been ongoing since her injury.   Review of Systems  Constitutional: Positive for activity change.  HENT: Positive for ear pain. Negative for hearing loss.        Jaw pain  Musculoskeletal: Positive for back pain.  Neurological: Positive for dizziness.       Objective:   Physical Exam  Nursing note and vitals reviewed. Constitutional: She is oriented to person, place, and time. She appears well-developed and well-nourished.  HENT:  Head: Normocephalic and atraumatic.  Very tender at left TMJ. She has pain when she opens mouth. TM .  Eyes: Pupils are equal, round, and reactive to light.  Neck: No JVD present.  Cardiovascular: Normal rate and regular rhythm.   Pulmonary/Chest: Effort normal and breath sounds normal. No respiratory distress.  Abdominal: Soft.  Musculoskeletal:  Tender over lower lumbar spine, especially at right SI joint. Straight leg raising is negative to 90  degrees.  Neurological: She is alert and oriented to person, place, and time. She has normal strength and normal reflexes.  Skin: Skin is warm and dry.  Psychiatric: She has a normal mood and affect. Her behavior is normal.   Results for orders placed in visit on 05/26/14  POCT CBC      Result Value Ref Range   WBC 13.8 (*) 4.6 - 10.2 K/uL   Lymph, poc 5.8 (*) 0.6 - 3.4   POC LYMPH PERCENT 42.3  10 - 50 %L   MID (cbc) 0.3  0 - 0.9   POC MID % 2.4  0 - 12 %M   POC Granulocyte 7.6 (*) 2 - 6.9   Granulocyte percent 55.3  37 - 80 %G   RBC 5.70 (*) 4.04 - 5.48 M/uL   Hemoglobin 16.4 (*) 12.2 - 16.2 g/dL   HCT, POC 50.1 (*) 37.7 - 47.9 %   MCV 87.9  80 - 97 fL   MCH, POC 28.8  27 - 31.2 pg   MCHC 32.8  31.8 - 35.4 g/dL   RDW, POC 13.8     Platelet Count, POC 307  142 - 424 K/uL   MPV 7.1  0 - 99.8 fL  GLUCOSE, POCT (MANUAL RESULT ENTRY)      Result Value Ref Range   POC Glucose 81  70 - 99 mg/dl      Assessment & Plan:  We'll add a baby aspirin one a day. She has an elevated hemoglobin most likely secondary to her smoking. Will place her on Flexeril 5 mg at bedtime along with meloxicam as an anti-inflammatory for her jaw and back pain.I personally performed the services described in this documentation, which was scribed in my presence. The recorded information has been reviewed and is accurate.

## 2014-05-27 LAB — HEPATITIS B SURFACE ANTIGEN: HEP B S AG: NEGATIVE

## 2014-05-27 LAB — HEPATITIS C ANTIBODY: HCV AB: NEGATIVE

## 2014-05-27 LAB — PATHOLOGIST SMEAR REVIEW

## 2014-06-04 ENCOUNTER — Other Ambulatory Visit: Payer: Self-pay | Admitting: *Deleted

## 2014-06-11 ENCOUNTER — Other Ambulatory Visit (INDEPENDENT_AMBULATORY_CARE_PROVIDER_SITE_OTHER): Payer: Managed Care, Other (non HMO) | Admitting: *Deleted

## 2014-06-11 DIAGNOSIS — D72829 Elevated white blood cell count, unspecified: Secondary | ICD-10-CM

## 2014-06-11 NOTE — Progress Notes (Signed)
Pt here for lab draw only  

## 2014-06-12 LAB — IMMUNOPHENOTYPING BY FLOW CYTOMETRY

## 2014-06-12 LAB — REFLEX BLAST SCREEN

## 2014-09-15 ENCOUNTER — Other Ambulatory Visit: Payer: Self-pay | Admitting: Obstetrics and Gynecology

## 2014-09-16 LAB — CYTOLOGY - PAP

## 2014-09-24 ENCOUNTER — Ambulatory Visit (INDEPENDENT_AMBULATORY_CARE_PROVIDER_SITE_OTHER): Payer: Managed Care, Other (non HMO) | Admitting: Internal Medicine

## 2014-09-24 ENCOUNTER — Encounter: Payer: Self-pay | Admitting: Internal Medicine

## 2014-09-24 VITALS — BP 144/90 | HR 89 | Temp 98.2°F | Resp 16 | Ht 67.0 in | Wt 212.0 lb

## 2014-09-24 DIAGNOSIS — K120 Recurrent oral aphthae: Secondary | ICD-10-CM

## 2014-09-24 DIAGNOSIS — F418 Other specified anxiety disorders: Secondary | ICD-10-CM

## 2014-09-24 DIAGNOSIS — Z889 Allergy status to unspecified drugs, medicaments and biological substances status: Secondary | ICD-10-CM

## 2014-09-24 DIAGNOSIS — T7840XA Allergy, unspecified, initial encounter: Secondary | ICD-10-CM

## 2014-09-24 DIAGNOSIS — F5105 Insomnia due to other mental disorder: Secondary | ICD-10-CM

## 2014-09-24 DIAGNOSIS — F419 Anxiety disorder, unspecified: Secondary | ICD-10-CM

## 2014-09-24 MED ORDER — PAROXETINE HCL 40 MG PO TABS: 40.0000 mg | ORAL_TABLET | Freq: Every day | ORAL | Status: DC

## 2014-09-24 MED ORDER — ALPRAZOLAM 0.5 MG PO TABS: 0.5000 mg | ORAL_TABLET | Freq: Three times a day (TID) | ORAL | Status: DC | PRN

## 2014-09-24 MED ORDER — LIDOCAINE VISCOUS 2 % MT SOLN: 20.0000 mL | OROMUCOSAL | Status: DC | PRN
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
## 2014-09-24 NOTE — Patient Instructions (Signed)
Jennifer [name]  [name]

## 2014-10-06 ENCOUNTER — Ambulatory Visit (INDEPENDENT_AMBULATORY_CARE_PROVIDER_SITE_OTHER): Payer: 59 | Admitting: Licensed Clinical Social Worker

## 2014-10-06 DIAGNOSIS — F332 Major depressive disorder, recurrent severe without psychotic features: Secondary | ICD-10-CM

## 2014-10-15 ENCOUNTER — Encounter: Payer: Self-pay | Admitting: Internal Medicine

## 2014-10-15 ENCOUNTER — Ambulatory Visit (INDEPENDENT_AMBULATORY_CARE_PROVIDER_SITE_OTHER): Payer: Managed Care, Other (non HMO) | Admitting: Internal Medicine

## 2014-10-15 VITALS — BP 120/80 | HR 74 | Temp 98.4°F | Resp 16 | Ht 66.5 in | Wt 215.2 lb

## 2014-10-15 DIAGNOSIS — F418 Other specified anxiety disorders: Secondary | ICD-10-CM | POA: Diagnosis not present

## 2014-10-15 DIAGNOSIS — N390 Urinary tract infection, site not specified: Secondary | ICD-10-CM

## 2014-10-15 HISTORY — DX: Other specified anxiety disorders: F41.8

## 2014-10-15 LAB — POCT UA - MICROSCOPIC ONLY
BACTERIA, U MICROSCOPIC: NEGATIVE
CASTS, UR, LPF, POC: NEGATIVE
Mucus, UA: POSITIVE
RBC, URINE, MICROSCOPIC: NEGATIVE
Yeast, UA: NEGATIVE

## 2014-10-15 LAB — POCT URINALYSIS DIPSTICK
Bilirubin, UA: NEGATIVE
Glucose, UA: NEGATIVE
Ketones, UA: NEGATIVE
Leukocytes, UA: NEGATIVE
Nitrite, UA: NEGATIVE
PH UA: 5.5
PROTEIN UA: NEGATIVE
RBC UA: NEGATIVE
SPEC GRAV UA: 1.025
UROBILINOGEN UA: 0.2

## 2014-10-15 MED ORDER — ALPRAZOLAM 0.5 MG PO TABS: 0.5000 mg | ORAL_TABLET | Freq: Three times a day (TID) | ORAL | Status: DC | PRN

## 2014-10-15 NOTE — Progress Notes (Signed)
   Subjective:    Patient ID: [name], female    DOB: 06-17-1966, 49 y.o.   MRN: [id number]  HPIbetter Meds are working She has started counseling with Marya Amsler Sleep much better Anxiety attacks controlled by using medication when she comes home from work  Also needs follow-up for urinary tract issues Medications were discontinued due to allergic rash She is now asymptomatic but would like a follow-up urinalysis  Review of Systems Noncontributory     Objective:   Physical Exam BP 120/80 mmHg  Pulse 74  Temp(Src) 98.4 F (36.9 C) (Oral)  Resp 16  Ht 5' 6.5" (1.689 m)  Wt 215 lb 3.2 oz (97.614 kg)  BMI 34.22 kg/m2  SpO2 97% No acute distress Exam normal  Results for orders placed or performed in visit on 10/15/14  POCT UA - Microscopic Only  Result Value Ref Range   WBC, Ur, HPF, POC 0-2    RBC, urine, microscopic neg    Bacteria, U Microscopic neg    Mucus, UA positive    Epithelial cells, urine per micros 2-4    Crystals, Ur, HPF, POC calcium oxalate    Casts, Ur, LPF, POC neg    Yeast, UA neg   POCT urinalysis dipstick  Result Value Ref Range   Color, UA yellow    Clarity, UA clear    Glucose, UA neg    Bilirubin, UA neg    Ketones, UA neg    Spec Grav, UA 1.025    Blood, UA neg    pH, UA 5.5    Protein, UA neg    Urobilinogen, UA 0.2    Nitrite, UA neg    Leukocytes, UA Negative          Assessment & Plan:  UTI (lower urinary tract infection)--resolved without further treatment Depression with anxiety with situational exacerbation  Meds ordered this encounter  Medications  . ALPRAZolam (XANAX) 0.5 MG tablet    Sig: Take 1 tablet (0.5 mg total) by mouth 3 (three) times daily as needed for anxiety or sleep.    Dispense:  90 tablet    Refill:  5   Continue Paxil 40 mg Continue counseling Follow-up 36 months as needed

## 2014-10-20 ENCOUNTER — Ambulatory Visit (INDEPENDENT_AMBULATORY_CARE_PROVIDER_SITE_OTHER): Payer: 59 | Admitting: Licensed Clinical Social Worker

## 2014-10-20 DIAGNOSIS — F332 Major depressive disorder, recurrent severe without psychotic features: Secondary | ICD-10-CM | POA: Diagnosis not present

## 2014-11-03 ENCOUNTER — Ambulatory Visit (INDEPENDENT_AMBULATORY_CARE_PROVIDER_SITE_OTHER): Payer: 59 | Admitting: Licensed Clinical Social Worker

## 2014-11-03 ENCOUNTER — Ambulatory Visit: Payer: 59 | Admitting: Licensed Clinical Social Worker

## 2014-11-03 DIAGNOSIS — F332 Major depressive disorder, recurrent severe without psychotic features: Secondary | ICD-10-CM | POA: Diagnosis not present

## 2014-11-24 ENCOUNTER — Ambulatory Visit (INDEPENDENT_AMBULATORY_CARE_PROVIDER_SITE_OTHER): Payer: 59 | Admitting: Licensed Clinical Social Worker

## 2014-11-24 DIAGNOSIS — F332 Major depressive disorder, recurrent severe without psychotic features: Secondary | ICD-10-CM

## 2014-12-17 ENCOUNTER — Ambulatory Visit (INDEPENDENT_AMBULATORY_CARE_PROVIDER_SITE_OTHER): Payer: 59 | Admitting: Licensed Clinical Social Worker

## 2014-12-17 DIAGNOSIS — F332 Major depressive disorder, recurrent severe without psychotic features: Secondary | ICD-10-CM

## 2015-01-28 ENCOUNTER — Ambulatory Visit (INDEPENDENT_AMBULATORY_CARE_PROVIDER_SITE_OTHER): Payer: 59 | Admitting: Licensed Clinical Social Worker

## 2015-01-28 DIAGNOSIS — F332 Major depressive disorder, recurrent severe without psychotic features: Secondary | ICD-10-CM

## 2015-04-15 ENCOUNTER — Ambulatory Visit (INDEPENDENT_AMBULATORY_CARE_PROVIDER_SITE_OTHER): Payer: Managed Care, Other (non HMO) | Admitting: Internal Medicine

## 2015-04-15 VITALS — BP 119/78 | HR 83 | Temp 98.3°F | Resp 16 | Ht 67.0 in | Wt 217.0 lb

## 2015-04-15 DIAGNOSIS — F4323 Adjustment disorder with mixed anxiety and depressed mood: Secondary | ICD-10-CM | POA: Diagnosis not present

## 2015-04-15 DIAGNOSIS — R5383 Other fatigue: Secondary | ICD-10-CM | POA: Diagnosis not present

## 2015-04-15 DIAGNOSIS — Z114 Encounter for screening for human immunodeficiency virus [HIV]: Secondary | ICD-10-CM

## 2015-04-15 LAB — COMPREHENSIVE METABOLIC PANEL
ALT: 29 U/L (ref 6–29)
AST: 26 U/L (ref 10–35)
Albumin: 4.1 g/dL (ref 3.6–5.1)
Alkaline Phosphatase: 71 U/L (ref 33–130)
BUN: 13 mg/dL (ref 7–25)
CALCIUM: 9.1 mg/dL (ref 8.6–10.4)
CO2: 26 mmol/L (ref 20–31)
Chloride: 104 mmol/L (ref 98–110)
Creat: 0.61 mg/dL (ref 0.50–1.05)
Glucose, Bld: 100 mg/dL — ABNORMAL HIGH (ref 65–99)
POTASSIUM: 3.4 mmol/L — AB (ref 3.5–5.3)
Sodium: 144 mmol/L (ref 135–146)
TOTAL PROTEIN: 6.5 g/dL (ref 6.1–8.1)
Total Bilirubin: 0.3 mg/dL (ref 0.2–1.2)

## 2015-04-15 LAB — CBC
HCT: 44.3 % (ref 36.0–46.0)
HEMOGLOBIN: 15.2 g/dL — AB (ref 12.0–15.0)
MCH: 29 pg (ref 26.0–34.0)
MCHC: 34.3 g/dL (ref 30.0–36.0)
MCV: 84.5 fL (ref 78.0–100.0)
MPV: 9.6 fL (ref 8.6–12.4)
Platelets: 317 10*3/uL (ref 150–400)
RBC: 5.24 MIL/uL — AB (ref 3.87–5.11)
RDW: 13.9 % (ref 11.5–15.5)
WBC: 12.4 10*3/uL — ABNORMAL HIGH (ref 4.0–10.5)

## 2015-04-15 LAB — TSH: TSH: 0.897 u[IU]/mL (ref 0.350–4.500)

## 2015-04-15 MED ORDER — ALPRAZOLAM 0.5 MG PO TABS: 0.5000 mg | ORAL_TABLET | Freq: Three times a day (TID) | ORAL | Status: DC | PRN
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
## 2015-04-16 LAB — HIV ANTIBODY (ROUTINE TESTING W REFLEX): HIV 1&2 Ab, 4th Generation: NONREACTIVE

## 2015-04-20 ENCOUNTER — Telehealth: Payer: Self-pay | Admitting: Family Medicine

## 2015-04-20 NOTE — Telephone Encounter (Signed)
Patient returned call about her lab results. Gave her the following message from Tor Netters, FNP Notes Recorded by Elby Beck, FNP on 04/18/2015 at 7:45 AM "Please call patient and let her know that her HIV test was negative and her blood counts, kidney function, electrolytes, thyroid and liver function tests look good.".... Patient understood and was happy.   [phone]

## 2015-06-12 ENCOUNTER — Other Ambulatory Visit: Payer: Self-pay | Admitting: Internal Medicine

## 2015-07-01 ENCOUNTER — Other Ambulatory Visit: Payer: Self-pay | Admitting: Family Medicine

## 2015-07-03 NOTE — Telephone Encounter (Signed)
Rx faxed

## 2015-07-06 ENCOUNTER — Encounter: Payer: Self-pay | Admitting: Internal Medicine

## 2015-08-06 ENCOUNTER — Other Ambulatory Visit: Payer: Self-pay | Admitting: Family Medicine

## 2015-08-08 NOTE — Telephone Encounter (Signed)
It looks like Dr. Laney Pastor gave the patient a 30 day supply with a refill on 07/03/15, so she would not be due until 09/02/15. Can you double check this please?

## 2015-09-16 ENCOUNTER — Encounter: Payer: Self-pay | Admitting: Internal Medicine

## 2015-09-16 ENCOUNTER — Ambulatory Visit (INDEPENDENT_AMBULATORY_CARE_PROVIDER_SITE_OTHER): Payer: Managed Care, Other (non HMO) | Admitting: Internal Medicine

## 2015-09-16 VITALS — BP 150/84 | HR 89 | Temp 98.0°F | Resp 16 | Wt 218.0 lb

## 2015-09-16 DIAGNOSIS — F418 Other specified anxiety disorders: Secondary | ICD-10-CM | POA: Diagnosis not present

## 2015-09-16 DIAGNOSIS — F5105 Insomnia due to other mental disorder: Secondary | ICD-10-CM | POA: Diagnosis not present

## 2015-09-16 DIAGNOSIS — F419 Anxiety disorder, unspecified: Secondary | ICD-10-CM | POA: Diagnosis not present

## 2015-09-16 DIAGNOSIS — F4323 Adjustment disorder with mixed anxiety and depressed mood: Secondary | ICD-10-CM

## 2015-09-16 MED ORDER — ALPRAZOLAM 0.5 MG PO TABS: ORAL_TABLET | ORAL | Status: DC

## 2015-09-16 MED ORDER — PAROXETINE HCL 40 MG PO TABS: ORAL_TABLET | ORAL | Status: DC

## 2015-09-16 NOTE — Patient Instructions (Signed)
Tor Netters FNP for followup
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
## 2015-10-28 ENCOUNTER — Ambulatory Visit: Payer: Managed Care, Other (non HMO) | Admitting: Internal Medicine

## 2015-11-28 ENCOUNTER — Ambulatory Visit (INDEPENDENT_AMBULATORY_CARE_PROVIDER_SITE_OTHER): Payer: Managed Care, Other (non HMO) | Admitting: Internal Medicine

## 2015-11-28 VITALS — BP 110/68 | HR 79 | Temp 97.9°F | Resp 16 | Ht 67.0 in | Wt 210.0 lb

## 2015-11-28 DIAGNOSIS — R05 Cough: Secondary | ICD-10-CM | POA: Diagnosis not present

## 2015-11-28 DIAGNOSIS — R059 Cough, unspecified: Secondary | ICD-10-CM

## 2015-11-28 DIAGNOSIS — J301 Allergic rhinitis due to pollen: Secondary | ICD-10-CM | POA: Diagnosis not present

## 2015-11-28 MED ORDER — HYDROCODONE-HOMATROPINE 5-1.5 MG/5ML PO SYRP: 5.0000 mL | ORAL_SOLUTION | Freq: Four times a day (QID) | ORAL | Status: DC | PRN

## 2015-11-28 MED ORDER — PREDNISONE 20 MG PO TABS: ORAL_TABLET | ORAL | Status: DC

## 2015-11-28 NOTE — Progress Notes (Signed)
Subjective:  By signing my name below, I, Jennifer [name], attest that this documentation has been prepared under the direction and in the presence of Jennifer Lin, MD.  Electronically Signed: Thea [name], ED Scribe. 11/28/2015. 9:53 AM.   Patient ID: Jennifer [name], female    DOB: [birth year], 50 y.o.   MRN: [id number]  HPI   Chief Complaint  Patient presents with  . Cough    x 4 weeks, nonproductive   . Ear Fullness   HPI Comments: Jennifer [name] is a 50 y.o. female who presents to the [facility] complaining of cough and ear fullness. Pt believes symptoms area due to allergies, stating she has been outside doing yard work.Pt states symptoms started with ithcy eyes and throat, followed by cough and mild rhinorrhea. Cough keeps her awakes at night. She denies wheezing, sneezing ans SOB.   Pt still takes paxil and xanax twice a day. She feels this dose works well for her. Recent death of her estranged husband is still in the process of being resolved  Patient Active Problem List   Diagnosis Date Noted  . Depression with anxiety 10/15/2014  . Personal history of adenomatous colonic polyps 05/08/2012  . De Quervain's tenosynovitis 04/30/2012  . MENORRHAGIA 03/05/2008  . SEBORRHEIC KERATOSIS, INFLAMED 11/07/2007  . LOW HDL 02/28/2007  . OBESITY 02/28/2007  . SMOKER 02/21/2007  . DEPRESSION 02/21/2007  . ECZEMA 02/21/2007  . ROSACEA 02/21/2007   Past Medical History  Diagnosis Date  . Depression   . kidney stone   . Tenosynovitis, de Quervain   . Rosacea   . Fundic gland polyps of stomach, benign   . Low HDL (under 40)   . Urosepsis 10/2011    after stent  . Cancer ([place])     basal cell  . Personal history of adenomatous colonic polyps 05/08/2012  . Diverticulosis   . Allergy    Past Surgical History  Procedure Laterality Date  . Tubal ligation    . Oophorectomy      left ovary  . Vaginal hysterectomy    . Umbilical hernia repair    . Wisdom  tooth extraction    . Cystoscopy w/ ureteral stent placement  10/14/2011    Procedure: CYSTOSCOPY WITH RETROGRADE PYELOGRAM/URETERAL STENT PLACEMENT;  Surgeon: Hanley Ben, MD;  Location: [facility];  Service: Urology;  Laterality: Right;  Right Double J Stent  . Upper gastrointestinal endoscopy  2008  . Cervical biopsy  w/ loop electrode excision  04/2003   Allergies  Allergen Reactions  . Nitrofurantoin Monohyd Macro Other (See Comments)    Body aches, nausea and vomiting  . Sulfa Antibiotics     Skin rash, nausea, fever, disoriented   Prior to Admission medications   Medication Sig Start Date End Date Taking? Authorizing Provider  ALPRAZolam (XANAX) 0.5 MG tablet TAKE 1 TABLET (0.5MG  TOTAL) BY MOUTH THREE TIMES DAILY AS NEEDED FOR ANXIETY OR SLEEP 09/16/15  Yes Leandrew Koyanagi, MD  ibuprofen (ADVIL,MOTRIN) 200 MG tablet Take 200 mg by mouth daily.   Yes Historical Provider, MD  PARoxetine (PAXIL) 40 MG tablet TAKE 1 TABLET (40 MG TOTAL) BY MOUTH DAILY. 09/16/15  Yes Leandrew Koyanagi, MD  promethazine (PHENERGAN) 25 MG tablet Take 25 mg by mouth every 6 (six) hours as needed. Reported on 09/16/2015   Yes Historical Provider, MD  lidocaine (XYLOCAINE) 2 % solution Use as directed 20 mLs in the mouth or throat as needed for mouth pain.  Patient not taking: Reported on 10/15/2014 09/24/14   Leandrew Koyanagi, MD   Review of Systems  Constitutional: Negative for fever and chills.  HENT: Positive for congestion and rhinorrhea. Negative for sneezing and sore throat.   Respiratory: Positive for cough. Negative for shortness of breath and wheezing.     Objective:   Physical Exam  Constitutional: She is oriented to person, place, and time. She appears well-developed and well-nourished. No distress.  HENT:  Head: Normocephalic and atraumatic.  Right Ear: Tympanic membrane and ear canal normal.  Left Ear: Tympanic membrane and ear canal normal.  Mouth/Throat: Oropharynx is clear and moist and  mucous membranes are normal.  Boggy turbs-glistening  Eyes: EOM are normal. Pupils are equal, round, and reactive to light.   Injected conjunct  Neck: Neck supple.  Cardiovascular: Normal rate.   Pulmonary/Chest: Effort normal and breath sounds normal. She has no wheezes. She has no rales.  Musculoskeletal: Normal range of motion.  Neurological: She is alert and oriented to person, place, and time.  Skin: Skin is warm and dry.  Psychiatric: She has a normal mood and affect. Her behavior is normal.  Nursing note and vitals reviewed.  Filed Vitals:   11/28/15 0850  BP: 110/68  Pulse: 79  Temp: 97.9 F (36.6 C)  TempSrc: Oral  Resp: 16  Height: 5\' 7"  (1.702 m)  Weight: 210 lb (95.255 kg)  SpO2: 96%    Assessment & Plan:  Allergic rhinitis due to pollen  Cough    Meds ordered this encounter  Medications  . predniSONE (DELTASONE) 20 MG tablet    Sig: 3/3/2/2/1/1 single daily dose for 6 days    Dispense:  12 tablet    Refill:  0  . HYDROcodone-homatropine (HYCODAN) 5-1.5 MG/5ML syrup    Sig: Take 5 mLs by mouth every 6 (six) hours as needed.    Dispense:  120 mL    Refill:  0    I have completed the patient encounter in its entirety as documented by the scribe, with editing by me where necessary. Jennifer [name] Jennifer [name], M.D.

## 2015-11-28 NOTE — Patient Instructions (Signed)
     IF you received an x-ray today, you will receive an invoice from [facility]. Please contact [facility] at [phone] with questions or concerns regarding your invoice.   IF you received labwork today, you will receive an invoice from [facility]. Please contact [facility] at [phone] with questions or concerns regarding your invoice.   Our billing staff will not be able to assist you with questions regarding bills from these companies.  You will be contacted with the lab results as soon as they are available. The fastest way to get your results is to activate your My Chart account. Instructions are located on the last page of this paperwork. If you have not heard from us regarding the results in 2 weeks, please contact this office.      

## 2015-12-15 ENCOUNTER — Ambulatory Visit (INDEPENDENT_AMBULATORY_CARE_PROVIDER_SITE_OTHER): Payer: Commercial Indemnity | Admitting: Licensed Clinical Social Worker

## 2015-12-15 DIAGNOSIS — F3341 Major depressive disorder, recurrent, in partial remission: Secondary | ICD-10-CM

## 2016-01-05 ENCOUNTER — Ambulatory Visit (INDEPENDENT_AMBULATORY_CARE_PROVIDER_SITE_OTHER): Payer: Commercial Indemnity | Admitting: Licensed Clinical Social Worker

## 2016-01-05 DIAGNOSIS — F3341 Major depressive disorder, recurrent, in partial remission: Secondary | ICD-10-CM

## 2016-01-18 ENCOUNTER — Ambulatory Visit (INDEPENDENT_AMBULATORY_CARE_PROVIDER_SITE_OTHER): Payer: Managed Care, Other (non HMO) | Admitting: Emergency Medicine

## 2016-01-18 VITALS — BP 118/80 | HR 76 | Temp 97.8°F | Resp 18 | Ht 67.0 in | Wt 194.2 lb

## 2016-01-18 DIAGNOSIS — F329 Major depressive disorder, single episode, unspecified: Secondary | ICD-10-CM

## 2016-01-18 DIAGNOSIS — F32A Depression, unspecified: Secondary | ICD-10-CM

## 2016-01-18 LAB — POCT CBC
Granulocyte percent: 66.9 %G (ref 37–80)
HEMATOCRIT: 45.3 % (ref 37.7–47.9)
Hemoglobin: 16 g/dL (ref 12.2–16.2)
LYMPH, POC: 3.1 (ref 0.6–3.4)
MCH, POC: 28.5 pg (ref 27–31.2)
MCHC: 35.4 g/dL (ref 31.8–35.4)
MCV: 80.3 fL (ref 80–97)
MID (CBC): 0.9 (ref 0–0.9)
MPV: 7.3 fL (ref 0–99.8)
POC GRANULOCYTE: 8 — AB (ref 2–6.9)
POC LYMPH %: 25.8 % (ref 10–50)
POC MID %: 7.3 %M (ref 0–12)
Platelet Count, POC: 254 10*3/uL (ref 142–424)
RBC: 5.64 M/uL — AB (ref 4.04–5.48)
RDW, POC: 14.3 %
WBC: 11.9 10*3/uL — AB (ref 4.6–10.2)

## 2016-01-18 LAB — TSH: TSH: 1.08 m[IU]/L

## 2016-01-18 MED ORDER — PAROXETINE HCL 40 MG PO TABS: ORAL_TABLET | ORAL | Status: DC

## 2016-01-18 MED ORDER — ALPRAZOLAM 0.5 MG PO TABS: ORAL_TABLET | ORAL | Status: DC

## 2016-01-18 MED ORDER — TRAZODONE HCL 50 MG PO TABS: 25.0000 mg | ORAL_TABLET | Freq: Every evening | ORAL | Status: DC | PRN

## 2016-01-18 NOTE — Patient Instructions (Signed)
IF you received an x-ray today, you will receive an invoice from [facility]. Please contact [facility] at [phone] with questions or concerns regarding your invoice.   IF you received labwork today, you will receive an invoice from [facility]. Please contact [facility] at [phone] with questions or concerns regarding your invoice.   Our billing staff will not be able to assist you with questions regarding bills from these companies.  You will be contacted with the lab results as soon as they are available. The fastest way to get your results is to activate your My Chart account. Instructions are located on the last page of this paperwork. If you have not heard from [place] regarding the results in 2 weeks, please contact this office.           Major Depressive Disorder Major depressive disorder is a mental illness. It also may be called clinical depression or unipolar depression. Major depressive disorder usually causes feelings of sadness, hopelessness, or helplessness. Some people with this disorder do not feel particularly sad but lose interest in doing things they used to enjoy (anhedonia). Major depressive disorder also can cause physical symptoms. It can interfere with work, school, relationships, and other normal everyday activities. The disorder varies in severity but is longer lasting and more serious than the sadness we all feel from time to time in our lives. Major depressive disorder often is triggered by stressful life events or major life changes. Examples of these triggers include divorce, loss of your job or home, a move, and the death of a family member or close friend. Sometimes this disorder occurs for no obvious reason at all. People who have family members with major depressive disorder or bipolar disorder are at higher risk for developing this disorder, with or without life stressors. Major depressive disorder can occur at  any age. It may occur just once in your life (single episode major depressive disorder). It may occur multiple times (recurrent major depressive disorder). SYMPTOMS People with major depressive disorder have either anhedonia or depressed mood on nearly a daily basis for at least 2 weeks or longer. Symptoms of depressed mood include:  Feelings of sadness (blue or down in the dumps) or emptiness.  Feelings of hopelessness or helplessness.  Tearfulness or episodes of crying (may be observed by others).  Irritability (children and adolescents). In addition to depressed mood or anhedonia or both, people with this disorder have at least four of the following symptoms:  Difficulty sleeping or sleeping too much.   Significant change (increase or decrease) in appetite or weight.   Lack of energy or motivation.  Feelings of guilt and worthlessness.   Difficulty concentrating, remembering, or making decisions.  Unusually slow movement (psychomotor retardation) or restlessness (as observed by others).   Recurrent wishes for death, recurrent thoughts of self-harm (suicide), or a suicide attempt. People with major depressive disorder commonly have persistent negative thoughts about themselves, other people, and the world. People with severe major depressive disorder may experiencedistorted beliefs or perceptions about the world (psychotic delusions). They also may see or hear things that are not real (psychotic hallucinations). DIAGNOSIS Major depressive disorder is diagnosed through an assessment by your health care provider. Your health care provider will ask aboutaspects of your daily life, such as mood,sleep, and appetite, to see if you have the diagnostic symptoms of major depressive disorder. Your health care provider may ask about your medical history and use of alcohol or drugs, including  prescription medicines. Your health care provider also may do a physical exam and blood work. This  is because certain medical conditions and the use of certain substances can cause major depressive disorder-like symptoms (secondary depression). Your health care provider also may refer you to a mental health specialist for further evaluation and treatment. TREATMENT It is important to recognize the symptoms of major depressive disorder and seek treatment. The following treatments can be prescribed for this disorder:   Medicine. Antidepressant medicines usually are prescribed. Antidepressant medicines are thought to correct chemical imbalances in the brain that are commonly associated with major depressive disorder. Other types of medicine may be added if the symptoms do not respond to antidepressant medicines alone or if psychotic delusions or hallucinations occur.  Talk therapy. Talk therapy can be helpful in treating major depressive disorder by providing support, education, and guidance. Certain types of talk therapy also can help with negative thinking (cognitive behavioral therapy) and with relationship issues that trigger this disorder (interpersonal therapy). A mental health specialist can help determine which treatment is best for you. Most people with major depressive disorder do well with a combination of medicine and talk therapy. Treatments involving electrical stimulation of the brain can be used in situations with extremely severe symptoms or when medicine and talk therapy do not work over time. These treatments include electroconvulsive therapy, transcranial magnetic stimulation, and vagal nerve stimulation.   This information is not intended to replace advice given to you by your health care provider. Make sure you discuss any questions you have with your health care provider.   Document Released: 11/19/2012 Document Revised: 08/15/2014 Document Reviewed: 11/19/2012 Elsevier Interactive Patient Education [employer].
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
## 2016-01-26 ENCOUNTER — Ambulatory Visit (INDEPENDENT_AMBULATORY_CARE_PROVIDER_SITE_OTHER): Payer: Commercial Indemnity | Admitting: Licensed Clinical Social Worker

## 2016-01-26 DIAGNOSIS — F3341 Major depressive disorder, recurrent, in partial remission: Secondary | ICD-10-CM | POA: Diagnosis not present

## 2016-02-10 ENCOUNTER — Ambulatory Visit (INDEPENDENT_AMBULATORY_CARE_PROVIDER_SITE_OTHER): Payer: Managed Care, Other (non HMO) | Admitting: Licensed Clinical Social Worker

## 2016-02-10 DIAGNOSIS — F3341 Major depressive disorder, recurrent, in partial remission: Secondary | ICD-10-CM

## 2016-02-14 ENCOUNTER — Encounter: Payer: Self-pay | Admitting: Emergency Medicine

## 2016-02-15 ENCOUNTER — Ambulatory Visit (HOSPITAL_COMMUNITY)
Admission: RE | Admit: 2016-02-15 | Discharge: 2016-02-15 | Disposition: A | Discharge status: Home / Self Care | Payer: Commercial Indemnity | Attending: Psychiatry | Admitting: Psychiatry

## 2016-02-15 ENCOUNTER — Ambulatory Visit (INDEPENDENT_AMBULATORY_CARE_PROVIDER_SITE_OTHER): Payer: Managed Care, Other (non HMO) | Admitting: Emergency Medicine

## 2016-02-15 ENCOUNTER — Telehealth: Payer: Self-pay

## 2016-02-15 VITALS — BP 126/80 | HR 73 | Temp 98.4°F | Resp 17 | Ht 67.5 in | Wt 192.0 lb

## 2016-02-15 DIAGNOSIS — K317 Polyp of stomach and duodenum: Secondary | ICD-10-CM | POA: Insufficient documentation

## 2016-02-15 DIAGNOSIS — K5790 Diverticulosis of intestine, part unspecified, without perforation or abscess without bleeding: Secondary | ICD-10-CM | POA: Diagnosis not present

## 2016-02-15 DIAGNOSIS — Z87442 Personal history of urinary calculi: Secondary | ICD-10-CM | POA: Insufficient documentation

## 2016-02-15 DIAGNOSIS — F419 Anxiety disorder, unspecified: Secondary | ICD-10-CM | POA: Diagnosis not present

## 2016-02-15 DIAGNOSIS — F32A Depression, unspecified: Secondary | ICD-10-CM

## 2016-02-15 DIAGNOSIS — Z90710 Acquired absence of both cervix and uterus: Secondary | ICD-10-CM | POA: Insufficient documentation

## 2016-02-15 DIAGNOSIS — F331 Major depressive disorder, recurrent, moderate: Secondary | ICD-10-CM | POA: Insufficient documentation

## 2016-02-15 DIAGNOSIS — Z85828 Personal history of other malignant neoplasm of skin: Secondary | ICD-10-CM | POA: Insufficient documentation

## 2016-02-15 DIAGNOSIS — L719 Rosacea, unspecified: Secondary | ICD-10-CM | POA: Diagnosis not present

## 2016-02-15 DIAGNOSIS — F329 Major depressive disorder, single episode, unspecified: Secondary | ICD-10-CM | POA: Diagnosis not present

## 2016-02-15 NOTE — Telephone Encounter (Signed)
Call patient and tell her I will try and speak with the psychiatrist personally tomorrow to see what we can do as far as medication adjustments.

## 2016-02-15 NOTE — Telephone Encounter (Signed)
Spoke with counselor at [facility] and pt is not severe enough to be admitted but has an appointment on August 14th with a psychiatrist. She was wondering if we could increase her dose so she can get through until then. They have called all around to see if they could get in sooner but no success. Please advise.
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
## 2016-02-15 NOTE — Progress Notes (Signed)
By signing my name below, I, Jennifer [name], attest that this documentation has been prepared under the direction and in the presence of Arlyss Queen, MD.  Electronically Signed: [name], ED Scribe. 02/15/2016. 12:04 PM.  Chief Complaint:  Chief Complaint  Patient presents with  . Depression  . Shaking   HPI:  Jennifer [name] is a 50 y.o. female who reports to [facility] today complaining of depression. Patients husband died in 26-Sep-2022 due to cirrhosis and states that he was self medicating with alcohol and marijuana. Pt sent an e-mail last night requesting an increase in xanax. She has been seeing her therapist Almyra Free every 2-3 weeks, last appointment was 1 week ago. Pt feels very comfortable talking with her. She has an appointment with psychiatrist next month, August 14. Pt states she has been crying daily anytime, anywhere. She states she constantly feels bad all the time and is constantly shaking. She attended a Wedding this past weekend but had to leave early due to shaking and not being able to breath and suspects this was an anxiety. Pt has had similar episodes in the past but was able to control it over the years. She takes a quarter of trazodone for sleep.  Pt works at [employer]. She has been working, but cries at work as well. She does not want to do inpatient care. She reports thoughts of feels as if she does not belong. She does not want to talk about with what or how she would harm herself. She is unsure what her place is but knows her parents would be upset. Her parents and sister are very supportive.  She also has 2 dogs that she cares for.   Past Medical History  Diagnosis Date  . Depression   . kidney stone   . Tenosynovitis, de Quervain   . Rosacea   . Fundic gland polyps of stomach, benign   . Low HDL (under 40)   . Urosepsis 10/2011    after stent  . Cancer ([place])     basal cell  . Personal history of adenomatous colonic polyps 05/08/2012  . Diverticulosis   .  Allergy    Past Surgical History  Procedure Laterality Date  . Tubal ligation    . Oophorectomy      left ovary  . Vaginal hysterectomy    . Umbilical hernia repair    . Wisdom tooth extraction    . Cystoscopy w/ ureteral stent placement  10/14/2011    Procedure: CYSTOSCOPY WITH RETROGRADE PYELOGRAM/URETERAL STENT PLACEMENT;  Surgeon: Hanley Ben, MD;  Location: [facility];  Service: Urology;  Laterality: Right;  Right Double J Stent  . Upper gastrointestinal endoscopy  2008  . Cervical biopsy  w/ loop electrode excision  04/2003   Social History   Social History  . Marital Status: Married    Spouse Name: N/A  . Number of Children: 2  . Years of Education: N/A   Occupational History  . [employer]   Social History Main Topics  . Smoking status: Current Every Day Smoker -- 0.50 packs/day for 30 years    Types: Cigarettes  . Smokeless tobacco: Never Used  . Alcohol Use: No  . Drug Use: No  . Sexual Activity: Not Currently    Birth Control/ Protection: None   Other Topics Concern  . 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 Anesthesia problems Neg Hx   . Hypotension Neg Hx   . Malignant hyperthermia Neg Hx   . Pseudochol deficiency Neg Hx   . Nephrolithiasis Mother   . Irritable bowel syndrome    . Fibroids Sister   . Skin cancer Mother     from radiation tx  . Cancer Mother     ? type   Allergies  Allergen Reactions  . Nitrofurantoin Monohyd Macro Other (See Comments)    Body aches, nausea and vomiting  . Sulfa Antibiotics     Skin rash, nausea, fever, disoriented   Prior to Admission medications   Medication Sig Start Date End Date Taking? Authorizing Provider  ALPRAZolam (XANAX) 0.5 MG tablet TAKE 1 TABLET (0.5MG  TOTAL) BY MOUTH THREE TIMES DAILY AS NEEDED FOR ANXIETY OR  SLEEP 01/18/16  Yes Darlyne Russian, MD  ibuprofen (ADVIL,MOTRIN) 200 MG tablet Take 200 mg by mouth daily.   Yes Historical Provider, MD  lidocaine (XYLOCAINE) 2 % solution Use as directed 20 mLs in the mouth or throat as needed for mouth pain. 09/24/14  Yes Leandrew Koyanagi, MD  PARoxetine (PAXIL) 40 MG tablet TAKE 1 TABLET (40 MG TOTAL) BY MOUTH DAILY. 01/18/16  Yes Darlyne Russian, MD  promethazine (PHENERGAN) 25 MG tablet Take 25 mg by mouth every 6 (six) hours as needed. Reported on 09/16/2015   Yes Historical Provider, MD  traZODone (DESYREL) 50 MG tablet Take 0.5-1 tablets (25-50 mg total) by mouth at bedtime as needed for sleep. 01/18/16  Yes Darlyne Russian, MD     ROS: The patient denies fevers, chills, night sweats, unintentional weight loss, chest pain, palpitations, wheezing, dyspnea on exertion, nausea, vomiting, abdominal pain, dysuria, hematuria, melena, numbness, weakness, or tingling.  All other systems have been reviewed and were otherwise negative with the exception of those mentioned in the HPI and as above.    PHYSICAL EXAM: Filed Vitals:   02/15/16 1128  BP: 126/80  Pulse: 73  Temp: 98.4 F (36.9 C)  Resp: 17   Body mass index is 29.61 kg/(m^2).   General: Alert, no acute distress HEENT:  Normocephalic, atraumatic, oropharynx patent. Eye: Juliette Mangle [facility] Cardiovascular:  Regular rate and rhythm, no rubs murmurs or gallops.  No Carotid bruits, radial pulse intact. No pedal edema.  Respiratory: Clear to auscultation bilaterally.  No wheezes, rales, or rhonchi.  No cyanosis, no use of accessory musculature Abdominal: No organomegaly, abdomen is soft and non-tender, positive bowel sounds.  No masses. Musculoskeletal: Gait intact. No edema, tenderness Skin: No rashes. Neurologic: Facial musculature symmetric. Psychiatric: Patient acts appropriately throughout our interaction. Lymphatic: No cervical or submandibular lymphadenopathy    LABS:    EKG/XRAY:   Primary  read interpreted by Dr. Everlene Farrier at [facility].   ASSESSMENT/PLAN: Patient is nonfunctional. She cries continuously. She is having deep dark thoughts. She has considered suicide but will not tell me the way she would kill herself. She worries that killing herself would devastate her parents. I called her parents and they were kind enough to come over and help. Patient transported to behavioral health at [facility] for emergency assessment and consideration for inpatient treatment of severe depression.I personally performed the services described in this documentation, which was scribed in my presence. The recorded information has been reviewed and is accurate.   Johney Maine  sideeffects, risk and benefits, and alternatives of medications d/w patient. Patient is aware that all medications have potential sideeffects and we are unable to predict every sideeffect or drug-drug interaction that may occur.  Arlyss Queen MD 02/15/2016 12:00 PM

## 2016-02-16 NOTE — Telephone Encounter (Signed)
Our office has contacted their office to see about a sooner appointment. I will keep trying. For now let's increase the Prozac to 60 mg. I also spoke with her therapist Almyra Free. Almyra Free plans to talk with her tomorrow. I will stay in contact if I have success getting her a sooner appointment.

## 2016-02-16 NOTE — Telephone Encounter (Signed)
Dr. Everlene Farrier did you speak with the psychiatrist?

## 2016-02-17 MED ORDER — PAROXETINE HCL 10 MG PO TABS: 10.0000 mg | ORAL_TABLET | Freq: Every day | ORAL | Status: DC

## 2016-02-17 MED ORDER — ALPRAZOLAM 0.5 MG PO TABS: 0.5000 mg | ORAL_TABLET | Freq: Four times a day (QID) | ORAL | Status: DC | PRN

## 2016-02-17 NOTE — Telephone Encounter (Signed)
Sent in Xanax and Paxil for patient. Left message for pt to call back.

## 2016-02-17 NOTE — Telephone Encounter (Signed)
Paxil you mean right?

## 2016-02-17 NOTE — Telephone Encounter (Signed)
Thank you for catching that time her. The maximum dose of Paxil is actually 50 mg a day so she can take a Paxil 40 mg 1 a day and add a Paxil 10 mg to this to give her a dose of 50 mg a day. I also told her she could take an extra 0.5 Xanax a day until we get her in to see the psychiatrist. If we need to call in next refer this level me know.

## 2016-02-18 ENCOUNTER — Encounter (HOSPITAL_COMMUNITY): Payer: Self-pay | Admitting: Psychiatry

## 2016-02-18 ENCOUNTER — Ambulatory Visit (INDEPENDENT_AMBULATORY_CARE_PROVIDER_SITE_OTHER): Payer: Commercial Indemnity | Admitting: Psychiatry

## 2016-02-18 VITALS — BP 128/74 | HR 63 | Ht 68.0 in | Wt 190.2 lb

## 2016-02-18 DIAGNOSIS — F330 Major depressive disorder, recurrent, mild: Secondary | ICD-10-CM | POA: Insufficient documentation

## 2016-02-18 DIAGNOSIS — F411 Generalized anxiety disorder: Secondary | ICD-10-CM

## 2016-02-18 DIAGNOSIS — F332 Major depressive disorder, recurrent severe without psychotic features: Secondary | ICD-10-CM

## 2016-02-18 DIAGNOSIS — G4700 Insomnia, unspecified: Secondary | ICD-10-CM

## 2016-02-18 HISTORY — DX: Insomnia, unspecified: G47.00

## 2016-02-18 HISTORY — DX: Major depressive disorder, recurrent, mild: F33.0

## 2016-02-18 MED ORDER — LORAZEPAM 0.5 MG PO TABS: 0.5000 mg | ORAL_TABLET | Freq: Three times a day (TID) | ORAL | Status: DC | PRN

## 2016-02-18 MED ORDER — PAROXETINE HCL 40 MG PO TABS: 60.0000 mg | ORAL_TABLET | ORAL | Status: DC
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
## 2016-03-01 ENCOUNTER — Encounter (HOSPITAL_COMMUNITY): Payer: Self-pay | Admitting: Psychiatry

## 2016-03-01 ENCOUNTER — Ambulatory Visit (HOSPITAL_COMMUNITY): Payer: Commercial Indemnity | Admitting: Psychiatry

## 2016-03-01 VITALS — BP 118/76 | HR 75 | Ht 68.0 in | Wt 188.4 lb

## 2016-03-01 DIAGNOSIS — G4700 Insomnia, unspecified: Secondary | ICD-10-CM | POA: Diagnosis not present

## 2016-03-01 DIAGNOSIS — F411 Generalized anxiety disorder: Secondary | ICD-10-CM

## 2016-03-01 DIAGNOSIS — F332 Major depressive disorder, recurrent severe without psychotic features: Secondary | ICD-10-CM

## 2016-03-01 MED ORDER — PAROXETINE HCL 40 MG PO TABS: 60.0000 mg | ORAL_TABLET | ORAL | 2 refills | Status: DC
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
## 2016-03-09 ENCOUNTER — Ambulatory Visit (INDEPENDENT_AMBULATORY_CARE_PROVIDER_SITE_OTHER): Payer: Managed Care, Other (non HMO) | Admitting: Licensed Clinical Social Worker

## 2016-03-09 DIAGNOSIS — F3341 Major depressive disorder, recurrent, in partial remission: Secondary | ICD-10-CM

## 2016-03-21 ENCOUNTER — Ambulatory Visit (HOSPITAL_COMMUNITY): Payer: Self-pay | Admitting: Psychiatry

## 2016-03-29 ENCOUNTER — Ambulatory Visit (INDEPENDENT_AMBULATORY_CARE_PROVIDER_SITE_OTHER): Payer: Managed Care, Other (non HMO) | Admitting: Licensed Clinical Social Worker

## 2016-03-29 DIAGNOSIS — F3341 Major depressive disorder, recurrent, in partial remission: Secondary | ICD-10-CM

## 2016-04-18 ENCOUNTER — Other Ambulatory Visit (HOSPITAL_COMMUNITY): Payer: Self-pay

## 2016-04-18 DIAGNOSIS — F411 Generalized anxiety disorder: Secondary | ICD-10-CM

## 2016-04-18 MED ORDER — LORAZEPAM 0.5 MG PO TABS: 0.5000 mg | ORAL_TABLET | Freq: Three times a day (TID) | ORAL | 1 refills | Status: DC | PRN

## 2016-04-18 NOTE — Progress Notes (Signed)
Pharmacy sent a refill request for patients Lorazepam, refill is appropriate and patient has a follow up in November. Per protocol, I sent a one month order with a refill to the pharmacy to last patient to appointment.

## 2016-05-10 ENCOUNTER — Ambulatory Visit (INDEPENDENT_AMBULATORY_CARE_PROVIDER_SITE_OTHER): Payer: Managed Care, Other (non HMO) | Admitting: Licensed Clinical Social Worker

## 2016-05-10 DIAGNOSIS — F3341 Major depressive disorder, recurrent, in partial remission: Secondary | ICD-10-CM | POA: Diagnosis not present

## 2016-05-13 ENCOUNTER — Other Ambulatory Visit: Payer: Self-pay | Admitting: Emergency Medicine

## 2016-06-01 ENCOUNTER — Ambulatory Visit (INDEPENDENT_AMBULATORY_CARE_PROVIDER_SITE_OTHER): Payer: Managed Care, Other (non HMO) | Admitting: Licensed Clinical Social Worker

## 2016-06-01 DIAGNOSIS — F341 Dysthymic disorder: Secondary | ICD-10-CM | POA: Diagnosis not present

## 2016-06-09 ENCOUNTER — Encounter (HOSPITAL_COMMUNITY): Payer: Self-pay | Admitting: Psychiatry

## 2016-06-09 ENCOUNTER — Ambulatory Visit (INDEPENDENT_AMBULATORY_CARE_PROVIDER_SITE_OTHER): Payer: Managed Care, Other (non HMO) | Admitting: Psychiatry

## 2016-06-09 VITALS — BP 122/68 | HR 77 | Ht 68.0 in | Wt 185.4 lb

## 2016-06-09 DIAGNOSIS — F5105 Insomnia due to other mental disorder: Secondary | ICD-10-CM | POA: Diagnosis not present

## 2016-06-09 DIAGNOSIS — F99 Mental disorder, not otherwise specified: Secondary | ICD-10-CM

## 2016-06-09 DIAGNOSIS — R45851 Suicidal ideations: Secondary | ICD-10-CM

## 2016-06-09 DIAGNOSIS — Z8489 Family history of other specified conditions: Secondary | ICD-10-CM

## 2016-06-09 DIAGNOSIS — F411 Generalized anxiety disorder: Secondary | ICD-10-CM

## 2016-06-09 DIAGNOSIS — Z840 Family history of diseases of the skin and subcutaneous tissue: Secondary | ICD-10-CM

## 2016-06-09 DIAGNOSIS — F332 Major depressive disorder, recurrent severe without psychotic features: Secondary | ICD-10-CM | POA: Diagnosis not present

## 2016-06-09 DIAGNOSIS — Z818 Family history of other mental and behavioral disorders: Secondary | ICD-10-CM

## 2016-06-09 DIAGNOSIS — Z888 Allergy status to other drugs, medicaments and biological substances status: Secondary | ICD-10-CM

## 2016-06-09 DIAGNOSIS — Z811 Family history of alcohol abuse and dependence: Secondary | ICD-10-CM

## 2016-06-09 DIAGNOSIS — Z79899 Other long term (current) drug therapy: Secondary | ICD-10-CM

## 2016-06-09 DIAGNOSIS — Z808 Family history of malignant neoplasm of other organs or systems: Secondary | ICD-10-CM

## 2016-06-09 DIAGNOSIS — F17210 Nicotine dependence, cigarettes, uncomplicated: Secondary | ICD-10-CM

## 2016-06-09 MED ORDER — LORAZEPAM 0.5 MG PO TABS: 0.5000 mg | ORAL_TABLET | Freq: Three times a day (TID) | ORAL | 0 refills | Status: DC | PRN

## 2016-06-09 MED ORDER — LITHIUM CARBONATE ER 300 MG PO TBCR: 300.0000 mg | EXTENDED_RELEASE_TABLET | Freq: Every day | ORAL | 0 refills | Status: DC

## 2016-06-09 MED ORDER — PAROXETINE HCL 40 MG PO TABS: 60.0000 mg | ORAL_TABLET | ORAL | 0 refills | Status: DC

## 2016-06-09 MED ORDER — TRAZODONE HCL 50 MG PO TABS: ORAL_TABLET | ORAL | 0 refills | Status: DC
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
## 2016-06-12 ENCOUNTER — Other Ambulatory Visit: Payer: Self-pay | Admitting: Physician Assistant

## 2016-06-16 ENCOUNTER — Ambulatory Visit (INDEPENDENT_AMBULATORY_CARE_PROVIDER_SITE_OTHER): Payer: Managed Care, Other (non HMO) | Admitting: Psychiatry

## 2016-06-16 ENCOUNTER — Encounter (HOSPITAL_COMMUNITY): Payer: Self-pay | Admitting: Psychiatry

## 2016-06-16 DIAGNOSIS — F332 Major depressive disorder, recurrent severe without psychotic features: Secondary | ICD-10-CM

## 2016-06-16 DIAGNOSIS — R45851 Suicidal ideations: Secondary | ICD-10-CM

## 2016-06-16 DIAGNOSIS — F5105 Insomnia due to other mental disorder: Secondary | ICD-10-CM | POA: Diagnosis not present

## 2016-06-16 DIAGNOSIS — F411 Generalized anxiety disorder: Secondary | ICD-10-CM | POA: Diagnosis not present

## 2016-06-16 DIAGNOSIS — F99 Mental disorder, not otherwise specified: Secondary | ICD-10-CM

## 2016-06-16 DIAGNOSIS — Z79899 Other long term (current) drug therapy: Secondary | ICD-10-CM

## 2016-06-16 DIAGNOSIS — Z818 Family history of other mental and behavioral disorders: Secondary | ICD-10-CM

## 2016-06-16 DIAGNOSIS — Z808 Family history of malignant neoplasm of other organs or systems: Secondary | ICD-10-CM

## 2016-06-16 DIAGNOSIS — F17210 Nicotine dependence, cigarettes, uncomplicated: Secondary | ICD-10-CM

## 2016-06-16 DIAGNOSIS — Z811 Family history of alcohol abuse and dependence: Secondary | ICD-10-CM

## 2016-06-16 MED ORDER — LORAZEPAM 0.5 MG PO TABS: 0.5000 mg | ORAL_TABLET | Freq: Three times a day (TID) | ORAL | 2 refills | Status: DC | PRN

## 2016-06-16 MED ORDER — TRAZODONE HCL 50 MG PO TABS: ORAL_TABLET | ORAL | 2 refills | Status: DC

## 2016-06-16 MED ORDER — PAROXETINE HCL 40 MG PO TABS: 60.0000 mg | ORAL_TABLET | ORAL | 2 refills | Status: DC

## 2016-06-16 MED ORDER — LITHIUM CARBONATE ER 300 MG PO TBCR: 300.0000 mg | EXTENDED_RELEASE_TABLET | Freq: Every day | ORAL | 2 refills | Status: DC
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
## 2016-06-22 ENCOUNTER — Ambulatory Visit (INDEPENDENT_AMBULATORY_CARE_PROVIDER_SITE_OTHER): Payer: Managed Care, Other (non HMO) | Admitting: Licensed Clinical Social Worker

## 2016-06-22 DIAGNOSIS — F3341 Major depressive disorder, recurrent, in partial remission: Secondary | ICD-10-CM | POA: Diagnosis not present

## 2016-07-13 ENCOUNTER — Ambulatory Visit (INDEPENDENT_AMBULATORY_CARE_PROVIDER_SITE_OTHER): Payer: Managed Care, Other (non HMO) | Admitting: Licensed Clinical Social Worker

## 2016-07-13 DIAGNOSIS — F3341 Major depressive disorder, recurrent, in partial remission: Secondary | ICD-10-CM

## 2016-08-17 ENCOUNTER — Ambulatory Visit (INDEPENDENT_AMBULATORY_CARE_PROVIDER_SITE_OTHER): Payer: Managed Care, Other (non HMO) | Admitting: Licensed Clinical Social Worker

## 2016-08-17 DIAGNOSIS — F331 Major depressive disorder, recurrent, moderate: Secondary | ICD-10-CM

## 2016-08-18 ENCOUNTER — Encounter (HOSPITAL_COMMUNITY): Payer: Self-pay | Admitting: Psychiatry

## 2016-08-18 ENCOUNTER — Ambulatory Visit (INDEPENDENT_AMBULATORY_CARE_PROVIDER_SITE_OTHER): Payer: Managed Care, Other (non HMO) | Admitting: Psychiatry

## 2016-08-18 DIAGNOSIS — Z8489 Family history of other specified conditions: Secondary | ICD-10-CM

## 2016-08-18 DIAGNOSIS — F99 Mental disorder, not otherwise specified: Secondary | ICD-10-CM | POA: Diagnosis not present

## 2016-08-18 DIAGNOSIS — Z9889 Other specified postprocedural states: Secondary | ICD-10-CM

## 2016-08-18 DIAGNOSIS — F332 Major depressive disorder, recurrent severe without psychotic features: Secondary | ICD-10-CM | POA: Diagnosis not present

## 2016-08-18 DIAGNOSIS — Z882 Allergy status to sulfonamides status: Secondary | ICD-10-CM

## 2016-08-18 DIAGNOSIS — Z811 Family history of alcohol abuse and dependence: Secondary | ICD-10-CM

## 2016-08-18 DIAGNOSIS — Z888 Allergy status to other drugs, medicaments and biological substances status: Secondary | ICD-10-CM

## 2016-08-18 DIAGNOSIS — F5105 Insomnia due to other mental disorder: Secondary | ICD-10-CM | POA: Diagnosis not present

## 2016-08-18 DIAGNOSIS — Z79899 Other long term (current) drug therapy: Secondary | ICD-10-CM

## 2016-08-18 DIAGNOSIS — F411 Generalized anxiety disorder: Secondary | ICD-10-CM | POA: Diagnosis not present

## 2016-08-18 DIAGNOSIS — F17210 Nicotine dependence, cigarettes, uncomplicated: Secondary | ICD-10-CM

## 2016-08-18 DIAGNOSIS — Z818 Family history of other mental and behavioral disorders: Secondary | ICD-10-CM

## 2016-08-18 MED ORDER — LITHIUM CARBONATE ER 450 MG PO TBCR: 450.0000 mg | EXTENDED_RELEASE_TABLET | Freq: Every day | ORAL | 2 refills | Status: DC

## 2016-08-18 MED ORDER — TRAZODONE HCL 50 MG PO TABS: ORAL_TABLET | ORAL | 2 refills | Status: DC

## 2016-08-18 MED ORDER — PAROXETINE HCL 40 MG PO TABS: 60.0000 mg | ORAL_TABLET | ORAL | 2 refills | Status: DC

## 2016-08-18 MED ORDER — LORAZEPAM 0.5 MG PO TABS: 0.5000 mg | ORAL_TABLET | Freq: Three times a day (TID) | ORAL | 2 refills | Status: DC | PRN
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
## 2016-09-14 ENCOUNTER — Ambulatory Visit (INDEPENDENT_AMBULATORY_CARE_PROVIDER_SITE_OTHER): Payer: Managed Care, Other (non HMO) | Admitting: Licensed Clinical Social Worker

## 2016-09-14 DIAGNOSIS — F3341 Major depressive disorder, recurrent, in partial remission: Secondary | ICD-10-CM | POA: Diagnosis not present

## 2016-09-29 ENCOUNTER — Encounter (HOSPITAL_COMMUNITY): Payer: Self-pay | Admitting: Psychiatry

## 2016-09-29 ENCOUNTER — Ambulatory Visit (INDEPENDENT_AMBULATORY_CARE_PROVIDER_SITE_OTHER): Payer: Managed Care, Other (non HMO) | Admitting: Psychiatry

## 2016-09-29 DIAGNOSIS — Z9889 Other specified postprocedural states: Secondary | ICD-10-CM

## 2016-09-29 DIAGNOSIS — Z8379 Family history of other diseases of the digestive system: Secondary | ICD-10-CM

## 2016-09-29 DIAGNOSIS — F17210 Nicotine dependence, cigarettes, uncomplicated: Secondary | ICD-10-CM | POA: Diagnosis not present

## 2016-09-29 DIAGNOSIS — Z8489 Family history of other specified conditions: Secondary | ICD-10-CM | POA: Diagnosis not present

## 2016-09-29 DIAGNOSIS — Z818 Family history of other mental and behavioral disorders: Secondary | ICD-10-CM | POA: Diagnosis not present

## 2016-09-29 DIAGNOSIS — Z888 Allergy status to other drugs, medicaments and biological substances status: Secondary | ICD-10-CM

## 2016-09-29 DIAGNOSIS — Z9851 Tubal ligation status: Secondary | ICD-10-CM | POA: Diagnosis not present

## 2016-09-29 DIAGNOSIS — Z811 Family history of alcohol abuse and dependence: Secondary | ICD-10-CM | POA: Diagnosis not present

## 2016-09-29 DIAGNOSIS — Z90710 Acquired absence of both cervix and uterus: Secondary | ICD-10-CM

## 2016-09-29 DIAGNOSIS — F332 Major depressive disorder, recurrent severe without psychotic features: Secondary | ICD-10-CM | POA: Diagnosis not present

## 2016-09-29 DIAGNOSIS — Z79899 Other long term (current) drug therapy: Secondary | ICD-10-CM

## 2016-09-29 DIAGNOSIS — F411 Generalized anxiety disorder: Secondary | ICD-10-CM | POA: Diagnosis not present

## 2016-09-29 DIAGNOSIS — G4700 Insomnia, unspecified: Secondary | ICD-10-CM

## 2016-09-29 DIAGNOSIS — Z882 Allergy status to sulfonamides status: Secondary | ICD-10-CM
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
## 2016-10-03 ENCOUNTER — Other Ambulatory Visit (HOSPITAL_COMMUNITY): Payer: Self-pay | Admitting: Psychiatry

## 2016-10-03 DIAGNOSIS — F332 Major depressive disorder, recurrent severe without psychotic features: Secondary | ICD-10-CM

## 2016-10-05 ENCOUNTER — Ambulatory Visit (INDEPENDENT_AMBULATORY_CARE_PROVIDER_SITE_OTHER): Payer: Managed Care, Other (non HMO) | Admitting: Licensed Clinical Social Worker

## 2016-10-05 DIAGNOSIS — F3341 Major depressive disorder, recurrent, in partial remission: Secondary | ICD-10-CM

## 2016-11-02 ENCOUNTER — Ambulatory Visit (INDEPENDENT_AMBULATORY_CARE_PROVIDER_SITE_OTHER): Payer: Managed Care, Other (non HMO) | Admitting: Licensed Clinical Social Worker

## 2016-11-02 DIAGNOSIS — F3341 Major depressive disorder, recurrent, in partial remission: Secondary | ICD-10-CM | POA: Diagnosis not present

## 2016-11-13 ENCOUNTER — Other Ambulatory Visit (HOSPITAL_COMMUNITY): Payer: Self-pay | Admitting: Psychiatry

## 2016-11-13 DIAGNOSIS — F332 Major depressive disorder, recurrent severe without psychotic features: Secondary | ICD-10-CM

## 2016-11-15 ENCOUNTER — Other Ambulatory Visit (HOSPITAL_COMMUNITY): Payer: Self-pay | Admitting: Psychiatry

## 2016-11-15 DIAGNOSIS — F332 Major depressive disorder, recurrent severe without psychotic features: Secondary | ICD-10-CM

## 2016-12-29 ENCOUNTER — Encounter (HOSPITAL_COMMUNITY): Payer: Self-pay | Admitting: Psychiatry

## 2016-12-29 ENCOUNTER — Ambulatory Visit (INDEPENDENT_AMBULATORY_CARE_PROVIDER_SITE_OTHER): Payer: Managed Care, Other (non HMO) | Admitting: Psychiatry

## 2016-12-29 DIAGNOSIS — F17210 Nicotine dependence, cigarettes, uncomplicated: Secondary | ICD-10-CM | POA: Diagnosis not present

## 2016-12-29 DIAGNOSIS — Z818 Family history of other mental and behavioral disorders: Secondary | ICD-10-CM

## 2016-12-29 DIAGNOSIS — F99 Mental disorder, not otherwise specified: Principal | ICD-10-CM

## 2016-12-29 DIAGNOSIS — Z811 Family history of alcohol abuse and dependence: Secondary | ICD-10-CM | POA: Diagnosis not present

## 2016-12-29 DIAGNOSIS — F5105 Insomnia due to other mental disorder: Secondary | ICD-10-CM

## 2016-12-29 DIAGNOSIS — G4700 Insomnia, unspecified: Secondary | ICD-10-CM

## 2016-12-29 DIAGNOSIS — Z79899 Other long term (current) drug therapy: Secondary | ICD-10-CM

## 2016-12-29 DIAGNOSIS — F411 Generalized anxiety disorder: Secondary | ICD-10-CM

## 2016-12-29 DIAGNOSIS — Z888 Allergy status to other drugs, medicaments and biological substances status: Secondary | ICD-10-CM | POA: Diagnosis not present

## 2016-12-29 DIAGNOSIS — Z882 Allergy status to sulfonamides status: Secondary | ICD-10-CM

## 2016-12-29 DIAGNOSIS — F332 Major depressive disorder, recurrent severe without psychotic features: Secondary | ICD-10-CM

## 2016-12-29 DIAGNOSIS — Z791 Long term (current) use of non-steroidal anti-inflammatories (NSAID): Secondary | ICD-10-CM

## 2016-12-29 MED ORDER — LITHIUM CARBONATE ER 450 MG PO TBCR: 450.0000 mg | EXTENDED_RELEASE_TABLET | Freq: Every day | ORAL | 0 refills | Status: DC

## 2016-12-29 MED ORDER — TRAZODONE HCL 50 MG PO TABS: ORAL_TABLET | ORAL | 0 refills | Status: DC

## 2016-12-29 MED ORDER — PAROXETINE HCL 40 MG PO TABS: 60.0000 mg | ORAL_TABLET | ORAL | 0 refills | Status: DC
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
## 2017-01-04 ENCOUNTER — Ambulatory Visit (INDEPENDENT_AMBULATORY_CARE_PROVIDER_SITE_OTHER): Payer: Managed Care, Other (non HMO) | Admitting: Licensed Clinical Social Worker

## 2017-01-04 DIAGNOSIS — F3341 Major depressive disorder, recurrent, in partial remission: Secondary | ICD-10-CM | POA: Diagnosis not present

## 2017-02-06 ENCOUNTER — Other Ambulatory Visit (HOSPITAL_COMMUNITY): Payer: Self-pay

## 2017-02-06 DIAGNOSIS — F332 Major depressive disorder, recurrent severe without psychotic features: Secondary | ICD-10-CM

## 2017-02-06 DIAGNOSIS — F99 Mental disorder, not otherwise specified: Secondary | ICD-10-CM

## 2017-02-06 DIAGNOSIS — F5105 Insomnia due to other mental disorder: Secondary | ICD-10-CM

## 2017-02-06 DIAGNOSIS — F411 Generalized anxiety disorder: Secondary | ICD-10-CM

## 2017-02-06 MED ORDER — TRAZODONE HCL 50 MG PO TABS: ORAL_TABLET | ORAL | 0 refills | Status: DC

## 2017-02-06 MED ORDER — LITHIUM CARBONATE ER 450 MG PO TBCR: 450.0000 mg | EXTENDED_RELEASE_TABLET | Freq: Every day | ORAL | 0 refills | Status: DC

## 2017-02-06 MED ORDER — PAROXETINE HCL 40 MG PO TABS: 60.0000 mg | ORAL_TABLET | ORAL | 0 refills | Status: DC

## 2017-02-22 ENCOUNTER — Ambulatory Visit: Payer: Managed Care, Other (non HMO) | Admitting: Licensed Clinical Social Worker

## 2017-03-16 ENCOUNTER — Ambulatory Visit (INDEPENDENT_AMBULATORY_CARE_PROVIDER_SITE_OTHER): Payer: Managed Care, Other (non HMO) | Admitting: Licensed Clinical Social Worker

## 2017-03-16 DIAGNOSIS — F3341 Major depressive disorder, recurrent, in partial remission: Secondary | ICD-10-CM | POA: Diagnosis not present

## 2017-04-06 ENCOUNTER — Encounter (HOSPITAL_COMMUNITY): Payer: Self-pay | Admitting: Psychiatry

## 2017-04-06 ENCOUNTER — Ambulatory Visit (INDEPENDENT_AMBULATORY_CARE_PROVIDER_SITE_OTHER): Payer: Managed Care, Other (non HMO) | Admitting: Psychiatry

## 2017-04-06 DIAGNOSIS — Z818 Family history of other mental and behavioral disorders: Secondary | ICD-10-CM

## 2017-04-06 DIAGNOSIS — F99 Mental disorder, not otherwise specified: Secondary | ICD-10-CM

## 2017-04-06 DIAGNOSIS — Z811 Family history of alcohol abuse and dependence: Secondary | ICD-10-CM | POA: Diagnosis not present

## 2017-04-06 DIAGNOSIS — F17210 Nicotine dependence, cigarettes, uncomplicated: Secondary | ICD-10-CM | POA: Diagnosis not present

## 2017-04-06 DIAGNOSIS — F332 Major depressive disorder, recurrent severe without psychotic features: Secondary | ICD-10-CM

## 2017-04-06 DIAGNOSIS — F5105 Insomnia due to other mental disorder: Secondary | ICD-10-CM | POA: Diagnosis not present

## 2017-04-06 DIAGNOSIS — F411 Generalized anxiety disorder: Secondary | ICD-10-CM | POA: Diagnosis not present

## 2017-04-06 MED ORDER — LITHIUM CARBONATE ER 450 MG PO TBCR: 450.0000 mg | EXTENDED_RELEASE_TABLET | Freq: Every day | ORAL | 0 refills | Status: DC

## 2017-04-06 MED ORDER — TRAZODONE HCL 50 MG PO TABS: ORAL_TABLET | ORAL | 0 refills | Status: DC

## 2017-04-06 MED ORDER — PAROXETINE HCL 40 MG PO TABS: 60.0000 mg | ORAL_TABLET | ORAL | 0 refills | Status: DC
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
## 2017-04-26 ENCOUNTER — Ambulatory Visit (INDEPENDENT_AMBULATORY_CARE_PROVIDER_SITE_OTHER): Payer: Managed Care, Other (non HMO) | Admitting: Licensed Clinical Social Worker

## 2017-04-26 DIAGNOSIS — F3341 Major depressive disorder, recurrent, in partial remission: Secondary | ICD-10-CM

## 2017-06-13 ENCOUNTER — Encounter: Payer: Self-pay | Admitting: Internal Medicine

## 2017-06-21 ENCOUNTER — Ambulatory Visit: Payer: Managed Care, Other (non HMO) | Admitting: Licensed Clinical Social Worker

## 2017-07-04 ENCOUNTER — Encounter: Payer: Self-pay | Admitting: Internal Medicine

## 2017-07-12 ENCOUNTER — Ambulatory Visit: Payer: 59 | Admitting: Licensed Clinical Social Worker

## 2017-07-12 DIAGNOSIS — F3341 Major depressive disorder, recurrent, in partial remission: Secondary | ICD-10-CM | POA: Diagnosis not present

## 2017-07-13 ENCOUNTER — Ambulatory Visit (INDEPENDENT_AMBULATORY_CARE_PROVIDER_SITE_OTHER): Payer: 59 | Admitting: Psychiatry

## 2017-07-13 ENCOUNTER — Other Ambulatory Visit: Payer: Self-pay

## 2017-07-13 ENCOUNTER — Encounter (HOSPITAL_COMMUNITY): Payer: Self-pay | Admitting: Psychiatry

## 2017-07-13 DIAGNOSIS — F5105 Insomnia due to other mental disorder: Secondary | ICD-10-CM | POA: Diagnosis not present

## 2017-07-13 DIAGNOSIS — Z91411 Personal history of adult psychological abuse: Secondary | ICD-10-CM | POA: Diagnosis not present

## 2017-07-13 DIAGNOSIS — Z818 Family history of other mental and behavioral disorders: Secondary | ICD-10-CM | POA: Diagnosis not present

## 2017-07-13 DIAGNOSIS — F17210 Nicotine dependence, cigarettes, uncomplicated: Secondary | ICD-10-CM

## 2017-07-13 DIAGNOSIS — F411 Generalized anxiety disorder: Secondary | ICD-10-CM | POA: Diagnosis not present

## 2017-07-13 DIAGNOSIS — Z811 Family history of alcohol abuse and dependence: Secondary | ICD-10-CM | POA: Diagnosis not present

## 2017-07-13 DIAGNOSIS — F99 Mental disorder, not otherwise specified: Secondary | ICD-10-CM | POA: Diagnosis not present

## 2017-07-13 DIAGNOSIS — F332 Major depressive disorder, recurrent severe without psychotic features: Secondary | ICD-10-CM

## 2017-07-13 DIAGNOSIS — Z915 Personal history of self-harm: Secondary | ICD-10-CM

## 2017-07-13 DIAGNOSIS — Z79899 Other long term (current) drug therapy: Secondary | ICD-10-CM

## 2017-07-13 MED ORDER — TRAZODONE HCL 50 MG PO TABS: ORAL_TABLET | ORAL | 0 refills | Status: DC

## 2017-07-13 MED ORDER — PAROXETINE HCL 40 MG PO TABS: 60.0000 mg | ORAL_TABLET | ORAL | 0 refills | Status: DC

## 2017-07-13 MED ORDER — LITHIUM CARBONATE ER 450 MG PO TBCR: 450.0000 mg | EXTENDED_RELEASE_TABLET | Freq: Every day | ORAL | 0 refills | Status: DC
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
## 2017-08-03 ENCOUNTER — Encounter: Payer: Self-pay | Admitting: Obstetrics and Gynecology

## 2017-08-10 ENCOUNTER — Ambulatory Visit: Payer: Managed Care, Other (non HMO) | Admitting: Physician Assistant

## 2017-08-10 ENCOUNTER — Other Ambulatory Visit: Payer: Self-pay

## 2017-08-10 ENCOUNTER — Encounter: Payer: Self-pay | Admitting: Physician Assistant

## 2017-08-10 ENCOUNTER — Emergency Department (HOSPITAL_COMMUNITY): Payer: 59

## 2017-08-10 ENCOUNTER — Emergency Department (HOSPITAL_COMMUNITY)
Admission: EM | Admit: 2017-08-10 | Discharge: 2017-08-10 | Disposition: A | Discharge status: Home / Self Care | Payer: 59 | Attending: Emergency Medicine | Admitting: Emergency Medicine

## 2017-08-10 VITALS — BP 130/76 | HR 71 | Temp 97.8°F | Resp 18 | Ht 67.84 in | Wt 220.4 lb

## 2017-08-10 DIAGNOSIS — R52 Pain, unspecified: Secondary | ICD-10-CM | POA: Diagnosis not present

## 2017-08-10 DIAGNOSIS — M6281 Muscle weakness (generalized): Secondary | ICD-10-CM | POA: Insufficient documentation

## 2017-08-10 DIAGNOSIS — D72829 Elevated white blood cell count, unspecified: Secondary | ICD-10-CM

## 2017-08-10 DIAGNOSIS — R51 Headache: Secondary | ICD-10-CM | POA: Diagnosis not present

## 2017-08-10 DIAGNOSIS — R5383 Other fatigue: Secondary | ICD-10-CM | POA: Diagnosis present

## 2017-08-10 DIAGNOSIS — M549 Dorsalgia, unspecified: Secondary | ICD-10-CM | POA: Diagnosis not present

## 2017-08-10 DIAGNOSIS — M542 Cervicalgia: Secondary | ICD-10-CM | POA: Diagnosis not present

## 2017-08-10 DIAGNOSIS — R519 Headache, unspecified: Secondary | ICD-10-CM

## 2017-08-10 DIAGNOSIS — F17210 Nicotine dependence, cigarettes, uncomplicated: Secondary | ICD-10-CM | POA: Diagnosis not present

## 2017-08-10 DIAGNOSIS — Z79899 Other long term (current) drug therapy: Secondary | ICD-10-CM | POA: Diagnosis not present

## 2017-08-10 DIAGNOSIS — E860 Dehydration: Secondary | ICD-10-CM

## 2017-08-10 DIAGNOSIS — M546 Pain in thoracic spine: Secondary | ICD-10-CM | POA: Diagnosis not present

## 2017-08-10 DIAGNOSIS — R531 Weakness: Secondary | ICD-10-CM

## 2017-08-10 LAB — POCT CBC
Granulocyte percent: 71 %G (ref 37–80)
HEMATOCRIT: 45.4 % (ref 37.7–47.9)
HEMOGLOBIN: 15.5 g/dL (ref 12.2–16.2)
LYMPH, POC: 3.4 (ref 0.6–3.4)
MCH, POC: 29.3 pg (ref 27–31.2)
MCHC: 34.1 g/dL (ref 31.8–35.4)
MCV: 86 fL (ref 80–97)
MID (cbc): 0.6 (ref 0–0.9)
MPV: 6.3 fL (ref 0–99.8)
POC GRANULOCYTE: 9.9 — AB (ref 2–6.9)
POC LYMPH PERCENT: 24.5 %L (ref 10–50)
POC MID %: 4.5 %M (ref 0–12)
Platelet Count, POC: 308 10*3/uL (ref 142–424)
RBC: 5.28 M/uL (ref 4.04–5.48)
RDW, POC: 13.7 %
WBC: 14 10*3/uL — AB (ref 4.6–10.2)

## 2017-08-10 LAB — BASIC METABOLIC PANEL
Anion gap: 8 (ref 5–15)
BUN: 11 mg/dL (ref 6–20)
CHLORIDE: 105 mmol/L (ref 101–111)
CO2: 23 mmol/L (ref 22–32)
CREATININE: 0.7 mg/dL (ref 0.44–1.00)
Calcium: 8.5 mg/dL — ABNORMAL LOW (ref 8.9–10.3)
GFR calc non Af Amer: >60 mL/min (ref >60)
Glucose, Bld: 96 mg/dL (ref 65–99)
Potassium: 4.3 mmol/L (ref 3.5–5.1)
Sodium: 136 mmol/L (ref 135–145)

## 2017-08-10 LAB — CBC
HCT: 44.7 % (ref 36.0–46.0)
Hemoglobin: 15.3 g/dL — ABNORMAL HIGH (ref 12.0–15.0)
MCH: 29.4 pg (ref 26.0–34.0)
MCHC: 34.2 g/dL (ref 30.0–36.0)
MCV: 85.8 fL (ref 78.0–100.0)
PLATELETS: 249 10*3/uL (ref 150–400)
RBC: 5.21 MIL/uL — AB (ref 3.87–5.11)
RDW: 14 % (ref 11.5–15.5)
WBC: 14.1 10*3/uL — ABNORMAL HIGH (ref 4.0–10.5)

## 2017-08-10 LAB — URINALYSIS, ROUTINE W REFLEX MICROSCOPIC
Bilirubin Urine: NEGATIVE
GLUCOSE, UA: NEGATIVE mg/dL
HGB URINE DIPSTICK: NEGATIVE
Ketones, ur: NEGATIVE mg/dL
Leukocytes, UA: NEGATIVE
Nitrite: NEGATIVE
PH: 7 (ref 5.0–8.0)
Protein, ur: NEGATIVE mg/dL
SPECIFIC GRAVITY, URINE: 1.012 (ref 1.005–1.030)

## 2017-08-10 LAB — HEPATIC FUNCTION PANEL
ALK PHOS: 81 U/L (ref 38–126)
ALT: 26 U/L (ref 14–54)
AST: 32 U/L (ref 15–41)
Albumin: 3.6 g/dL (ref 3.5–5.0)
BILIRUBIN DIRECT: 0.1 mg/dL (ref 0.1–0.5)
BILIRUBIN INDIRECT: 0.4 mg/dL (ref 0.3–0.9)
BILIRUBIN TOTAL: 0.5 mg/dL (ref 0.3–1.2)
Total Protein: 6.7 g/dL (ref 6.5–8.1)

## 2017-08-10 LAB — LITHIUM LEVEL: Lithium Lvl: 0.35 mmol/L — ABNORMAL LOW (ref 0.60–1.20)

## 2017-08-10 LAB — POCT INFLUENZA A/B
Influenza A, POC: NEGATIVE
Influenza B, POC: NEGATIVE

## 2017-08-10 LAB — I-STAT CG4 LACTIC ACID, ED: Lactic Acid, Venous: 1.4 mmol/L (ref 0.5–1.9)

## 2017-08-10 LAB — LIPASE, BLOOD: Lipase: 31 U/L (ref 11–51)

## 2017-08-10 LAB — CK: Total CK: 71 U/L (ref 38–234)

## 2017-08-10 MED ORDER — ONDANSETRON HCL 4 MG/2ML IJ SOLN
4.0000 mg | Freq: Once | INTRAMUSCULAR | Status: AC
  Administered 2017-08-10: 4 mg via INTRAVENOUS
  Filled 2017-08-10: qty 2

## 2017-08-10 MED ORDER — SODIUM CHLORIDE 0.9 % IV BOLUS (SEPSIS)
1000.0000 mL | Freq: Once | INTRAVENOUS | Status: AC
  Administered 2017-08-10: 1000 mL via INTRAVENOUS

## 2017-08-10 MED ORDER — KETOROLAC TROMETHAMINE 30 MG/ML IJ SOLN
30.0000 mg | Freq: Once | INTRAMUSCULAR | Status: AC
  Administered 2017-08-10: 30 mg via INTRAVENOUS
  Filled 2017-08-10: qty 1

## 2017-08-10 MED ORDER — SODIUM CHLORIDE 0.9 % IV BOLUS (SEPSIS)
1000.0000 mL | Freq: Once | INTRAVENOUS | Status: AC
  Administered 2017-08-10 (×2): 1000 mL via INTRAVENOUS

## 2017-08-10 MED ORDER — METHOCARBAMOL 500 MG PO TABS: 500.0000 mg | ORAL_TABLET | Freq: Two times a day (BID) | ORAL | 0 refills | Status: DC

## 2017-08-10 NOTE — Patient Instructions (Signed)
     IF you received an x-ray today, you will receive an invoice from [facility]. Please contact [facility] at [phone] with questions or concerns regarding your invoice.   IF you received labwork today, you will receive an invoice from [facility]. Please contact [facility] at [phone] with questions or concerns regarding your invoice.   Our billing staff will not be able to assist you with questions regarding bills from these companies.  You will be contacted with the lab results as soon as they are available. The fastest way to get your results is to activate your My Chart account. Instructions are located on the last page of this paperwork. If you have not heard from us regarding the results in 2 weeks, please contact this office.     

## 2017-08-10 NOTE — Progress Notes (Signed)
MRN: [id number] DOB: [birth year]  Subjective:   Jennifer [name] is a 52 y.o. female presenting for chief complaint of Generalized Body Aches (X 2 day); Nausea (with chills); and Back Pain (X today- mid-back) . Notes she woke up two days with a fever blister on her mouth, then developed severe fatigue and body aches. Woke up this morning with severe mid back pain, along her spine. Describes it as aching like a "cold has settled in the back." Rates the pain a 9/10. Has associated nausea, dry heaving, fever, chills, headache, neck pain, and dry cough. Back pain is aggravated by moving her neck. Denies saddle anesthesia, numbness, tingling, weakness of extremities, abdominal pain, urinary frequency, and dysuria.Pt has hx of back pain but notes this feels different. Denies acute injury. Has taken some ibuprofen for headache this morning with no full relief. Smokes 1/2 ppd x 30+ years. No alcohol use. Did not have flu shot this year. Denies any other aggravating or relieving factors, no other questions or concerns.  Pollyann has a current medication list which includes the following prescription(s): ibuprofen, lithium carbonate, paroxetine, promethazine, and trazodone. Also is allergic to nitrofurantoin monohyd macro and sulfa antibiotics.  Alisa  has a past medical history of Allergy, Cancer ([place]), Depression, Diverticulosis, Fundic gland polyps of stomach, benign, kidney stone, Low HDL (under 40), Personal history of adenomatous colonic polyps (05/08/2012), Rosacea, Tenosynovitis, de Quervain, and Urosepsis (10/2011). Also  has a past surgical history that includes Tubal ligation; Oophorectomy; Vaginal hysterectomy; Umbilical hernia repair; Wisdom tooth extraction; Cystoscopy w/ ureteral stent placement (10/14/2011); Upper gastrointestinal endoscopy (2008); Cervical biopsy w/ loop electrode excision (04/2003); and Multiple tooth extractions.   Objective:   Vitals: BP 130/76 (BP Location: Left Arm, Patient  Position: Sitting, Cuff Size: Normal)   Pulse 71   Temp 97.8 F (36.6 C) (Oral)   Resp 18   Ht 5' 7.84" (1.723 m)   Wt 220 lb 6.4 oz (100 kg)   SpO2 99%   BMI 33.67 kg/m   Physical Exam  Constitutional: She is oriented to person, place, and time. She appears well-developed and well-nourished. She has a sickly appearance. She appears distressed (appears uncomfortable sitting in exam chair).  She is alert but appears lethargic, keeps asking "can I please just go to sleep?"   HENT:  Head: Normocephalic and atraumatic.  Nose: Mucosal edema present. Right sinus exhibits no maxillary sinus tenderness and no frontal sinus tenderness. Left sinus exhibits no maxillary sinus tenderness and no frontal sinus tenderness.  Mouth/Throat: Uvula is midline, oropharynx is clear and moist and mucous membranes are normal.  Eyes: EOM are normal. Pupils are equal, round, and reactive to light. Right conjunctiva is injected (mild). Left conjunctiva is injected (mild).  Neck: Spinous process tenderness present. No muscular tenderness present. Decreased range of motion present. No edema and no erythema present. No Brudzinski's sign noted.  Cardiovascular: Normal rate, regular rhythm and normal heart sounds.  Pulmonary/Chest: Effort normal and breath sounds normal. She has no wheezes. She has no rhonchi. She has no rales.  Musculoskeletal:       Cervical back: She exhibits tenderness.       Thoracic back: She exhibits tenderness.  Exquisite midline tenderness to light and deep palpation from cervical spine to thoracic spine. No muscular tenderness palpated.   Lymphadenopathy:       Head (right side): No submental, no submandibular, no tonsillar, no preauricular, no posterior auricular and no occipital adenopathy present.  Head (left side): No submental, no submandibular, no tonsillar, no preauricular, no posterior auricular and no occipital adenopathy present.    She has no cervical adenopathy.        Right: No supraclavicular adenopathy present.       Left: No supraclavicular adenopathy present.  Neurological: She is oriented to person, place, and time. She has normal strength. No cranial nerve deficit.  Reflex Scores:      Patellar reflexes are 2+ on the right side and 2+ on the left side.      Achilles reflexes are 2+ on the right side and 2+ on the left side. Will not straigten legs when lying on exam table due to midline back pain.  Pain reported that shots from cervical spine to thoracic spine with forward flexion of neck.   Skin: Skin is warm and dry.  Flushed appearance  Psychiatric: She has a normal mood and affect.  Vitals reviewed.   Results for orders placed or performed in visit on 08/10/17 (from the past 24 hour(s))  POCT Influenza A/B     Status: None   Collection Time: 08/10/17  9:18 AM  Result Value Ref Range   Influenza A, POC Negative Negative   Influenza B, POC Negative Negative  POCT CBC     Status: Abnormal   Collection Time: 08/10/17  9:57 AM  Result Value Ref Range   WBC 14.0 (A) 4.6 - 10.2 K/uL   Lymph, poc 3.4 0.6 - 3.4   POC LYMPH PERCENT 24.5 10 - 50 %L   MID (cbc) 0.6 0 - 0.9   POC MID % 4.5 0 - 12 %M   POC Granulocyte 9.9 (A) 2 - 6.9   Granulocyte percent 71.0 37 - 80 %G   RBC 5.28 4.04 - 5.48 M/uL   Hemoglobin 15.5 12.2 - 16.2 g/dL   HCT, POC 45.4 37.7 - 47.9 %   MCV 86.0 80 - 97 fL   MCH, POC 29.3 27 - 31.2 pg   MCHC 34.1 31.8 - 35.4 g/dL   RDW, POC 13.7 %   Platelet Count, POC 308 142 - 424 K/uL   MPV 6.3 0 - 99.8 fL    Assessment and Plan :  This case was precepted with Dr. Nolon Rod.  1. Generalized body aches - POCT Influenza A/B - POCT CBC 2. Acute midline thoracic back pain 3. Neck pain 4. Nonintractable headache, unspecified chronicity pattern, unspecified headache type 5. Lethargy 6. Leukocytosis, unspecified type Although patient's vitals are stable, she has sudden onset exquisite midline back pain, which is exacerbated  with flexing her neck forward and appears lethargic in office. No acute findings on neuro exam. .No muscular pain noted on exam.  She is also complaining of fever, generalized body aches, and headache.  She is afebrile but did take ibuprofen this morning. Point-of-care flu negative.  WBC of 14.  This case was discussed with Dr. Nolon Rod and we both agreed that patient warrants further emergent evaluation by the ED as life threatening conditions such as meningitis and spinal epidural abscess cannot be ruled out with patient PE findings and symptom set.  EMS was contacted and she was taken by EMS to the ED for further emergent evaluation.  Tenna Delaine, PA-C  [facility] 08/10/2017 1:49 PM

## 2017-08-10 NOTE — Discharge Instructions (Signed)
Take ibuprofen for pain and fever.  Take Robaxin for muscle spasms.  Rest.  Drink plenty of fluids.  Follow-up with a family doctor for recheck in 2 days.  Return if worsening symptoms.

## 2017-08-10 NOTE — ED Provider Notes (Signed)
[facility] DEPT Provider Note   CSN: [id number] Arrival date & time: 08/10/17  1055     History   Chief Complaint Chief Complaint  Patient presents with  . Fatigue    HPI Jennifer [name] is a 52 y.o. female.  HPI Jennifer [name] is a 52 y.o. female with presents to emergency department complaining of generalized weakness, cough, chills, back and neck pain.  Patient states all of her symptoms started this morning when she woke up.  She states that she is having pain in her neck whenever she is holding her head up that radiates all the way down to the lower back.  She denies any fever, but states she is having chills.  She reports cough.  She reports abdominal pain that is diffuse.  She reports nausea, burping, denies vomiting.  Denies any urinary symptoms.  Denies any diarrhea.  Patient went to [facility] today and was sent here with concern of possible meningitis.  Past Medical History:  Diagnosis Date  . Allergy   . Cancer ([place])    basal cell  . Depression   . Diverticulosis   . Fundic gland polyps of stomach, benign   . kidney stone   . Low HDL (under 40)   . Personal history of adenomatous colonic polyps 05/08/2012  . Rosacea   . Tenosynovitis, de Quervain   . Urosepsis 10/2011   after stent    Patient Active Problem List   Diagnosis Date Noted  . Insomnia 02/18/2016  . GAD (generalized anxiety disorder) 02/18/2016  . Severe episode of recurrent major depressive disorder, without psychotic features ([place]) 02/18/2016  . Depression with anxiety 10/15/2014  . Personal history of adenomatous colonic polyps 05/08/2012  . De Quervain's tenosynovitis 04/30/2012  . MENORRHAGIA 03/05/2008  . SEBORRHEIC KERATOSIS, INFLAMED 11/07/2007  . LOW HDL 02/28/2007  . OBESITY 02/28/2007  . SMOKER 02/21/2007  . DEPRESSION 02/21/2007  . ECZEMA 02/21/2007  . ROSACEA 02/21/2007    Past Surgical History:  Procedure Laterality Date  .  CERVICAL BIOPSY  W/ LOOP ELECTRODE EXCISION  04/2003  . CYSTOSCOPY W/ URETERAL STENT PLACEMENT  10/14/2011   Procedure: CYSTOSCOPY WITH RETROGRADE PYELOGRAM/URETERAL STENT PLACEMENT;  Surgeon: Hanley Ben, MD;  Location: [facility];  Service: Urology;  Laterality: Right;  Right Double J Stent  . MULTIPLE TOOTH EXTRACTIONS    . OOPHORECTOMY     left ovary  . TUBAL LIGATION    . UMBILICAL HERNIA REPAIR    . UPPER GASTROINTESTINAL ENDOSCOPY  2008  . VAGINAL HYSTERECTOMY    . WISDOM TOOTH EXTRACTION      OB History    Gravida Para Term Preterm AB Living   2 2       2    SAB TAB Ectopic Multiple Live Births                   Home Medications    Prior to Admission medications   Medication Sig Start Date End Date Taking? Authorizing Provider  ibuprofen (ADVIL,MOTRIN) 200 MG tablet Take 200 mg by mouth daily.    [provider]  lithium carbonate (ESKALITH) 450 MG CR tablet Take 1 tablet (450 mg total) by mouth daily. 07/13/17   [name], MD  PARoxetine (PAXIL) 40 MG tablet Take 1.5 tablets (60 mg total) by mouth every morning. 07/13/17   [name], MD  promethazine (PHENERGAN) 25 MG tablet Take 25 mg by mouth every 6 (six) hours as needed.  Reported on 09/16/2015    [provider]  traZODone (DESYREL) 50 MG tablet TAKE ONE-HALF TO ONE TABLET BY MOUTH AT BEDTIME AS NEEDED FOR SLEEP 07/13/17   [name], MD    Family History Family History  Problem Relation Age of Onset  . Nephrolithiasis Mother   . Skin cancer Mother        from radiation tx  . Cancer Mother        ? type  . Irritable bowel syndrome Unknown   . Fibroids Sister   . Alcohol abuse Paternal Grandmother   . Suicidality Paternal Aunt   . Depression Cousin   . Depression Paternal Aunt   . Anesthesia problems Neg Hx   . Hypotension Neg Hx   . Malignant hyperthermia Neg Hx   . Pseudochol deficiency Neg Hx     Social History Social History   Tobacco Use  . Smoking status: Current  Every Day Smoker    Packs/day: 0.50    Years: 30.00    Pack years: 15.00    Types: Cigarettes  . Smokeless tobacco: Never Used  Substance Use Topics  . Alcohol use: No  . Drug use: No     Allergies   Nitrofurantoin monohyd macro and Sulfa antibiotics   Review of Systems Review of Systems  Constitutional: Positive for chills. Negative for fever.  HENT: Negative for congestion and sore throat.   Respiratory: Positive for cough and shortness of breath. Negative for chest tightness.   Cardiovascular: Negative for chest pain, palpitations and leg swelling.  Gastrointestinal: Positive for nausea. Negative for abdominal pain, diarrhea and vomiting.  Genitourinary: Negative for dysuria, flank pain, pelvic pain, vaginal bleeding, vaginal discharge and vaginal pain.  Musculoskeletal: Positive for arthralgias, back pain, neck pain and neck stiffness. Negative for myalgias.  Skin: Negative for rash.  Neurological: Positive for headaches. Negative for dizziness and weakness.  All other systems reviewed and are negative.    Physical Exam Updated Vital Signs BP (!) 118/98 (BP Location: Left Arm)   Pulse 73   Temp 97.9 F (36.6 C) (Oral)   Resp 18   SpO2 94%   Physical Exam  Constitutional: She is oriented to person, place, and time. She appears well-developed and well-nourished. No distress.  HENT:  Head: Normocephalic.  Oropharynx normal  Eyes: Conjunctivae and EOM are normal. Pupils are equal, round, and reactive to light.  Neck: Neck supple.  Midline cervical spine tenderness.  Full range of motion of the neck.  Pain when touching her chin to the chest  Cardiovascular: Normal rate, regular rhythm and normal heart sounds.  Pulmonary/Chest: Effort normal and breath sounds normal. No respiratory distress. She has no wheezes. She has no rales.  Abdominal: Soft. Bowel sounds are normal. She exhibits no distension. There is no tenderness. There is no rebound.  Musculoskeletal: She  exhibits no edema.  Pain with bilateral straight leg raise and lower back.  There is diffuse tenderness over the back, mainly over the midline thoracic and lumbar spine.  Neurological: She is alert and oriented to person, place, and time.  Skin: Skin is warm and dry.  Psychiatric: She has a normal mood and affect. Her behavior is normal.  Nursing note and vitals reviewed.    ED Treatments / Results  Labs (all labs ordered are listed, but only abnormal results are displayed) Labs Reviewed  BASIC METABOLIC PANEL - Abnormal; Notable for the following components:      Result Value   Calcium 8.5 (*)  All other components within normal limits  CBC - Abnormal; Notable for the following components:   WBC 14.1 (*)    RBC 5.21 (*)    Hemoglobin 15.3 (*)    All other components within normal limits  LITHIUM LEVEL - Abnormal; Notable for the following components:   Lithium Lvl 0.35 (*)    All other components within normal limits  CULTURE, BLOOD (ROUTINE X 2)  CULTURE, BLOOD (ROUTINE X 2)  URINALYSIS, ROUTINE W REFLEX MICROSCOPIC  HEPATIC FUNCTION PANEL  LIPASE, BLOOD  CK  I-STAT CG4 LACTIC ACID, ED    EKG  EKG Interpretation None       Radiology Dg Chest 2 View  Result Date: 08/10/2017 CLINICAL DATA:  Cough, chills and nausea today. EXAM: CHEST  2 VIEW COMPARISON:  None FINDINGS: The cardiomediastinal silhouette is unremarkable. Peribronchial thickening noted. There is no evidence of focal airspace disease, pulmonary edema, suspicious pulmonary nodule/mass, pleural effusion, or pneumothorax. No acute bony abnormalities are identified. IMPRESSION: Peribronchial thickening without evidence of focal pneumonia. This is of uncertain chronicity but may represent an acute bronchitis given history. Electronically Signed   By: Margarette Canada M.D.   On: 08/10/2017 14:45    Procedures Procedures (including critical care time)  Medications Ordered in ED Medications  sodium chloride 0.9 %  bolus 1,000 mL (not administered)  ondansetron (ZOFRAN) injection 4 mg (not administered)     Initial Impression / Assessment and Plan / ED Course  I have reviewed the triage vital signs and the nursing notes.  Pertinent labs & imaging results that were available during my care of the patient were reviewed by me and considered in my medical decision making (see chart for details).    Patient with generalized weakness, cough, nausea, diffuse abdominal pain, onset this morning.  She is afebrile, normal vital signs.  She was sent from [facility] with concern of possible meningitis due to neck and back pain.  Patient has no nuchal rigidity, although she does have pain with movement of her head specifically when she flexes it forward.  Her back exam is diffusely tender on palpation, given normal vital signs, normal temperature, doubt bacterial meningitis at this time.  I will get labs, administer IV fluids, antiemetics, and reassess.  2:54 PM Labs unremarkable other than slightly elevated white blood cell count and hemoglobin.  I suspect may be dehydration as the cause.  She received IV fluids.  Urine unremarkable.  Lactic acid is normal.  CK normal.  Electrolytes and renal function normal.  I have very low suspicious for meningitis, patient states she feels better and is moving her neck freely with no pain.  She also states her back pain has resolved.  She states she just feels generally weak and feels like she is coming down with something.  We discussed doing LP as the next step in evaluation to rule out meningitis. Decided that since she is feeling better, normal VS, afebrile, will hold off on LP. Home with muscle relaxants, NSAIDs, rest, fluids.  Follow-up with her family doctor in 2 days.  Return precautions discussed.  Vitals:   08/10/17 1111 08/10/17 1200 08/10/17 1415  BP: (!) 118/98 127/69 119/77  Pulse: 73 64 71  Resp: 18 16 20   Temp: 97.9 F (36.6 C)    TempSrc: Oral      SpO2: 94% 94% 93%    Final Clinical Impressions(s) / ED Diagnoses   Final diagnoses:  Generalized weakness  Dehydration  Acute midline  back pain, unspecified back location    ED Discharge Orders        Ordered    methocarbamol (ROBAXIN) 500 MG tablet  2 times daily     08/10/17 1458       Jeannett Senior, PA-C 08/10/17 1459    Duffy Bruce, MD 08/10/17 [phone]

## 2017-08-10 NOTE — ED Triage Notes (Signed)
Transported by [facility] from [facility] Care at [facility] for rule out meningitis. Sudden onset of weakness, fatigue, body aches, nausea, and neck pain that started last night. Pt reports fever at home (unchecked).

## 2017-08-10 NOTE — ED Notes (Signed)
ED Provider at bedside. 

## 2017-08-15 LAB — CULTURE, BLOOD (ROUTINE X 2)
Culture: NO GROWTH
Culture: NO GROWTH
SPECIAL REQUESTS: ADEQUATE
Special Requests: ADEQUATE

## 2017-10-06 ENCOUNTER — Other Ambulatory Visit: Payer: Self-pay

## 2017-10-06 ENCOUNTER — Ambulatory Visit (AMBULATORY_SURGERY_CENTER): Payer: Self-pay | Admitting: *Deleted

## 2017-10-06 VITALS — Ht 68.0 in | Wt 225.0 lb

## 2017-10-06 DIAGNOSIS — Z86010 Personal history of colonic polyps: Secondary | ICD-10-CM

## 2017-10-06 NOTE — Progress Notes (Signed)
No egg or soy allergy known to patient  No issues with past sedation with any surgeries  or procedures, no intubation problems  No diet pills per patient No home 02 use per patient  No blood thinners per patient  Pt denies issues with constipation  No A fib or A flutter  EMMI video sent to pt's e mail pt declined   

## 2017-10-09 ENCOUNTER — Other Ambulatory Visit (HOSPITAL_COMMUNITY): Payer: Self-pay

## 2017-10-09 DIAGNOSIS — F411 Generalized anxiety disorder: Secondary | ICD-10-CM

## 2017-10-09 DIAGNOSIS — F332 Major depressive disorder, recurrent severe without psychotic features: Secondary | ICD-10-CM

## 2017-10-09 MED ORDER — LITHIUM CARBONATE ER 450 MG PO TBCR: 450.0000 mg | EXTENDED_RELEASE_TABLET | Freq: Every day | ORAL | 0 refills | Status: DC

## 2017-10-09 MED ORDER — PAROXETINE HCL 40 MG PO TABS: 60.0000 mg | ORAL_TABLET | ORAL | 0 refills | Status: DC

## 2017-10-16 ENCOUNTER — Other Ambulatory Visit (HOSPITAL_COMMUNITY): Payer: Self-pay

## 2017-10-16 NOTE — Telephone Encounter (Signed)
Received refill request from pharmacy for Trazodone 50mg  tabs. Last refill was 07/13/17. Next appointment is 11/09/17. Please advise

## 2017-10-16 NOTE — Telephone Encounter (Signed)
I spoke with patient and she does not need the Trazodone at this time.

## 2017-10-19 ENCOUNTER — Other Ambulatory Visit (HOSPITAL_COMMUNITY): Payer: Self-pay

## 2017-10-19 DIAGNOSIS — F99 Mental disorder, not otherwise specified: Principal | ICD-10-CM

## 2017-10-19 DIAGNOSIS — F5105 Insomnia due to other mental disorder: Secondary | ICD-10-CM

## 2017-10-19 MED ORDER — TRAZODONE HCL 50 MG PO TABS: ORAL_TABLET | ORAL | 0 refills | Status: DC

## 2017-10-20 ENCOUNTER — Ambulatory Visit (AMBULATORY_SURGERY_CENTER): Payer: Managed Care, Other (non HMO) | Admitting: Internal Medicine

## 2017-10-20 ENCOUNTER — Encounter: Payer: Self-pay | Admitting: Internal Medicine

## 2017-10-20 VITALS — BP 116/65 | HR 61 | Temp 96.2°F | Resp 14 | Ht 67.0 in | Wt 220.0 lb

## 2017-10-20 DIAGNOSIS — D128 Benign neoplasm of rectum: Secondary | ICD-10-CM

## 2017-10-20 DIAGNOSIS — D129 Benign neoplasm of anus and anal canal: Secondary | ICD-10-CM

## 2017-10-20 DIAGNOSIS — K635 Polyp of colon: Secondary | ICD-10-CM | POA: Diagnosis not present

## 2017-10-20 DIAGNOSIS — Z86010 Personal history of colonic polyps: Secondary | ICD-10-CM

## 2017-10-20 DIAGNOSIS — D120 Benign neoplasm of cecum: Secondary | ICD-10-CM | POA: Diagnosis not present

## 2017-10-20 MED ORDER — SODIUM CHLORIDE 0.9 % IV SOLN: 500.0000 mL | Freq: Once | INTRAVENOUS | Status: DC

## 2017-10-20 NOTE — Progress Notes (Signed)
To recovery, report to RN, VSS. 

## 2017-10-20 NOTE — Progress Notes (Signed)
Pt's states no medical or surgical changes since previsit or office visit. 

## 2017-10-20 NOTE — Patient Instructions (Addendum)
   I found and removed 2 small polyps.  No signs of cancer.  I will let you know pathology results and when to have another routine colonoscopy by mail and/or My Chart.  I appreciate the opportunity to care for you. Gatha Mayer, MD, FACG YOU HAD AN ENDOSCOPIC PROCEDURE TODAY AT [facility]:   Refer to the procedure report that was given to you for any specific questions about what was found during the examination.  If the procedure report does not answer your questions, please call your gastroenterologist to clarify.  If you requested that your care partner not be given the details of your procedure findings, then the procedure report has been included in a sealed envelope for you to review at your convenience later.  YOU SHOULD EXPECT: Some feelings of bloating in the abdomen. Passage of more gas than usual.  Walking can help get rid of the air that was put into your GI tract during the procedure and reduce the bloating. If you had a lower endoscopy (such as a colonoscopy or flexible sigmoidoscopy) you may notice spotting of blood in your stool or on the toilet paper. If you underwent a bowel prep for your procedure, you may not have a normal bowel movement for a few days.  Please Note:  You might notice some irritation and congestion in your nose or some drainage.  This is from the oxygen used during your procedure.  There is no need for concern and it should clear up in a day or so.  SYMPTOMS TO REPORT IMMEDIATELY:   Following lower endoscopy (colonoscopy or flexible sigmoidoscopy):  Excessive amounts of blood in the stool  Significant tenderness or worsening of abdominal pains  Swelling of the abdomen that is new, acute  Fever of 100F or higher   For urgent or emergent issues, a gastroenterologist can be reached at any hour by calling [phone].   DIET:  We do recommend a small meal at first, but then you may proceed to your regular diet.  Drink plenty  of fluids but you should avoid alcoholic beverages for 24 hours.  ACTIVITY:  You should plan to take it easy for the rest of today and you should NOT DRIVE or use heavy machinery until tomorrow (because of the sedation medicines used during the test).    FOLLOW UP: Our staff will call the number listed on your records the next business day following your procedure to check on you and address any questions or concerns that you may have regarding the information given to you following your procedure. If we do not reach you, we will leave a message.  However, if you are feeling well and you are not experiencing any problems, there is no need to return our call.  We will assume that you have returned to your regular daily activities without incident.  If any biopsies were taken you will be contacted by phone or by letter within the next 1-3 weeks.  Please call us at [phone] if you have not heard about the biopsies in 3 weeks.    SIGNATURES/CONFIDENTIALITY: You and/or your care partner have signed paperwork which will be entered into your electronic medical record.  These signatures attest to the fact that that the information above on your After Visit Summary has been reviewed and is understood.  Full responsibility of the confidentiality of this discharge information lies with you and/or your care-partner.  Polyp information given.

## 2017-10-20 NOTE — Op Note (Signed)
[facility] Patient Name: Jennifer [name] Procedure Date: 10/20/2017 1:16 PM MRN: [id number] Endoscopist: Gatha Mayer , MD Age: 52 Referring MD:  Date of Birth: 10-12-1965 Gender: Female Account #: [id number] Procedure:                Colonoscopy Indications:              Surveillance: Personal history of adenomatous                            polyps on last colonoscopy > 5 years ago Medicines:                Propofol per Anesthesia, Monitored Anesthesia Care Procedure:                Pre-Anesthesia Assessment:                           - Prior to the procedure, a History and Physical                            was performed, and patient medications and                            allergies were reviewed. The patient's tolerance of                            previous anesthesia was also reviewed. The risks                            and benefits of the procedure and the sedation                            options and risks were discussed with the patient.                            All questions were answered, and informed consent                            was obtained. Prior Anticoagulants: The patient has                            taken no previous anticoagulant or antiplatelet                            agents. ASA Grade Assessment: II - A patient with                            mild systemic disease. After reviewing the risks                            and benefits, the patient was deemed in                            satisfactory condition to undergo the procedure.  After obtaining informed consent, the colonoscope                            was passed under direct vision. Throughout the                            procedure, the patient's blood pressure, pulse, and                            oxygen saturations were monitored continuously. The                            Colonoscope was introduced through the anus and                             advanced to the the cecum, identified by                            appendiceal orifice and ileocecal valve. The                            colonoscopy was performed without difficulty. The                            patient tolerated the procedure well. The quality                            of the bowel preparation was good. The ileocecal                            valve, appendiceal orifice, and rectum were                            photographed. The bowel preparation used was                            Miralax. Scope In: 1:39:37 PM Scope Out: 1:57:25 PM Scope Withdrawal Time: 0 hours 13 minutes 29 seconds  Total Procedure Duration: 0 hours 17 minutes 48 seconds  Findings:                 The perianal and digital rectal examinations were                            normal.                           Two sessile polyps were found in the rectum and                            ileocecal valve. The polyps were diminutive in                            size. These polyps were removed with a cold snare.  Resection and retrieval were complete. Verification                            of patient identification for the specimen was                            done. Estimated blood loss was minimal.                           The exam was otherwise without abnormality on                            direct and retroflexion views. Complications:            No immediate complications. Estimated Blood Loss:     Estimated blood loss was minimal. Impression:               - Two diminutive polyps in the rectum and at the                            ileocecal valve, removed with a cold snare.                            Resected and retrieved.                           - The examination was otherwise normal on direct                            and retroflexion views.                           - Personal history of colonic polyps. 2 adenomas                             2013 Recommendation:           - Patient has a contact number available for                            emergencies. The signs and symptoms of potential                            delayed complications were discussed with the                            patient. Return to normal activities tomorrow.                            Written discharge instructions were provided to the                            patient.                           - Resume previous diet.                           -  Continue present medications.                           - Repeat colonoscopy is recommended for                            surveillance. The colonoscopy date will be                            determined after pathology results from today's                            exam become available for review. Gatha Mayer, MD 10/20/2017 2:03:41 PM This report has been signed electronically.

## 2017-10-20 NOTE — Progress Notes (Signed)
Called to room to assist during endoscopic procedure.  Patient ID and intended procedure confirmed with present staff. Received instructions for my participation in the procedure from the performing physician.  

## 2017-10-23 ENCOUNTER — Telehealth: Payer: Self-pay | Admitting: *Deleted

## 2017-10-23 NOTE — Telephone Encounter (Signed)
  Follow up Call-  Call back number 10/20/2017  Post procedure Call Back phone  # [phone]  Permission to leave phone message Yes  Some recent data might be hidden     Patient questions:  Do you have a fever, pain , or abdominal swelling? No. Pain Score  0 *  Have you tolerated food without any problems? Yes.    Have you been able to return to your normal activities? No patient reports just still "being tired after the procedure with some abdominal tenderness no pain"  Do you have any questions about your discharge instructions: Diet   No. Medications  No. Follow up visit  No.  Do you have questions or concerns about your Care? No.  Actions: * If pain score is 4 or above: No action needed, pain <4.

## 2017-10-23 NOTE — Telephone Encounter (Signed)
No answer for post procedure call back. Will attempt to call back later this afternoon. SM 

## 2017-10-28 ENCOUNTER — Encounter: Payer: Self-pay | Admitting: Internal Medicine

## 2017-10-28 DIAGNOSIS — Z86010 Personal history of colonic polyps: Secondary | ICD-10-CM

## 2017-11-09 ENCOUNTER — Ambulatory Visit (INDEPENDENT_AMBULATORY_CARE_PROVIDER_SITE_OTHER): Payer: 59 | Admitting: Psychiatry

## 2017-11-09 ENCOUNTER — Encounter (HOSPITAL_COMMUNITY): Payer: Self-pay | Admitting: Psychiatry

## 2017-11-09 DIAGNOSIS — F5105 Insomnia due to other mental disorder: Secondary | ICD-10-CM | POA: Diagnosis not present

## 2017-11-09 DIAGNOSIS — F332 Major depressive disorder, recurrent severe without psychotic features: Secondary | ICD-10-CM | POA: Diagnosis not present

## 2017-11-09 DIAGNOSIS — F411 Generalized anxiety disorder: Secondary | ICD-10-CM

## 2017-11-09 DIAGNOSIS — Z811 Family history of alcohol abuse and dependence: Secondary | ICD-10-CM | POA: Diagnosis not present

## 2017-11-09 DIAGNOSIS — F17210 Nicotine dependence, cigarettes, uncomplicated: Secondary | ICD-10-CM

## 2017-11-09 DIAGNOSIS — Z818 Family history of other mental and behavioral disorders: Secondary | ICD-10-CM

## 2017-11-09 DIAGNOSIS — F99 Mental disorder, not otherwise specified: Secondary | ICD-10-CM

## 2017-11-09 MED ORDER — PAROXETINE HCL 40 MG PO TABS: 60.0000 mg | ORAL_TABLET | ORAL | 4 refills | Status: DC

## 2017-11-09 MED ORDER — LITHIUM CARBONATE ER 450 MG PO TBCR: 450.0000 mg | EXTENDED_RELEASE_TABLET | Freq: Every day | ORAL | 4 refills | Status: DC

## 2017-11-09 MED ORDER — TRAZODONE HCL 50 MG PO TABS: ORAL_TABLET | ORAL | 0 refills | Status: DC
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
## 2018-02-14 ENCOUNTER — Other Ambulatory Visit (HOSPITAL_COMMUNITY): Payer: Self-pay

## 2018-02-14 DIAGNOSIS — F5105 Insomnia due to other mental disorder: Secondary | ICD-10-CM

## 2018-02-14 DIAGNOSIS — F99 Mental disorder, not otherwise specified: Principal | ICD-10-CM

## 2018-02-14 MED ORDER — TRAZODONE HCL 50 MG PO TABS: ORAL_TABLET | ORAL | 0 refills | Status: DC

## 2018-03-15 ENCOUNTER — Ambulatory Visit (HOSPITAL_COMMUNITY): Payer: 59 | Admitting: Psychiatry

## 2018-03-15 ENCOUNTER — Encounter (HOSPITAL_COMMUNITY): Payer: Self-pay | Admitting: Psychiatry

## 2018-03-15 DIAGNOSIS — F99 Mental disorder, not otherwise specified: Secondary | ICD-10-CM | POA: Diagnosis not present

## 2018-03-15 DIAGNOSIS — F5105 Insomnia due to other mental disorder: Secondary | ICD-10-CM

## 2018-03-15 DIAGNOSIS — F332 Major depressive disorder, recurrent severe without psychotic features: Secondary | ICD-10-CM

## 2018-03-15 DIAGNOSIS — F411 Generalized anxiety disorder: Secondary | ICD-10-CM | POA: Diagnosis not present

## 2018-03-15 DIAGNOSIS — Z818 Family history of other mental and behavioral disorders: Secondary | ICD-10-CM

## 2018-03-15 DIAGNOSIS — Z811 Family history of alcohol abuse and dependence: Secondary | ICD-10-CM

## 2018-03-15 MED ORDER — LITHIUM CARBONATE ER 450 MG PO TBCR: 450.0000 mg | EXTENDED_RELEASE_TABLET | Freq: Every day | ORAL | 0 refills | Status: DC

## 2018-03-15 MED ORDER — TRAZODONE HCL 50 MG PO TABS: ORAL_TABLET | ORAL | 0 refills | Status: DC

## 2018-03-15 MED ORDER — PAROXETINE HCL 40 MG PO TABS: 60.0000 mg | ORAL_TABLET | ORAL | 0 refills | Status: DC
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
## 2018-06-21 ENCOUNTER — Ambulatory Visit (INDEPENDENT_AMBULATORY_CARE_PROVIDER_SITE_OTHER): Payer: 59 | Admitting: Psychiatry

## 2018-06-21 ENCOUNTER — Encounter (HOSPITAL_COMMUNITY): Payer: Self-pay | Admitting: Psychiatry

## 2018-06-21 DIAGNOSIS — F332 Major depressive disorder, recurrent severe without psychotic features: Secondary | ICD-10-CM | POA: Diagnosis not present

## 2018-06-21 DIAGNOSIS — F99 Mental disorder, not otherwise specified: Secondary | ICD-10-CM

## 2018-06-21 DIAGNOSIS — F411 Generalized anxiety disorder: Secondary | ICD-10-CM | POA: Diagnosis not present

## 2018-06-21 DIAGNOSIS — F5105 Insomnia due to other mental disorder: Secondary | ICD-10-CM | POA: Diagnosis not present

## 2018-06-21 MED ORDER — TRAZODONE HCL 50 MG PO TABS: ORAL_TABLET | ORAL | 0 refills | Status: DC

## 2018-06-21 MED ORDER — LITHIUM CARBONATE ER 450 MG PO TBCR: 450.0000 mg | EXTENDED_RELEASE_TABLET | Freq: Every day | ORAL | 0 refills | Status: DC

## 2018-06-21 MED ORDER — PAROXETINE HCL 40 MG PO TABS: 60.0000 mg | ORAL_TABLET | ORAL | 0 refills | Status: DC
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
## 2018-09-20 ENCOUNTER — Encounter (HOSPITAL_COMMUNITY): Payer: Self-pay | Admitting: Psychiatry

## 2018-09-20 ENCOUNTER — Ambulatory Visit (HOSPITAL_COMMUNITY): Payer: 59 | Admitting: Psychiatry

## 2018-09-20 VITALS — BP 125/71 | HR 84 | Ht 68.0 in | Wt 218.0 lb

## 2018-09-20 DIAGNOSIS — F99 Mental disorder, not otherwise specified: Secondary | ICD-10-CM

## 2018-09-20 DIAGNOSIS — Z716 Tobacco abuse counseling: Secondary | ICD-10-CM

## 2018-09-20 DIAGNOSIS — F5105 Insomnia due to other mental disorder: Secondary | ICD-10-CM

## 2018-09-20 DIAGNOSIS — F411 Generalized anxiety disorder: Secondary | ICD-10-CM

## 2018-09-20 DIAGNOSIS — F332 Major depressive disorder, recurrent severe without psychotic features: Secondary | ICD-10-CM

## 2018-09-20 MED ORDER — LITHIUM CARBONATE ER 450 MG PO TBCR: 450.0000 mg | EXTENDED_RELEASE_TABLET | Freq: Every day | ORAL | 0 refills | Status: DC

## 2018-09-20 MED ORDER — NICOTINE 7 MG/24HR TD PT24: 7.0000 mg | MEDICATED_PATCH | Freq: Every day | TRANSDERMAL | 0 refills | Status: DC

## 2018-09-20 MED ORDER — PAROXETINE HCL 40 MG PO TABS: 60.0000 mg | ORAL_TABLET | ORAL | 0 refills | Status: DC
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
## 2018-11-21 ENCOUNTER — Telehealth (HOSPITAL_COMMUNITY): Payer: Self-pay

## 2018-11-21 NOTE — Telephone Encounter (Signed)
Patient is calling, she wants you to know that she is doing okay and has not missed any work, however she is extremely anxious and she said she had some leftover Lorazepam that she has taken a couple of times. She would like to know if you will provide a prn prescription. She says she does not need everyday, just during times of great stress.

## 2018-12-20 ENCOUNTER — Other Ambulatory Visit: Payer: Self-pay

## 2018-12-20 ENCOUNTER — Ambulatory Visit (INDEPENDENT_AMBULATORY_CARE_PROVIDER_SITE_OTHER): Payer: 59 | Admitting: Psychiatry

## 2018-12-20 ENCOUNTER — Encounter (HOSPITAL_COMMUNITY): Payer: Self-pay | Admitting: Psychiatry

## 2018-12-20 DIAGNOSIS — F5105 Insomnia due to other mental disorder: Secondary | ICD-10-CM

## 2018-12-20 DIAGNOSIS — F99 Mental disorder, not otherwise specified: Secondary | ICD-10-CM

## 2018-12-20 DIAGNOSIS — F411 Generalized anxiety disorder: Secondary | ICD-10-CM | POA: Diagnosis not present

## 2018-12-20 DIAGNOSIS — F332 Major depressive disorder, recurrent severe without psychotic features: Secondary | ICD-10-CM | POA: Diagnosis not present

## 2018-12-20 MED ORDER — PAROXETINE HCL 40 MG PO TABS: 60.0000 mg | ORAL_TABLET | ORAL | 0 refills | Status: DC

## 2018-12-20 MED ORDER — LITHIUM CARBONATE ER 450 MG PO TBCR: 450.0000 mg | EXTENDED_RELEASE_TABLET | Freq: Every day | ORAL | 0 refills | Status: DC

## 2018-12-20 MED ORDER — TRAZODONE HCL 50 MG PO TABS: ORAL_TABLET | ORAL | 0 refills | Status: DC
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
## 2019-02-19 ENCOUNTER — Other Ambulatory Visit (HOSPITAL_COMMUNITY): Payer: Self-pay | Admitting: Psychiatry

## 2019-02-19 ENCOUNTER — Encounter (HOSPITAL_COMMUNITY): Payer: Self-pay | Admitting: Psychiatry

## 2019-02-19 DIAGNOSIS — F411 Generalized anxiety disorder: Secondary | ICD-10-CM

## 2019-02-19 MED ORDER — CLONAZEPAM 0.5 MG PO TABS: 0.5000 mg | ORAL_TABLET | Freq: Two times a day (BID) | ORAL | 0 refills | Status: DC | PRN

## 2019-02-19 NOTE — Progress Notes (Signed)
Patient showed up at the [facility] outpatient office reporting severe anxiety.  I was called by my CMA.  I called the patient who reports that she is extremely anxious and overwhelmed.  "I am not feeling well.  I cannot pinpoint it.  Everything is caving in.  My mind will not stop.".  Patient believes that it is a combination of multiple stressors including COVID and the changes that it has brought, missing her husband, the end of her recent relationship and being lonely.  She states that she is been thinking about her husband a lot.  Her brother-in-law called and left a message on Thursday.  She has been unable to speak with them.  She is reporting racing thoughts, dry heaving, uncontrollable crying, shaking and poor appetite.  She states that she is not sleeping well even with 25 mg of trazodone.  Last night she tried 50 mg and states that it was not helpful.  She really wants to sleep.  She states "nothing hurts me in my sleep".  While she is feeling sad she denies any hopelessness and denies SI/HI.  She did state that she would not mind if she died.  She went to work but had to leave early yesterday.  She is on her way into work right now.  She states usually the medications Paxil, lithium and trazodone are very effective but for the last several days they have not been.  In the past she has taken Xanax which has helped to "slow me down".  Patient is attempting to set up a follow-up appointment with her therapist.  She has an appointment with me on Thursday.  I will prescribe Klonopin 0.5 mg twice daily as needed as a short-term medication for anxiety.

## 2019-02-21 ENCOUNTER — Ambulatory Visit (INDEPENDENT_AMBULATORY_CARE_PROVIDER_SITE_OTHER): Payer: 59 | Admitting: Psychiatry

## 2019-02-21 ENCOUNTER — Other Ambulatory Visit: Payer: Self-pay

## 2019-02-21 ENCOUNTER — Encounter (HOSPITAL_COMMUNITY): Payer: Self-pay | Admitting: Psychiatry

## 2019-02-21 DIAGNOSIS — F332 Major depressive disorder, recurrent severe without psychotic features: Secondary | ICD-10-CM

## 2019-02-21 DIAGNOSIS — F411 Generalized anxiety disorder: Secondary | ICD-10-CM

## 2019-02-21 DIAGNOSIS — G4700 Insomnia, unspecified: Secondary | ICD-10-CM

## 2019-02-21 DIAGNOSIS — F5105 Insomnia due to other mental disorder: Secondary | ICD-10-CM

## 2019-02-21 DIAGNOSIS — F99 Mental disorder, not otherwise specified: Secondary | ICD-10-CM

## 2019-02-21 MED ORDER — LITHIUM CARBONATE ER 450 MG PO TBCR: 450.0000 mg | EXTENDED_RELEASE_TABLET | Freq: Every day | ORAL | 0 refills | Status: DC

## 2019-02-21 MED ORDER — CLONAZEPAM 0.5 MG PO TABS: 0.5000 mg | ORAL_TABLET | Freq: Two times a day (BID) | ORAL | 0 refills | Status: DC | PRN

## 2019-02-21 MED ORDER — PAROXETINE HCL 40 MG PO TABS: 60.0000 mg | ORAL_TABLET | ORAL | 0 refills | Status: DC

## 2019-02-21 MED ORDER — TRAZODONE HCL 50 MG PO TABS: ORAL_TABLET | ORAL | 0 refills | Status: DC

## 2019-02-21 NOTE — Progress Notes (Signed)
Virtual Visit via Telephone Note  I connected with Jennifer [name] on 02/21/19 at  3:15 PM EDT by telephone and verified that I am speaking with the correct person using two identifiers.  Location: Patient: home Provider: office   I discussed the limitations, risks, security and privacy concerns of performing an evaluation and management service by telephone and the availability of in person appointments. I also discussed with the patient that there may be a patient responsible charge related to this service. The patient expressed understanding and agreed to proceed.   History of Present Illness: "I thought you forgot about me!. I am doing better than I was 2 days ago.". She feels like she is thinking clearly. She is emotionally and physically exhausted. Jennifer [name] is sleeping better and that has helped a lot. She has only taken one dose of Klonopin today. It makes her sleepy. Her anxiety is better and she is not on as edge as much. She is not crying as much. The end of the month is her anniversary. She believes that played a big part in all her stressors. "It all came down at once". She wishes she didn't have to deal with all this. Pt feels very alone. Pt denies SI/HI.    Observations/Objective: I spoke with Jennifer [name] on the phone.  Pt was calm, pleasant and cooperative.  Pt was engaged in the conversation and answered questions appropriately.  Speech was clear and coherent with normal rate, tone and volume.  Mood is depressed and anxious, affect is congruent but calmer than 2 days ago. Thought processes are coherent, goal oriented and intact.  Thought content is logical.  Pt denies SI/HI.   Pt denies auditory and visual hallucinations and did not appear to be responding to internal stimuli.  Memory and concentration are good.  Fund of knowledge and use of language are average.  Insight and judgment are fair.  I am unable to comment on psychomotor activity, general appearance, hygiene, or eye  contact as I was unable to physically see the patient on the phone.  Vital signs not available since interview conducted virtually.    Assessment and Plan: MDD-recurrent, severe w/o psychotic features; GAD; Insomnia  Paxil 60mg  po qD Trazodone 25mg  po qHS prn insomnia Lithobid 450mg  po qD Klonopin 0.5mg  po BID prn anxiety  Encouraged to follow up with therapy with Almyra Free on July 30 and EAP in Aug  Follow Up Instructions: In 3-4 weeks or sooner if needed   I discussed the assessment and treatment plan with the patient. The patient was provided an opportunity to ask questions and all were answered. The patient agreed with the plan and demonstrated an understanding of the instructions.   The patient was advised to call back or seek an in-person evaluation if the symptoms worsen or if the condition fails to improve as anticipated.  I provided 20 minutes of non-face-to-face time during this encounter.   [name], MD

## 2019-03-07 ENCOUNTER — Ambulatory Visit (INDEPENDENT_AMBULATORY_CARE_PROVIDER_SITE_OTHER): Payer: 59 | Admitting: Licensed Clinical Social Worker

## 2019-03-07 DIAGNOSIS — F331 Major depressive disorder, recurrent, moderate: Secondary | ICD-10-CM

## 2019-03-14 ENCOUNTER — Encounter (HOSPITAL_COMMUNITY): Payer: Self-pay | Admitting: Psychiatry

## 2019-03-14 ENCOUNTER — Other Ambulatory Visit: Payer: Self-pay

## 2019-03-14 ENCOUNTER — Ambulatory Visit (INDEPENDENT_AMBULATORY_CARE_PROVIDER_SITE_OTHER): Payer: 59 | Admitting: Psychiatry

## 2019-03-14 DIAGNOSIS — F99 Mental disorder, not otherwise specified: Secondary | ICD-10-CM | POA: Diagnosis not present

## 2019-03-14 DIAGNOSIS — F332 Major depressive disorder, recurrent severe without psychotic features: Secondary | ICD-10-CM

## 2019-03-14 DIAGNOSIS — F5105 Insomnia due to other mental disorder: Secondary | ICD-10-CM

## 2019-03-14 DIAGNOSIS — F411 Generalized anxiety disorder: Secondary | ICD-10-CM | POA: Diagnosis not present

## 2019-03-14 MED ORDER — PAROXETINE HCL 40 MG PO TABS: 60.0000 mg | ORAL_TABLET | ORAL | 0 refills | Status: DC

## 2019-03-14 MED ORDER — TRAZODONE HCL 50 MG PO TABS: ORAL_TABLET | ORAL | 0 refills | Status: DC

## 2019-03-14 MED ORDER — CLONAZEPAM 1 MG PO TABS: 1.0000 mg | ORAL_TABLET | Freq: Two times a day (BID) | ORAL | 1 refills | Status: DC | PRN

## 2019-03-14 MED ORDER — LITHIUM CARBONATE ER 300 MG PO TBCR: 600.0000 mg | EXTENDED_RELEASE_TABLET | Freq: Every day | ORAL | 0 refills | Status: DC

## 2019-03-14 NOTE — Progress Notes (Signed)
Virtual Visit via Telephone Note  I connected with Jennifer [name] on 03/14/19 at 12:45 PM EDT by telephone and verified that I am speaking with the correct person using two identifiers.  Location: Patient: home Provider: office   I discussed the limitations, risks, security and privacy concerns of performing an evaluation and management service by telephone and the availability of in person appointments. I also discussed with the patient that there may be a patient responsible charge related to this service. The patient expressed understanding and agreed to proceed.   History of Present Illness: "I am hanging in there". She has cried every day since our last visit. She has a lot of anxiety. She ran out of Klonopin 1 week ago. Her nieces wedding is the same date as her annual beach trip. Pt contacted her sister to let her know that pt would not be able to attend and it did not go well. Pt is having numerous stressors. Pt is having a lot of sadness due to having her favorite aunt being a widow and pt having had gone thru the same in the past. Pt is very anxious. She denies SI/HI. The thought of dying does not bother her though.    Observations/Objective: I spoke with [name] on the phone.  Pt was calm, pleasant and cooperative.  Pt was engaged in the conversation and answered questions appropriately.  Speech was clear and coherent with normal rate, tone and volume.  Mood is depressed and anxious, affect is congruent. Thought processes are coherent, goal oriented and intact.  Thought content is with ruminations.  Pt denies SI/HI.   Pt denies auditory and visual hallucinations and did not appear to be responding to internal stimuli.  Memory and concentration are good.  Fund of knowledge and use of language are average.  Insight and judgment are fair.  I am unable to comment on psychomotor activity, general appearance, hygiene, or eye contact as I was unable to physically see the patient on  the phone.  Vital signs not available since interview conducted virtually.    Assessment and Plan: MDD-recurrent, severe without psychotic features; GAD; Insomnia  Paxil 60mg  po qD Trazodone 25mg  po qHS prn insomnia Increase Lithobid 600mg  po qD Klonopin 0.5mg  po BID prn anxiety   Follow Up Instructions: In 3-4 weeks or sooner if needed   I discussed the assessment and treatment plan with the patient. The patient was provided an opportunity to ask questions and all were answered. The patient agreed with the plan and demonstrated an understanding of the instructions.   The patient was advised to call back or seek an in-person evaluation if the symptoms worsen or if the condition fails to improve as anticipated.  I provided 20 minutes of non-face-to-face time during this encounter.   [name], MD

## 2019-04-03 ENCOUNTER — Ambulatory Visit (INDEPENDENT_AMBULATORY_CARE_PROVIDER_SITE_OTHER): Payer: 59 | Admitting: Licensed Clinical Social Worker

## 2019-04-03 DIAGNOSIS — F3341 Major depressive disorder, recurrent, in partial remission: Secondary | ICD-10-CM

## 2019-04-11 ENCOUNTER — Other Ambulatory Visit: Payer: Self-pay

## 2019-04-11 ENCOUNTER — Encounter (HOSPITAL_COMMUNITY): Payer: Self-pay | Admitting: Psychiatry

## 2019-04-11 ENCOUNTER — Ambulatory Visit (INDEPENDENT_AMBULATORY_CARE_PROVIDER_SITE_OTHER): Payer: 59 | Admitting: Psychiatry

## 2019-04-11 DIAGNOSIS — F5105 Insomnia due to other mental disorder: Secondary | ICD-10-CM

## 2019-04-11 DIAGNOSIS — F332 Major depressive disorder, recurrent severe without psychotic features: Secondary | ICD-10-CM | POA: Diagnosis not present

## 2019-04-11 DIAGNOSIS — F411 Generalized anxiety disorder: Secondary | ICD-10-CM

## 2019-04-11 DIAGNOSIS — F99 Mental disorder, not otherwise specified: Secondary | ICD-10-CM

## 2019-04-11 MED ORDER — LITHIUM CARBONATE ER 300 MG PO TBCR: 600.0000 mg | EXTENDED_RELEASE_TABLET | Freq: Every day | ORAL | 0 refills | Status: DC

## 2019-04-11 MED ORDER — PAROXETINE HCL 40 MG PO TABS: 60.0000 mg | ORAL_TABLET | ORAL | 0 refills | Status: DC

## 2019-04-11 MED ORDER — CLONAZEPAM 1 MG PO TABS: 1.0000 mg | ORAL_TABLET | Freq: Two times a day (BID) | ORAL | 1 refills | Status: DC | PRN

## 2019-04-11 MED ORDER — TRAZODONE HCL 50 MG PO TABS: ORAL_TABLET | ORAL | 0 refills | Status: DC

## 2019-04-11 NOTE — Progress Notes (Signed)
  Virtual Visit via Telephone Note  I connected with Jennifer [name] on 04/11/19 at  1:45 PM EDT by telephone and verified that I am speaking with the correct person using two identifiers.  Location: Patient: home Provider: office   I discussed the limitations, risks, security and privacy concerns of performing an evaluation and management service by telephone and the availability of in person appointments. I also discussed with the patient that there may be a patient responsible charge related to this service. The patient expressed understanding and agreed to proceed.   History of Present Illness: "I feel like I am starting to get better. I am hanging in there". She has been isolating and withdrawn. 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 She has reconnected with an old friend. They have been texting back and forth. He sent her flowers at work today. They have a date next weekend. Her ex-boyfriend has not communicated with her at all since their breakup 2 months ago. The depression has been ongoing. She was crying all the time. Pt has been somewhat active at home and still doing things like going to work. She walks the dogs and visits her parents on Sundays. For the last one week things have started to improve. She is not crying as much. Anam fixed herself 2 meals which a huge improvement. Prior to that she was eating cereal or toast. Lucill feels so sad all time. Pt denies SI/HI. Pt is taking Trazodone most days of the week. She feels all the tired all the time. Her anxiety is ongoing. She takes 1/2 Klonopin at lunch and rarely at bedtime.     Observations/Objective: I spoke with Jennifer [name] on the phone.  Pt was calm, pleasant and cooperative.  Pt was engaged in the conversation and answered questions appropriately.  Speech was clear and coherent with normal rate, tone and volume.  Mood is depressed and anxious, affect  is congruent. Thought processes are coherent, goal oriented and intact.  Thought content is with ruminations.  Pt denies SI/HI.   Pt denies auditory and visual hallucinations and did not appear to be responding to internal stimuli.  Memory and concentration are good.  Fund of knowledge and use of language are average.  Insight and judgment are fair.  I am unable to comment on psychomotor activity, general appearance, hygiene, or eye contact as I was unable to physically see the patient on the phone.  Vital signs not available since interview conducted virtually.     Assessment and Plan: MDD-recurrent, severe without psychotic features; GAD; insomnia  Status of current symptoms: mild improvement in depression  Meds: Paxil 60mg  po qD Trazodone 25mg  po qHS prn insomnia Lithobid 600mg  po qD Klonopin 0.5mg  po BID prn anxiety   Follow Up Instructions: In 6 weeks or sooner if needed   I discussed the assessment and treatment plan with the patient. The patient was provided an opportunity to ask questions and all were answered. The patient agreed with the plan and demonstrated an understanding of the instructions.   The patient was advised to call back or seek an in-person evaluation if the symptoms worsen or if the condition fails to improve as anticipated.  I provided 25 minutes of non-face-to-face time during this encounter.   [name], MD

## 2019-05-02 ENCOUNTER — Ambulatory Visit (INDEPENDENT_AMBULATORY_CARE_PROVIDER_SITE_OTHER): Payer: 59 | Admitting: Licensed Clinical Social Worker

## 2019-05-02 DIAGNOSIS — F3341 Major depressive disorder, recurrent, in partial remission: Secondary | ICD-10-CM | POA: Diagnosis not present

## 2019-05-23 ENCOUNTER — Ambulatory Visit (INDEPENDENT_AMBULATORY_CARE_PROVIDER_SITE_OTHER): Payer: 59 | Admitting: Psychiatry

## 2019-05-23 ENCOUNTER — Encounter (HOSPITAL_COMMUNITY): Payer: Self-pay | Admitting: Psychiatry

## 2019-05-23 ENCOUNTER — Other Ambulatory Visit: Payer: Self-pay

## 2019-05-23 DIAGNOSIS — F5105 Insomnia due to other mental disorder: Secondary | ICD-10-CM | POA: Diagnosis not present

## 2019-05-23 DIAGNOSIS — F332 Major depressive disorder, recurrent severe without psychotic features: Secondary | ICD-10-CM

## 2019-05-23 DIAGNOSIS — F411 Generalized anxiety disorder: Secondary | ICD-10-CM | POA: Diagnosis not present

## 2019-05-23 DIAGNOSIS — F99 Mental disorder, not otherwise specified: Secondary | ICD-10-CM

## 2019-05-23 MED ORDER — TRAZODONE HCL 50 MG PO TABS: ORAL_TABLET | ORAL | 0 refills | Status: DC

## 2019-05-23 MED ORDER — PAROXETINE HCL 40 MG PO TABS: 60.0000 mg | ORAL_TABLET | ORAL | 0 refills | Status: DC

## 2019-05-23 MED ORDER — LITHIUM CARBONATE ER 300 MG PO TBCR: 600.0000 mg | EXTENDED_RELEASE_TABLET | Freq: Every day | ORAL | 0 refills | Status: DC

## 2019-05-23 MED ORDER — CLONAZEPAM 1 MG PO TABS: 1.0000 mg | ORAL_TABLET | Freq: Two times a day (BID) | ORAL | 0 refills | Status: DC | PRN

## 2019-05-23 NOTE — Progress Notes (Signed)
  Virtual Visit via Telephone Note  I connected with Jennifer [name]  on 05/23/19 at  1:30 PM EDT by telephone and verified that I am speaking with the correct person using two identifiers.  Location: Patient: at work Provider: office   I discussed the limitations, risks, security and privacy concerns of performing an evaluation and management service by telephone and the availability of in person appointments. I also discussed with the patient that there may be a patient responsible charge related to this service. The patient expressed understanding and agreed to proceed.   History of Present Illness: "It seems like I have turned a corner. I don't know how or why. I just feel better". She is no longer crying every day. She doesn't obsess on 1 subject. Her mind feels better and she is no longer over thinking. The depression is not as intense. She is better able to tolerate stress. She is not having as much self loathing. Her anxiety remains high. She finds that it is improving a little. She often comes home shaking. She takes the Klonopin 1/2 tab at lunch and at bedtime. Jennifer [name] states her sleep is good with Trazodone and 1/2 Klonopin. Pt denies SI/HI.     Observations/Objective: I spoke with Jennifer [name] on the phone.  Pt was calm, pleasant and cooperative.  Pt was engaged in the conversation and answered questions appropriately.  Speech was clear and coherent with normal rate, tone and volume.  Mood is depressed and anxious, affect is congruent but brighter and calmer than previous visits. Thought processes are coherent, goal oriented and intact.  Thought content is logical.  Pt denies SI/HI.   Pt denies auditory and visual hallucinations and did not appear to be responding to internal stimuli.  Memory and concentration are good.  Fund of knowledge and use of language are average.  Insight and judgment are fair.  I am unable to comment on psychomotor activity, general appearance, hygiene,  or eye contact as I was unable to physically see the patient on the phone.  Vital signs not available since interview conducted virtually.     Assessment and Plan: MDD- recurrent, severe without psychotic features; GAD; Insomnia  Status of current symptoms: improved depression  Paxil 60mg  po qD Trazodone 25mg  po qHS prn insomnia Lithobid 600mg  po qD Klonopin 0.5mg  po BID prn anxiety    Follow Up Instructions: In 8-12 weeks or sooner if needed   I discussed the assessment and treatment plan with the patient. The patient was provided an opportunity to ask questions and all were answered. The patient agreed with the plan and demonstrated an understanding of the instructions.   The patient was advised to call back or seek an in-person evaluation if the symptoms worsen or if the condition fails to improve as anticipated.  I provided 20 minutes of non-face-to-face time during this encounter.   [name], MD

## 2019-05-30 ENCOUNTER — Ambulatory Visit (INDEPENDENT_AMBULATORY_CARE_PROVIDER_SITE_OTHER): Payer: 59 | Admitting: Licensed Clinical Social Worker

## 2019-05-30 DIAGNOSIS — F3341 Major depressive disorder, recurrent, in partial remission: Secondary | ICD-10-CM

## 2019-06-27 ENCOUNTER — Ambulatory Visit (INDEPENDENT_AMBULATORY_CARE_PROVIDER_SITE_OTHER): Payer: 59 | Admitting: Licensed Clinical Social Worker

## 2019-06-27 DIAGNOSIS — F3340 Major depressive disorder, recurrent, in remission, unspecified: Secondary | ICD-10-CM | POA: Diagnosis not present

## 2019-08-06 ENCOUNTER — Other Ambulatory Visit: Payer: Self-pay | Admitting: Obstetrics and Gynecology

## 2019-08-06 DIAGNOSIS — Z122 Encounter for screening for malignant neoplasm of respiratory organs: Secondary | ICD-10-CM

## 2019-08-06 LAB — HM PAP SMEAR: HM Pap smear: NEGATIVE

## 2019-08-14 ENCOUNTER — Other Ambulatory Visit: Payer: Self-pay

## 2019-08-14 ENCOUNTER — Ambulatory Visit
Admission: RE | Admit: 2019-08-14 | Discharge: 2019-08-14 | Disposition: A | Discharge status: Home / Self Care | Payer: Managed Care, Other (non HMO) | Source: Ambulatory Visit | Attending: Obstetrics and Gynecology | Admitting: Obstetrics and Gynecology

## 2019-08-14 DIAGNOSIS — Z122 Encounter for screening for malignant neoplasm of respiratory organs: Secondary | ICD-10-CM

## 2019-08-15 ENCOUNTER — Ambulatory Visit (INDEPENDENT_AMBULATORY_CARE_PROVIDER_SITE_OTHER): Payer: 59 | Admitting: Psychiatry

## 2019-08-15 ENCOUNTER — Encounter (HOSPITAL_COMMUNITY): Payer: Self-pay | Admitting: Psychiatry

## 2019-08-15 ENCOUNTER — Telehealth (HOSPITAL_COMMUNITY): Payer: Self-pay | Admitting: Psychiatry

## 2019-08-15 DIAGNOSIS — F411 Generalized anxiety disorder: Secondary | ICD-10-CM | POA: Diagnosis not present

## 2019-08-15 DIAGNOSIS — F332 Major depressive disorder, recurrent severe without psychotic features: Secondary | ICD-10-CM

## 2019-08-15 MED ORDER — CLONAZEPAM 0.5 MG PO TABS: 0.5000 mg | ORAL_TABLET | Freq: Every day | ORAL | 0 refills | Status: DC | PRN

## 2019-08-15 MED ORDER — LITHIUM CARBONATE ER 300 MG PO TBCR: 600.0000 mg | EXTENDED_RELEASE_TABLET | Freq: Every day | ORAL | 0 refills | Status: DC

## 2019-08-15 MED ORDER — PAROXETINE HCL 40 MG PO TABS: 60.0000 mg | ORAL_TABLET | ORAL | 0 refills | Status: DC

## 2019-08-15 NOTE — Progress Notes (Signed)
  Virtual Visit via Telephone Note  I connected with Jennifer [name]  on 08/15/19 at  3:00 PM EST by telephone and verified that I am speaking with the correct person using two identifiers.  Location: Patient: home Provider: office   I discussed the limitations, risks, security and privacy concerns of performing an evaluation and management service by telephone and the availability of in person appointments. I also discussed with the patient that there may be a patient responsible charge related to this service. The patient expressed understanding and agreed to proceed.   History of Present Illness: Overall Jennifer [name] is doing fairly well. She notes that in the back of her head she feels something isn't right. She denies impending doom but she is scared to relax. Jennifer [name] thinks she is protecting herself from getting hurt or feeling guilty about Jennifer [name]'s death. Other than feeling overwhelmed and a little down at Christmas she is doing well. Depression is mild and manageable. Sleep is fair with Trazodone. She denies anhedonia and isolation. Jennifer [name] denies SI/HI. She feels anxiety is her biggest problem right now. When really busy or overwhelmed she feels shaky and anxious. She takes 0.5mg  tab of Klonopin after work daily. It calms her down.     Observations/Objective: I spoke with [name] on the phone.  Pt was calm, pleasant and cooperative.  Pt was engaged in the conversation and answered questions appropriately.  Speech was clear and coherent with normal rate, tone and volume.  Mood is anxious, affect is full. Thought processes are coherent, goal oriented and intact.  Thought content is logical.  Pt denies SI/HI.   Pt denies auditory and visual hallucinations and did not appear to be responding to internal stimuli.  Memory and concentration are good.  Fund of knowledge and use of language are average.  Insight and judgment are fair.  I am unable to comment on psychomotor activity, general  appearance, hygiene, or eye contact as I was unable to physically see the patient on the phone.  Vital signs not available since interview conducted virtually.     Assessment and Plan: MDD-recurrent, severe without psychotic features; GAD; Insomnia  Status of current symptoms: stable  Paxil 60mg  po qD  Trazodone 25mg  po qHS prn insomnia- no refill today  Lithobid 600mg  po qD  Decrease Klonopin 0.5mg  po qD prn anxiety- no refills   Follow Up Instructions: In 12 weeks or sooner if needed   I discussed the assessment and treatment plan with the patient. The patient was provided an opportunity to ask questions and all were answered. The patient agreed with the plan and demonstrated an understanding of the instructions.   The patient was advised to call back or seek an in-person evaluation if the symptoms worsen or if the condition fails to improve as anticipated.  I provided 25 minutes of non-face-to-face time during this encounter.   [name], MD

## 2019-08-21 LAB — LIPID PANEL
Cholesterol: 203 — AB (ref 0–200)
HDL: 36 (ref 35–70)
LDL Cholesterol: 149
LDl/HDL Ratio: 4.1
Triglycerides: 98 (ref 40–160)

## 2019-08-21 LAB — COMPREHENSIVE METABOLIC PANEL: Calcium: 9.5 (ref 8.7–10.7)

## 2019-08-21 LAB — BASIC METABOLIC PANEL
BUN: 9 (ref 4–21)
Creatinine: 0.8 (ref 0.5–1.1)
Glucose: 92

## 2019-08-22 LAB — SARS-COV-2 ANTIBODIES: SARS CoV-2 AB IgG: NEGATIVE

## 2019-09-03 ENCOUNTER — Encounter: Payer: Self-pay | Admitting: Obstetrics and Gynecology

## 2019-09-12 ENCOUNTER — Encounter: Payer: Self-pay | Admitting: Family Medicine

## 2019-09-12 ENCOUNTER — Other Ambulatory Visit: Payer: Self-pay

## 2019-09-12 ENCOUNTER — Ambulatory Visit (INDEPENDENT_AMBULATORY_CARE_PROVIDER_SITE_OTHER): Payer: Managed Care, Other (non HMO) | Admitting: Family Medicine

## 2019-09-12 VITALS — BP 120/62 | HR 80 | Temp 98.1°F | Ht 66.25 in | Wt 207.5 lb

## 2019-09-12 DIAGNOSIS — J4520 Mild intermittent asthma, uncomplicated: Secondary | ICD-10-CM | POA: Diagnosis not present

## 2019-09-12 DIAGNOSIS — H6983 Other specified disorders of Eustachian tube, bilateral: Secondary | ICD-10-CM | POA: Diagnosis not present

## 2019-09-12 DIAGNOSIS — E785 Hyperlipidemia, unspecified: Secondary | ICD-10-CM | POA: Diagnosis not present

## 2019-09-12 DIAGNOSIS — H6993 Unspecified Eustachian tube disorder, bilateral: Secondary | ICD-10-CM | POA: Insufficient documentation

## 2019-09-12 DIAGNOSIS — J45909 Unspecified asthma, uncomplicated: Secondary | ICD-10-CM | POA: Insufficient documentation

## 2019-09-12 DIAGNOSIS — F17200 Nicotine dependence, unspecified, uncomplicated: Secondary | ICD-10-CM

## 2019-09-12 DIAGNOSIS — I700 Atherosclerosis of aorta: Secondary | ICD-10-CM | POA: Diagnosis not present

## 2019-09-12 MED ORDER — ATORVASTATIN CALCIUM 10 MG PO TABS: 10.0000 mg | ORAL_TABLET | Freq: Every day | ORAL | 3 refills | Status: DC

## 2019-09-12 MED ORDER — ALBUTEROL SULFATE HFA 108 (90 BASE) MCG/ACT IN AERS: 1.0000 | INHALATION_SPRAY | Freq: Four times a day (QID) | RESPIRATORY_TRACT | 0 refills | Status: DC | PRN

## 2019-09-12 MED ORDER — ASPIRIN EC 81 MG PO TBEC: 81.0000 mg | DELAYED_RELEASE_TABLET | Freq: Every day | ORAL | 3 refills | Status: DC

## 2019-09-12 NOTE — Progress Notes (Signed)
Subjective:     Jennifer [name] is a 54 y.o. female presenting for Establish Care (previous PCP Dr Sonia Baller at [facility]) and Hyperlipidemia (address levels per her GYN)     HPI   #ear fullness - feels like a fish bowl over her head - symptoms x 1 year - will occur with sneezing - hx of allergies - tried cleaning her ears with ear wax treatment - some ringing but not bothersome - hearing is normal  #tobacco use - got the patch and the timing wasn't right - does not want to feel the pressure - just needs to feel ready and isn't there yet  Review of Systems   Social History   Tobacco Use  Smoking Status Current Every Day Smoker  . Packs/day: 0.50  . Years: 30.00  . Pack years: 15.00  . Types: Cigarettes  Smokeless Tobacco Never Used        Objective:    BP Readings from Last 3 Encounters:  09/12/19 120/62  10/20/17 116/65  08/10/17 119/77   Wt Readings from Last 3 Encounters:  09/12/19 207 lb 8 oz (94.1 kg)  10/20/17 220 lb (99.8 kg)  10/06/17 225 lb (102.1 kg)    BP 120/62   Pulse 80   Temp 98.1 F (36.7 C)   Ht 5' 6.25" (1.683 m)   Wt 207 lb 8 oz (94.1 kg)   SpO2 96%   BMI 33.24 kg/m    Physical Exam Constitutional:      General: She is not in acute distress.    Appearance: She is well-developed. She is not diaphoretic.  HENT:     Right Ear: External ear normal. A middle ear effusion is present. Tympanic membrane is not injected or erythematous.     Left Ear: External ear normal. A middle ear effusion is present. Tympanic membrane is not injected or erythematous.     Nose: Nose normal.  Eyes:     Conjunctiva/sclera: Conjunctivae normal.  Cardiovascular:     Rate and Rhythm: Normal rate and regular rhythm.     Heart sounds: No murmur.  Pulmonary:     Effort: Pulmonary effort is normal. No respiratory distress.     Breath sounds: No wheezing.  Musculoskeletal:     Cervical back: Neck supple.  Skin:    General: Skin is warm and  dry.     Capillary Refill: Capillary refill takes less than 2 seconds.  Neurological:     Mental Status: She is alert. Mental status is at baseline.  Psychiatric:        Mood and Affect: Mood normal.        Behavior: Behavior normal.     The 10-year ASCVD risk score Mikey Bussing DC Jr., et al., 2013) is: 6.4%   Values used to calculate the score:     Age: 67 years     Sex: Female     Is Non-Hispanic African American: No     Diabetic: No     Tobacco smoker: Yes     Systolic Blood Pressure: 123456 mmHg     Is BP treated: No     HDL Cholesterol: 36 mg/dL     Total Cholesterol: 203 mg/dL       Assessment & Plan:   Problem List Items Addressed This Visit      Cardiovascular and Mediastinum   Aortic atherosclerosis ([place])    Reviewed prior imaging and discussed that she would benefit from statin and asa to reduce  risk. Elected to start today.       Relevant Medications   atorvastatin (LIPITOR) 10 MG tablet   aspirin EC 81 MG tablet     Respiratory   Reactive airway disease    Primarily with viral illnesses, though possible COPD given smoking hx. Refill today to have on hand. Clear lungs on exam      Relevant Medications   albuterol (VENTOLIN HFA) 108 (90 Base) MCG/ACT inhaler     Nervous and Auditory   Eustachian tube dysfunction, bilateral    Advised trial of allergy medication and flonase. PRN decongestant. Encouraged smoking cessation        Other   SMOKER    Not currently ready to quit. Has the patches but not mentally prepared. Encouraged her to quit when ready      Hyperlipidemia - Primary    ASCVD 6.4% which is lower risk, but given known disease would benefit from statin start today. Repeat lipids in 6 months      Relevant Medications   atorvastatin (LIPITOR) 10 MG tablet   aspirin EC 81 MG tablet       Return in about 6 months (around 03/11/2020).  [name], MD

## 2019-09-12 NOTE — Assessment & Plan Note (Signed)
Primarily with viral illnesses, though possible COPD given smoking hx. Refill today to have on hand. Clear lungs on exam

## 2019-09-12 NOTE — Assessment & Plan Note (Signed)
Reviewed prior imaging and discussed that she would benefit from statin and asa to reduce risk. Elected to start today.

## 2019-09-12 NOTE — Patient Instructions (Addendum)
1) Daily allergy medication - Claritin, Allegra, Zyrtec (store brand OK) 2) Flonase 3) If worsening > can try decongestant medication   Start the medication for cholesterol Return in 6 months  Eustachian Tube Dysfunction  Eustachian tube dysfunction refers to a condition in which a blockage develops in the narrow passage that connects the middle ear to the back of the nose (eustachian tube). The eustachian tube regulates air pressure in the middle ear by letting air move between the ear and nose. It also helps to drain fluid from the middle ear space. Eustachian tube dysfunction can affect one or both ears. When the eustachian tube does not function properly, air pressure, fluid, or both can build up in the middle ear. What are the causes? This condition occurs when the eustachian tube becomes blocked or cannot open normally. Common causes of this condition include:  Ear infections.  Colds and other infections that affect the nose, mouth, and throat (upper respiratory tract).  Allergies.  Irritation from cigarette smoke.  Irritation from stomach acid coming up into the esophagus (gastroesophageal reflux). The esophagus is the tube that carries food from the mouth to the stomach.  Sudden changes in air pressure, such as from descending in an airplane or scuba diving.  Abnormal growths in the nose or throat, such as: ? Growths that line the nose (nasal polyps). ? Abnormal growth of cells (tumors). ? Enlarged tissue at the back of the throat (adenoids). What increases the risk? You are more likely to develop this condition if:  You smoke.  You are overweight.  You are a child who has: ? Certain birth defects of the mouth, such as cleft palate. ? Large tonsils or adenoids. What are the signs or symptoms? Common symptoms of this condition include:  A feeling of fullness in the ear.  Ear pain.  Clicking or popping noises in the ear.  Ringing in the ear.  Hearing  loss.  Loss of balance.  Dizziness. Symptoms may get worse when the air pressure around you changes, such as when you travel to an area of high elevation, fly on an airplane, or go scuba diving. How is this diagnosed? This condition may be diagnosed based on:  Your symptoms.  A physical exam of your ears, nose, and throat.  Tests, such as those that measure: ? The movement of your eardrum (tympanogram). ? Your hearing (audiometry). How is this treated? Treatment depends on the cause and severity of your condition.  In mild cases, you may relieve your symptoms by moving air into your ears. This is called "popping the ears."  In more severe cases, or if you have symptoms of fluid in your ears, treatment may include: ? Medicines to relieve congestion (decongestants). ? Medicines that treat allergies (antihistamines). ? Nasal sprays or ear drops that contain medicines that reduce swelling (steroids). ? A procedure to drain the fluid in your eardrum (myringotomy). In this procedure, a small tube is placed in the eardrum to:  Drain the fluid.  Restore the air in the middle ear space. ? A procedure to insert a balloon device through the nose to inflate the opening of the eustachian tube (balloon dilation). Follow these instructions at home: Lifestyle  Do not do any of the following until your health care provider approves: ? Travel to high altitudes. ? Fly in airplanes. ? Work in a Pension scheme manager or room. ? Scuba dive.  Do not use any products that contain nicotine or tobacco, such as cigarettes and  e-cigarettes. If you need help quitting, ask your health care provider.  Keep your ears dry. Wear fitted earplugs during showering and bathing. Dry your ears completely after. General instructions  Take over-the-counter and prescription medicines only as told by your health care provider.  Use techniques to help pop your ears as recommended by your health care provider. These  may include: ? Chewing gum. ? Yawning. ? Frequent, forceful swallowing. ? Closing your mouth, holding your nose closed, and gently blowing as if you are trying to blow air out of your nose.  Keep all follow-up visits as told by your health care provider. This is important. Contact a health care provider if:  Your symptoms do not go away after treatment.  Your symptoms come back after treatment.  You are unable to pop your ears.  You have: ? A fever. ? Pain in your ear. ? Pain in your head or neck. ? Fluid draining from your ear.  Your hearing suddenly changes.  You become very dizzy.  You lose your balance. Summary  Eustachian tube dysfunction refers to a condition in which a blockage develops in the eustachian tube.  It can be caused by ear infections, allergies, inhaled irritants, or abnormal growths in the nose or throat.  Symptoms include ear pain, hearing loss, or ringing in the ears.  Mild cases are treated with maneuvers to unblock the ears, such as yawning or ear popping.  Severe cases are treated with medicines. Surgery may also be done (rare). This information is not intended to replace advice given to you by your health care provider. Make sure you discuss any questions you have with your health care provider. Document Revised: 11/14/2017 Document Reviewed: 11/14/2017 Elsevier Patient Education  [place].

## 2019-09-12 NOTE — Assessment & Plan Note (Signed)
Advised trial of allergy medication and flonase. PRN decongestant. Encouraged smoking cessation

## 2019-09-12 NOTE — Assessment & Plan Note (Signed)
ASCVD 6.4% which is lower risk, but given known disease would benefit from statin start today. Repeat lipids in 6 months

## 2019-09-12 NOTE — Assessment & Plan Note (Signed)
Not currently ready to quit. Has the patches but not mentally prepared. Encouraged her to quit when ready

## 2019-11-14 ENCOUNTER — Other Ambulatory Visit: Payer: Self-pay

## 2019-11-14 ENCOUNTER — Ambulatory Visit (INDEPENDENT_AMBULATORY_CARE_PROVIDER_SITE_OTHER): Payer: 59 | Admitting: Psychiatry

## 2019-11-14 ENCOUNTER — Encounter (HOSPITAL_COMMUNITY): Payer: Self-pay | Admitting: Psychiatry

## 2019-11-14 DIAGNOSIS — F332 Major depressive disorder, recurrent severe without psychotic features: Secondary | ICD-10-CM

## 2019-11-14 DIAGNOSIS — F99 Mental disorder, not otherwise specified: Secondary | ICD-10-CM | POA: Diagnosis not present

## 2019-11-14 DIAGNOSIS — F5105 Insomnia due to other mental disorder: Secondary | ICD-10-CM

## 2019-11-14 DIAGNOSIS — F411 Generalized anxiety disorder: Secondary | ICD-10-CM | POA: Diagnosis not present

## 2019-11-14 MED ORDER — PAROXETINE HCL 40 MG PO TABS: 60.0000 mg | ORAL_TABLET | ORAL | 0 refills | Status: DC

## 2019-11-14 MED ORDER — LITHIUM CARBONATE ER 300 MG PO TBCR: 600.0000 mg | EXTENDED_RELEASE_TABLET | Freq: Every day | ORAL | 0 refills | Status: DC

## 2019-11-14 MED ORDER — TRAZODONE HCL 50 MG PO TABS: ORAL_TABLET | ORAL | 0 refills | Status: DC

## 2019-11-14 NOTE — Progress Notes (Signed)
Virtual Visit via Telephone Note  I connected with Jennifer [name] on 11/14/19 at  3:00 PM EDT by telephone and verified that I am speaking with the correct person using two identifiers.  Location: Patient: home Provider: office   I discussed the limitations, risks, security and privacy concerns of performing an evaluation and management service by telephone and the availability of in person appointments. I also discussed with the patient that there may be a patient responsible charge related to this service. The patient expressed understanding and agreed to proceed.   History of Present Illness: Overall she is doing well. Her relationship with her boyfriend is great. She got written up at work due to a mistake. For the last 2 weeks she has been thinking about [name] a lot. She misses him and is feeling kinda sad.  Her anxiety is manageable. She stopped Klonopin the day of our last appointment. Her depression is present but it is not overwhelming. She hopes to "snap out of it" soon. She is active and the depression has not affected her quality of life. [name] denies SI/HI. She doesn't want her meds changed today.    Observations/Objective:  General Appearance: unable to assess  Eye Contact:  unable to assess  Speech:  Clear and Coherent and Normal Rate  Volume:  Normal  Mood:  Depressed  Affect:  Full Range  Thought Process:  Goal Directed, Linear and Descriptions of Associations: Intact  Orientation:  Full (Time, Place, and Person)  Thought Content:  Logical  Suicidal Thoughts:  No  Homicidal Thoughts:  No  Memory:  Immediate;   Good  Judgement:  Good  Insight:  Good  Psychomotor Activity: unable to assess  Concentration:  Concentration: Good and Attention Span: Good  Recall:  Good  Fund of Knowledge:  Good  Language:  Good  Akathisia:  unable to assess  Handed:  Right  AIMS (if indicated):     Assets:  Communication Skills Desire for Improvement Financial  Resources/Insurance Housing Intimacy Leisure Time Resilience Social Support Talents/Skills Transportation Vocational/Educational  ADL's:  unable to assess  Cognition:  WNL  Sleep:         Assessment and Plan:  MDD-recurrent, severe without psychotic features; GAD; Insomnia  Status of current symptoms: mild worsening of depression  Paxil 60mg  po qD  Trazodone 25mg  po qHS prn insomnia  Lithobid 600mg  po qD  D/c Klonopin    Follow Up Instructions: In 2-3 months or sooner if needed  I discussed the assessment and treatment plan with the patient. The patient was provided an opportunity to ask questions and all were answered. The patient agreed with the plan and demonstrated an understanding of the instructions.   The patient was advised to call back or seek an in-person evaluation if the symptoms worsen or if the condition fails to improve as anticipated.  I provided 20 minutes of non-face-to-face time during this encounter.   [name], MD

## 2020-02-06 ENCOUNTER — Telehealth (INDEPENDENT_AMBULATORY_CARE_PROVIDER_SITE_OTHER): Payer: 59 | Admitting: Psychiatry

## 2020-02-06 ENCOUNTER — Encounter (HOSPITAL_COMMUNITY): Payer: Self-pay | Admitting: Psychiatry

## 2020-02-06 ENCOUNTER — Other Ambulatory Visit: Payer: Self-pay

## 2020-02-06 DIAGNOSIS — F332 Major depressive disorder, recurrent severe without psychotic features: Secondary | ICD-10-CM

## 2020-02-06 DIAGNOSIS — F411 Generalized anxiety disorder: Secondary | ICD-10-CM

## 2020-02-06 MED ORDER — PAROXETINE HCL 40 MG PO TABS: 60.0000 mg | ORAL_TABLET | ORAL | 0 refills | Status: DC

## 2020-02-06 MED ORDER — LITHIUM CARBONATE ER 300 MG PO TBCR: 600.0000 mg | EXTENDED_RELEASE_TABLET | Freq: Every day | ORAL | 0 refills | Status: DC

## 2020-02-06 NOTE — Progress Notes (Signed)
Virtual Visit via Telephone Note  I connected with Jennifer [name] on 02/06/20 at  3:30 PM EDT by telephone and verified that I am speaking with the correct person using two identifiers.  Location: Patient: home Provider: office   I discussed the limitations, risks, security and privacy concerns of performing an evaluation and management service by telephone and the availability of in person appointments. I also discussed with the patient that there may be a patient responsible charge related to this service. The patient expressed understanding and agreed to proceed.   History of Present Illness: Jennifer [name] had to put her dog down earlier this week. It was hard. She is grieving and notes that she has no energy to do anything. It could be due to the heat. Her depression is mild and manageable. She denies low motivation, hopelessness. Sleep is good and she is not taking Trazodone. Jennifer [name] is getting 9-10 hrs of sleep/night.    Observations/Objective:  General Appearance: unable to assess  Eye Contact:  unable to assess  Speech:  Clear and Coherent and Normal Rate  Volume:  Normal  Mood:  Euthymic  Affect:  Full Range  Thought Process:  Goal Directed and Descriptions of Associations: Intact  Orientation:  Full (Time, Place, and Person)  Thought Content:  Logical  Suicidal Thoughts:  No  Homicidal Thoughts:  No  Memory:  Immediate;   Good  Judgement:  Good  Insight:  Good  Psychomotor Activity: unable to assess  Concentration:  Concentration: Good  Recall:  Good  Fund of Knowledge:  Good  Language:  Good  Akathisia:  unable to assess  Handed:  Right  AIMS (if indicated):     Assets:  Communication Skills Desire for Improvement Financial Resources/Insurance Housing Intimacy Leisure Time Physical Health Resilience Social Support Talents/Skills Transportation Vocational/Educational  ADL's:  unable to assess  Cognition:  WNL  Sleep:         Assessment and Plan: MDD-  recurrent, severe without psychotic features; GAD; Insomnia  D/C Trazodone  Paxil 60mg  po qD  Lithobid 600mg  po qD   Follow Up Instructions: In 2-3 months or sooner if needed   I discussed the assessment and treatment plan with the patient. The patient was provided an opportunity to ask questions and all were answered. The patient agreed with the plan and demonstrated an understanding of the instructions.   The patient was advised to call back or seek an in-person evaluation if the symptoms worsen or if the condition fails to improve as anticipated.  I provided 15 minutes of non-face-to-face time during this encounter.   [name], MD

## 2020-03-12 ENCOUNTER — Ambulatory Visit: Payer: Self-pay | Admitting: Family Medicine

## 2020-03-23 ENCOUNTER — Encounter: Payer: Managed Care, Other (non HMO) | Admitting: Family Medicine

## 2020-03-23 ENCOUNTER — Other Ambulatory Visit: Payer: Self-pay

## 2020-03-23 NOTE — Progress Notes (Signed)
Error

## 2020-03-26 ENCOUNTER — Other Ambulatory Visit: Payer: Self-pay

## 2020-03-26 ENCOUNTER — Telehealth (INDEPENDENT_AMBULATORY_CARE_PROVIDER_SITE_OTHER): Payer: Managed Care, Other (non HMO) | Admitting: Family Medicine

## 2020-03-26 ENCOUNTER — Encounter: Payer: Self-pay | Admitting: Family Medicine

## 2020-03-26 VITALS — Ht 68.0 in | Wt 215.0 lb

## 2020-03-26 DIAGNOSIS — E785 Hyperlipidemia, unspecified: Secondary | ICD-10-CM

## 2020-03-26 DIAGNOSIS — R053 Chronic cough: Secondary | ICD-10-CM | POA: Insufficient documentation

## 2020-03-26 DIAGNOSIS — R05 Cough: Secondary | ICD-10-CM | POA: Diagnosis not present

## 2020-03-26 DIAGNOSIS — F17200 Nicotine dependence, unspecified, uncomplicated: Secondary | ICD-10-CM

## 2020-03-26 DIAGNOSIS — J4520 Mild intermittent asthma, uncomplicated: Secondary | ICD-10-CM | POA: Diagnosis not present

## 2020-03-26 DIAGNOSIS — R059 Cough, unspecified: Secondary | ICD-10-CM

## 2020-03-26 MED ORDER — PREDNISONE 50 MG PO TABS: 50.0000 mg | ORAL_TABLET | Freq: Every day | ORAL | 0 refills | Status: AC

## 2020-03-26 MED ORDER — PROMETHAZINE-DM 6.25-15 MG/5ML PO SYRP: 5.0000 mL | ORAL_SOLUTION | Freq: Four times a day (QID) | ORAL | 0 refills | Status: DC | PRN

## 2020-03-26 NOTE — Progress Notes (Signed)
I connected with Jennifer [name] on 03/26/20 at  3:40 PM EDT by video and verified that I am speaking with the correct person using two identifiers.   I discussed the limitations, risks, security and privacy concerns of performing an evaluation and management service by video and the availability of in person appointments. I also discussed with the patient that there may be a patient responsible charge related to this service. The patient expressed understanding and agreed to proceed.  Patient location: Home Provider Location: [place] Participants: [name] and Levert Feinstein Cauble   Subjective:     Jennifer [name] is a 54 y.o. female presenting for Follow-up (6 mo- hyperlipidemia)     HPI   #Cough - started 3 weeks ago with a cough - persistent  - went to [facility] in [place]  - prescribed cough syrup and abx - had taken all the medications  - Monday was a bad day symptoms wise - felt like she was getting better - but the cough syrup didn't help - no improvement with antibiotics - cough feels more in her throat - no temperature - but will get chills/fevers - has used albuterol a few times with coughing spells w/ some improvement - cough is occasionally productive  Got a covid test yesterday - no results yet   Treatment: started with allergy treatment w/o improvement. Feels like she is getting worse OTC medication - cold and sinus medication 12 hour   #Hyperlipidemia - started lipitor  - no side effects  Review of Systems  HENT: Positive for ear pain, sore throat and voice change (hoarse). Negative for congestion, rhinorrhea, sinus pressure, sinus pain and sneezing.   Eyes: Positive for discharge (white and matted shut).  Respiratory: Positive for cough and shortness of breath (after coughing spell). Negative for chest tightness.   Cardiovascular: Negative for chest pain.  Neurological: Positive for headaches.    09/12/2019: Clinic -  atherosclerosis - start statin - lipids, CMP  Social History   Tobacco Use  Smoking Status Current Every Day Smoker  . Packs/day: 0.50  . Years: 30.00  . Pack years: 15.00  . Types: Cigarettes  Smokeless Tobacco Never Used        Objective:   BP Readings from Last 3 Encounters:  09/12/19 120/62  10/20/17 116/65  08/10/17 119/77   Wt Readings from Last 3 Encounters:  03/26/20 215 lb (97.5 kg)  09/12/19 207 lb 8 oz (94.1 kg)  10/20/17 220 lb (99.8 kg)    Ht 5\' 8"  (1.727 m)   Wt 215 lb (97.5 kg)   BMI 32.69 kg/m    Physical Exam Constitutional:      Appearance: Normal appearance. She is not ill-appearing.  HENT:     Head: Normocephalic and atraumatic.     Right Ear: External ear normal.     Left Ear: External ear normal.     Mouth/Throat:     Comments: Hoarse voice Eyes:     Conjunctiva/sclera: Conjunctivae normal.  Pulmonary:     Effort: Pulmonary effort is normal. No respiratory distress.     Comments: coughing Neurological:     Mental Status: She is alert. Mental status is at baseline.  Psychiatric:        Mood and Affect: Mood normal.        Behavior: Behavior normal.        Thought Content: Thought content normal.        Judgment: Judgment normal.  Assessment & Plan:   Problem List Items Addressed This Visit      Respiratory   Reactive airway disease - Primary    Suspect possible exacerbation. Treat with prednisone      Relevant Medications   predniSONE (DELTASONE) 50 MG tablet     Other   SMOKER   Hyperlipidemia    Tolerating statin therapy. Will recheck cholesterol.       Relevant Orders   Lipid panel   Comprehensive metabolic panel   Cough    Persistent cough with patient with imaging findings of emphysema. No known hx of COPD but suspect steroids may be helpful. Covid testing pending. Current smoker with reassuring CT in January however if cough persisting after steroids will get CXR to evaluate for other causes.  Cough syrup per request and for symptom control. Cont OTC medications prn. Cont albuterol prn. May consider pulm referral for PFTs if persisting.       Relevant Medications   promethazine-dextromethorphan (PROMETHAZINE-DM) 6.25-15 MG/5ML syrup       Return if symptoms worsen or fail to improve.  [name], MD

## 2020-03-26 NOTE — Assessment & Plan Note (Signed)
Suspect possible exacerbation. Treat with prednisone

## 2020-03-26 NOTE — Assessment & Plan Note (Signed)
Persistent cough with patient with imaging findings of emphysema. No known hx of COPD but suspect steroids may be helpful. Covid testing pending. Current smoker with reassuring CT in January however if cough persisting after steroids will get CXR to evaluate for other causes. Cough syrup per request and for symptom control. Cont OTC medications prn. Cont albuterol prn. May consider pulm referral for PFTs if persisting.

## 2020-03-26 NOTE — Assessment & Plan Note (Signed)
Tolerating statin therapy. Will recheck cholesterol.

## 2020-03-26 NOTE — Patient Instructions (Signed)
Update when you get your covid results.   If positive will need to isolate 10 days from day of test.   Cough - syrup prescribed - prednisone for 5 days - can also try honey in tea or a spoonful - if your symptoms are not better after prednisone let me know and we will plan to get a Chest X-ray next week (unless covid test is positive)  Return for labs next week (pending covid test)

## 2020-03-29 ENCOUNTER — Encounter: Payer: Self-pay | Admitting: Family Medicine

## 2020-04-30 ENCOUNTER — Other Ambulatory Visit: Payer: Self-pay

## 2020-04-30 ENCOUNTER — Telehealth (INDEPENDENT_AMBULATORY_CARE_PROVIDER_SITE_OTHER): Payer: 59 | Admitting: Psychiatry

## 2020-04-30 DIAGNOSIS — F332 Major depressive disorder, recurrent severe without psychotic features: Secondary | ICD-10-CM | POA: Diagnosis not present

## 2020-04-30 DIAGNOSIS — F411 Generalized anxiety disorder: Secondary | ICD-10-CM

## 2020-04-30 MED ORDER — PAROXETINE HCL 40 MG PO TABS: 60.0000 mg | ORAL_TABLET | ORAL | 0 refills | Status: DC

## 2020-04-30 MED ORDER — LITHIUM CARBONATE ER 300 MG PO TBCR: 600.0000 mg | EXTENDED_RELEASE_TABLET | Freq: Every day | ORAL | 0 refills | Status: DC

## 2020-04-30 NOTE — Progress Notes (Signed)
Virtual Visit via Telephone Note  I connected with Jennifer [name] on 04/30/20 at  3:30 PM EDT by telephone and verified that I am speaking with the correct person using two identifiers.  Location: Patient: home Provider: office   I discussed the limitations, risks, security and privacy concerns of performing an evaluation and management service by telephone and the availability of in person appointments. I also discussed with the patient that there may be a patient responsible charge related to this service. The patient expressed understanding and agreed to proceed.   History of Present Illness: Aerilynn shares that she has had a lot of recent stressors. Her dog passed away due to cancer and she grieved for a while but not for an extended period of time. Charmelle went to the beach with an old friend and it was good. She has been spending time with her boyfriend in [place]. She is worried about her father's declining health. Diera is sad about her father. She cries about it sometimes but is able to move on to other activities. This year she forgot about her wedding anniversary and felt guilty afterwards. She does acknowledge that it is a good that she is moving forward. Her sleep is good. Ronita denies SI/HI.    Observations/Objective:  General Appearance: unable to assess  Eye Contact:  unable to assess  Speech:  Clear and Coherent and Normal Rate  Volume:  Normal  Mood:  Anxious and Depressed  Affect:  Full Range  Thought Process:  Coherent and Descriptions of Associations: Circumstantial  Orientation:  Full (Time, Place, and Person)  Thought Content:  Logical  Suicidal Thoughts:  No  Homicidal Thoughts:  No  Memory:  Immediate;   Good  Judgement:  Good  Insight:  Good  Psychomotor Activity: unable to assess  Concentration:  Concentration: Good  Recall:  Good  Fund of Knowledge:  Good  Language:  Good  Akathisia:  unable to assess  Handed:  Right  AIMS (if indicated):      Assets:  Communication Skills Desire for Improvement Financial Resources/Insurance Housing Intimacy Leisure Time Resilience Social Support Talents/Skills Transportation Vocational/Educational  ADL's:  unable to assess  Cognition:  WNL  Sleep:         Assessment and Plan: MDD- recurrent, severe without psychotic features; GAD; Insomnia  Paxil 60mg  po qD  Lithobid 600mg  po qD  Follow Up Instructions: In 2-3 months or sooner if needed   I discussed the assessment and treatment plan with the patient. The patient was provided an opportunity to ask questions and all were answered. The patient agreed with the plan and demonstrated an understanding of the instructions.   The patient was advised to call back or seek an in-person evaluation if the symptoms worsen or if the condition fails to improve as anticipated.  I provided 15 minutes of non-face-to-face time during this encounter.   [name], MD

## 2020-08-20 ENCOUNTER — Telehealth (INDEPENDENT_AMBULATORY_CARE_PROVIDER_SITE_OTHER): Payer: 59 | Admitting: Psychiatry

## 2020-08-20 ENCOUNTER — Other Ambulatory Visit: Payer: Self-pay

## 2020-08-20 DIAGNOSIS — F332 Major depressive disorder, recurrent severe without psychotic features: Secondary | ICD-10-CM | POA: Diagnosis not present

## 2020-08-20 DIAGNOSIS — F411 Generalized anxiety disorder: Secondary | ICD-10-CM

## 2020-08-20 MED ORDER — PAROXETINE HCL 40 MG PO TABS: 60.0000 mg | ORAL_TABLET | ORAL | 0 refills | Status: DC

## 2020-08-20 MED ORDER — LITHIUM CARBONATE ER 300 MG PO TBCR: 600.0000 mg | EXTENDED_RELEASE_TABLET | Freq: Every day | ORAL | 0 refills | Status: DC

## 2020-08-20 NOTE — Progress Notes (Signed)
Virtual Visit via Telephone Note  I connected with Jennifer [name] on 08/20/20 at  3:30 PM EST by telephone and verified that I am speaking with the correct person using two identifiers.  Location: Patient: home Provider: office   I discussed the limitations, risks, security and privacy concerns of performing an evaluation and management service by telephone and the availability of in person appointments. I also discussed with the patient that there may be a patient responsible charge related to this service. The patient expressed understanding and agreed to proceed.   History of Present Illness: Jennifer [name] shares that she had a really nice Christmas. She is happy in her relationship and it is going well. Her depression episodes are random. It could last for a few hours and then goes away. She is sleeping well. Work remains stressful and causes anxiety but it is manageable. She denies SI/HI.   Observations/Objective:  General Appearance: unable to assess  Eye Contact:  unable to assess  Speech:  Clear and Coherent and Normal Rate  Volume:  Normal  Mood:  Euthymic  Affect:  Full Range  Thought Process:  Goal Directed, Linear and Descriptions of Associations: Intact  Orientation:  Full (Time, Place, and Person)  Thought Content:  Logical  Suicidal Thoughts:  No  Homicidal Thoughts:  No  Memory:  Immediate;   Good  Judgement:  Good  Insight:  Good  Psychomotor Activity: unable to assess  Concentration:  Concentration: Good  Recall:  Good  Fund of Knowledge:  Good  Language:  Good  Akathisia:  unable to assess  Handed:  Right  AIMS (if indicated):     Assets:  Communication Skills Desire for Improvement Financial Resources/Insurance Housing Intimacy Leisure Time Resilience Social Support Talents/Skills Transportation Vocational/Educational  ADL's:  unable to assess  Cognition:  WNL  Sleep:         Assessment and Plan: 1. Severe episode of recurrent major depressive  disorder, without psychotic features (Jennifer [name]) - lithium carbonate (LITHOBID) 300 MG CR tablet; Take 2 tablets (600 mg total) by mouth daily.  Dispense: 180 tablet; Refill: 0 - PARoxetine (PAXIL) 40 MG tablet; Take 1.5 tablets (60 mg total) by mouth every morning.  Dispense: 135 tablet; Refill: 0  2. GAD (generalized anxiety disorder) - PARoxetine (PAXIL) 40 MG tablet; Take 1.5 tablets (60 mg total) by mouth every morning.  Dispense: 135 tablet; Refill: 0    Follow Up Instructions: In 3 months or sooner if needed   I discussed the assessment and treatment plan with the patient. The patient was provided an opportunity to ask questions and all were answered. The patient agreed with the plan and demonstrated an understanding of the instructions.   The patient was advised to call back or seek an in-person evaluation if the symptoms worsen or if the condition fails to improve as anticipated.  I provided 15 minutes of non-face-to-face time during this encounter.   [name], MD

## 2020-09-02 LAB — HM DEXA SCAN

## 2020-09-02 LAB — HM MAMMOGRAPHY

## 2020-09-06 ENCOUNTER — Other Ambulatory Visit: Payer: Self-pay | Admitting: Family Medicine

## 2020-09-06 DIAGNOSIS — I700 Atherosclerosis of aorta: Secondary | ICD-10-CM

## 2020-09-06 DIAGNOSIS — E785 Hyperlipidemia, unspecified: Secondary | ICD-10-CM

## 2020-09-07 NOTE — Telephone Encounter (Signed)
Pharmacy requests refill on: Atorvastatin 10 mg & Aspirin EC 81 mg   LAST REFILL: 09/12/2019 (Q-90, R-3) LAST OV: 09/12/2019 NEXT OV: Not Scheduled  PH[facility]ACY: [facility] [phone]

## 2020-09-08 NOTE — Telephone Encounter (Signed)
Called patient to schedule cpe. LVM to call back and schedule as per DPR.  

## 2020-09-09 NOTE — Telephone Encounter (Signed)
Called patient and scheduled cpe for 2/10.

## 2020-09-11 LAB — COMPREHENSIVE METABOLIC PANEL
Albumin: 4.2 (ref 3.5–5.0)
Calcium: 9.1 (ref 8.7–10.7)

## 2020-09-11 LAB — LIPID PANEL
Cholesterol: 154 (ref 0–200)
HDL: 40 (ref 35–70)
LDL Cholesterol: 99
LDl/HDL Ratio: 2.5
Triglycerides: 79 (ref 40–160)

## 2020-09-11 LAB — TSH: TSH: 0.96 (ref 0.41–5.90)

## 2020-09-11 LAB — BASIC METABOLIC PANEL
BUN: 10 (ref 4–21)
CO2: 19 (ref 13–22)
Chloride: 104 (ref 99–108)
Creatinine: 1 (ref 0.5–1.1)
Glucose: 95
Potassium: 4.4 (ref 3.4–5.3)
Sodium: 141 (ref 137–147)

## 2020-09-11 LAB — HEPATIC FUNCTION PANEL
ALT: 28 (ref 7–35)
AST: 34 (ref 13–35)
Alkaline Phosphatase: 110 (ref 25–125)
Bilirubin, Total: 0.3

## 2020-09-11 LAB — VITAMIN D 25 HYDROXY (VIT D DEFICIENCY, FRACTURES): Vit D, 25-Hydroxy: 33.7

## 2020-09-17 ENCOUNTER — Ambulatory Visit (INDEPENDENT_AMBULATORY_CARE_PROVIDER_SITE_OTHER): Payer: Managed Care, Other (non HMO) | Admitting: Family Medicine

## 2020-09-17 ENCOUNTER — Other Ambulatory Visit: Payer: Self-pay

## 2020-09-17 VITALS — BP 138/70 | HR 84 | Temp 97.6°F | Ht 66.5 in | Wt 223.2 lb

## 2020-09-17 DIAGNOSIS — I700 Atherosclerosis of aorta: Secondary | ICD-10-CM | POA: Diagnosis not present

## 2020-09-17 DIAGNOSIS — F411 Generalized anxiety disorder: Secondary | ICD-10-CM

## 2020-09-17 DIAGNOSIS — Z720 Tobacco use: Secondary | ICD-10-CM

## 2020-09-17 DIAGNOSIS — E785 Hyperlipidemia, unspecified: Secondary | ICD-10-CM | POA: Diagnosis not present

## 2020-09-17 DIAGNOSIS — F332 Major depressive disorder, recurrent severe without psychotic features: Secondary | ICD-10-CM

## 2020-09-17 DIAGNOSIS — R053 Chronic cough: Secondary | ICD-10-CM

## 2020-09-17 MED ORDER — ATORVASTATIN CALCIUM 10 MG PO TABS: 10.0000 mg | ORAL_TABLET | Freq: Every day | ORAL | 3 refills | Status: DC

## 2020-09-17 NOTE — Progress Notes (Signed)
Subjective:     Jennifer [name] is a 55 y.o. female presenting for Follow-up     HPI  #Tobacco use - has cut back  - quit twice during pregnancy - has patches at home - not yet ready - short period of time at 1 ppd  #depression - following with psych - a little harder now but overall feels well  #Cough - chronic - did recently have a cold but this has persisted for 1 year - no wheezing - has not tried ppi or allergy medication - some ear fullness today on the left  Review of Systems   Social History   Tobacco Use  Smoking Status Current Every Day Smoker  . Packs/day: 0.50  . Years: 30.00  . Pack years: 15.00  . Types: Cigarettes  Smokeless Tobacco Never Used        Objective:    BP Readings from Last 3 Encounters:  09/17/20 138/70  09/12/19 120/62  10/20/17 116/65   Wt Readings from Last 3 Encounters:  09/17/20 223 lb 4 oz (101.3 kg)  03/26/20 215 lb (97.5 kg)  09/12/19 207 lb 8 oz (94.1 kg)    BP 138/70   Pulse 84   Temp 97.6 F (36.4 C) (Temporal)   Ht 5' 6.5" (1.689 m)   Wt 223 lb 4 oz (101.3 kg)   SpO2 96%   BMI 35.49 kg/m    Physical Exam Constitutional:      General: She is not in acute distress.    Appearance: She is well-developed. She is not diaphoretic.  HENT:     Head: Normocephalic and atraumatic.     Right Ear: Tympanic membrane and external ear normal.     Left Ear: Tympanic membrane and external ear normal.     Nose: Nose normal.  Eyes:     Conjunctiva/sclera: Conjunctivae normal.  Cardiovascular:     Rate and Rhythm: Normal rate and regular rhythm.     Heart sounds: No murmur heard.   Pulmonary:     Effort: Pulmonary effort is normal. No respiratory distress.     Breath sounds: Normal breath sounds. No wheezing.  Musculoskeletal:     Cervical back: Neck supple.  Skin:    General: Skin is warm and dry.     Capillary Refill: Capillary refill takes less than 2 seconds.  Neurological:     Mental Status: She  is alert. Mental status is at baseline.  Psychiatric:        Mood and Affect: Mood normal.        Behavior: Behavior normal.      Depression screen [facility] 2/9 09/17/2020 09/12/2019 02/15/2016  Decreased Interest 1 1 3   Down, Depressed, Hopeless 1 1 3   PHQ - 2 Score 2 2 6   Altered sleeping 0 0 3  Tired, decreased energy 1 2 3   Change in appetite 1 0 3  Feeling bad or failure about yourself  2 1 0  Trouble concentrating 0 0 1  Moving slowly or fidgety/restless 0 0 2  Suicidal thoughts 0 0 3  PHQ-9 Score 6 5 21   Difficult doing work/chores Somewhat difficult - Extremely dIfficult        Assessment & Plan:   Problem List Items Addressed This Visit      Cardiovascular and Mediastinum   Aortic atherosclerosis ([place]) - Primary    Taking atorvastatin 10 mg. Reviewed outside LDL 99 which is improved      Relevant Medications  atorvastatin (LIPITOR) 10 MG tablet     Other   Tobacco use    Not currently ready quit. Has patches and will use this. Had lung CT scan 2021      GAD (generalized anxiety disorder)    Follows with psych. Stable on current treatment      Severe episode of recurrent major depressive disorder, without psychotic features ([place])    Dr. Roena Malady. Stable on lithium 600 mg and paroxetine 40 mg      Hyperlipidemia    LDL 99 on outside labs. Cont atorvastatin 10 mg      Relevant Medications   atorvastatin (LIPITOR) 10 MG tablet   Chronic cough    Current etiology unclear - post viral, tobacco related, silent GERD. Recommend trial of antihistamine and watchful waiting for next few weeks given recent URI. If no change trial of PPI. If no improvement - pulm consult. Advised quitting smoking.           Return in about 1 year (around 09/17/2021) for annual physical.  [name], MD  This visit occurred during the SARS-CoV-2 public health emergency.  Safety protocols were in place, including screening questions prior to the visit, additional usage of staff  PPE, and extensive cleaning of exam room while observing appropriate contact time as indicated for disinfecting solutions.

## 2020-09-17 NOTE — Patient Instructions (Addendum)
Ear fullness - try allergy pill - Sudafed   Cough - try daily allergy pill   If no response to allergy pill  Try omeprazole 20 mg (heartburn medicine) for 2 weeks - if this treats the cough continue for 8 weeks -- let me know if anything helps

## 2020-09-17 NOTE — Assessment & Plan Note (Signed)
Current etiology unclear - post viral, tobacco related, silent GERD. Recommend trial of antihistamine and watchful waiting for next few weeks given recent URI. If no change trial of PPI. If no improvement - pulm consult. Advised quitting smoking.

## 2020-09-17 NOTE — Assessment & Plan Note (Signed)
Follows with psych. Stable on current treatment

## 2020-09-17 NOTE — Assessment & Plan Note (Signed)
Dr. Roena Malady. Stable on lithium 600 mg and paroxetine 40 mg

## 2020-09-17 NOTE — Assessment & Plan Note (Addendum)
Taking atorvastatin 10 mg. Reviewed outside LDL 99 which is improved

## 2020-09-17 NOTE — Assessment & Plan Note (Signed)
Not currently ready quit. Has patches and will use this. Had lung CT scan 2021

## 2020-09-17 NOTE — Assessment & Plan Note (Signed)
LDL 99 on outside labs. Cont atorvastatin 10 mg

## 2020-09-29 ENCOUNTER — Encounter: Payer: Self-pay | Admitting: Family Medicine

## 2020-09-29 DIAGNOSIS — M8580 Other specified disorders of bone density and structure, unspecified site: Secondary | ICD-10-CM | POA: Insufficient documentation

## 2020-10-14 ENCOUNTER — Encounter: Payer: Self-pay | Admitting: Family Medicine

## 2020-11-19 ENCOUNTER — Telehealth (HOSPITAL_COMMUNITY): Payer: 59 | Admitting: Psychiatry

## 2020-12-03 ENCOUNTER — Other Ambulatory Visit: Payer: Self-pay

## 2020-12-03 ENCOUNTER — Telehealth (INDEPENDENT_AMBULATORY_CARE_PROVIDER_SITE_OTHER): Payer: 59 | Admitting: Psychiatry

## 2020-12-03 DIAGNOSIS — F411 Generalized anxiety disorder: Secondary | ICD-10-CM | POA: Diagnosis not present

## 2020-12-03 DIAGNOSIS — F332 Major depressive disorder, recurrent severe without psychotic features: Secondary | ICD-10-CM | POA: Diagnosis not present

## 2020-12-03 MED ORDER — PAROXETINE HCL 40 MG PO TABS: 60.0000 mg | ORAL_TABLET | ORAL | 0 refills | Status: DC

## 2020-12-03 MED ORDER — LITHIUM CARBONATE ER 300 MG PO TBCR: 600.0000 mg | EXTENDED_RELEASE_TABLET | Freq: Every day | ORAL | 0 refills | Status: DC

## 2020-12-03 NOTE — Progress Notes (Signed)
Virtual Visit via Video Note  I connected with Jennifer [name] on 12/03/20 at  3:30 PM EDT by a video enabled telemedicine application and verified that I am speaking with the correct person using two identifiers.  Location: Patient: home Provider: office   I discussed the limitations of evaluation and management by telemedicine and the availability of in person appointments. The patient expressed understanding and agreed to proceed.  History of Present Illness: Jennifer [name] shares a lot recent stressors- her uncle passed away, her cousin committed suicide (they were not close), broke 2 teeth and had surgery and ended up having 3 teeth removed (It makes her feel bad about herself), her dad's health is getting worse. Its a lot to deal with. Her sleep is poor and she is waking up multiple times a night and has low energy. Her mood is down. Jennifer [name] is still active despite feeling low. She denies SI/HI. Jennifer [name]'s anxiety is elevated. She is angry at work due to dealing with management.    Observations/Objective:  Psychiatric Specialty Exam: ROS  There were no vitals taken for this visit.There is no height or weight on file to calculate BMI.  General Appearance: Casual and Neat  Eye Contact:  Good  Speech:  Clear and Coherent and Normal Rate  Volume:  Normal  Mood:  Anxious and Depressed  Affect:  Full Range  Thought Process:  Goal Directed, Linear and Descriptions of Associations: Intact  Orientation:  Full (Time, Place, and Person)  Thought Content:  Logical  Suicidal Thoughts:  No  Homicidal Thoughts:  No  Memory:  Immediate;   Good  Judgement:  Good  Insight:  Good  Psychomotor Activity:  Normal  Concentration:  Concentration: Good  Recall:  Good  Fund of Knowledge:  Good  Language:  Good  Akathisia:  No  Handed:  Right  AIMS (if indicated):     Assets:  Communication Skills Desire for Improvement Financial Resources/Insurance Housing Intimacy Leisure Time Physical  Health Resilience Social Support Talents/Skills Transportation Vocational/Educational  ADL's:  Intact  Cognition:  WNL  Sleep:          Assessment and Plan: Depression screen [facility] 2/9 12/03/2020 09/17/2020 09/12/2019 02/15/2016 01/18/2016  Decreased Interest 0 1 1 3 3   Down, Depressed, Hopeless 3 1 1 3 3   PHQ - 2 Score 3 2 2 6 6   Altered sleeping 3 0 0 3 2  Tired, decreased energy 3 1 2 3 2   Change in appetite 0 1 0 3 2  Feeling bad or failure about yourself  1 2 1  0 2  Trouble concentrating 0 0 0 1 2  Moving slowly or fidgety/restless 0 0 0 2 2  Suicidal thoughts 0 0 0 3 0  PHQ-9 Score 10 6 5 21 18   Difficult doing work/chores Somewhat difficult Somewhat difficult - Extremely dIfficult Very difficult   Flowsheet Row Video Visit from 12/03/2020 in [facility] No Risk      Patches remains active and feels the depression will improve with time  She has some Trazodone and wants to take it for the insomnia  1. Severe episode of recurrent major depressive disorder, without psychotic features ([place]) - PARoxetine (PAXIL) 40 MG tablet; Take 1.5 tablets (60 mg total) by mouth every morning.  Dispense: 135 tablet; Refill: 0 - lithium carbonate (LITHOBID) 300 MG CR tablet; Take 2 tablets (600 mg total) by mouth daily.  Dispense: 180 tablet; Refill: 0  2. GAD (  generalized anxiety disorder) - PARoxetine (PAXIL) 40 MG tablet; Take 1.5 tablets (60 mg total) by mouth every morning.  Dispense: 135 tablet; Refill: 0    Follow Up Instructions: In 2-3 months or sooner if needed   I discussed the assessment and treatment plan with the patient. The patient was provided an opportunity to ask questions and all were answered. The patient agreed with the plan and demonstrated an understanding of the instructions.   The patient was advised to call back or seek an in-person evaluation if the symptoms worsen or if the condition fails to  improve as anticipated.   [name], MD

## 2020-12-09 ENCOUNTER — Other Ambulatory Visit: Payer: Self-pay | Admitting: Family Medicine

## 2020-12-09 DIAGNOSIS — I700 Atherosclerosis of aorta: Secondary | ICD-10-CM

## 2020-12-09 DIAGNOSIS — E785 Hyperlipidemia, unspecified: Secondary | ICD-10-CM

## 2021-02-18 ENCOUNTER — Telehealth (INDEPENDENT_AMBULATORY_CARE_PROVIDER_SITE_OTHER): Payer: 59 | Admitting: Psychiatry

## 2021-02-18 ENCOUNTER — Other Ambulatory Visit: Payer: Self-pay

## 2021-02-18 DIAGNOSIS — F332 Major depressive disorder, recurrent severe without psychotic features: Secondary | ICD-10-CM | POA: Diagnosis not present

## 2021-02-18 DIAGNOSIS — F411 Generalized anxiety disorder: Secondary | ICD-10-CM

## 2021-02-18 MED ORDER — PAROXETINE HCL 40 MG PO TABS: 60.0000 mg | ORAL_TABLET | ORAL | 0 refills | Status: DC

## 2021-02-18 MED ORDER — LITHIUM CARBONATE ER 300 MG PO TBCR: 600.0000 mg | EXTENDED_RELEASE_TABLET | Freq: Every day | ORAL | 0 refills | Status: DC

## 2021-02-18 NOTE — Progress Notes (Signed)
Virtual Visit via Telephone Note  I connected with Jennifer [name] [name] on 02/18/21 at  3:30 PM EDT by telephone and verified that I am speaking with the correct person using two identifiers. We were unable to connect by video due to a system error.   Location: Patient: home Provider: office   I discussed the limitations, risks, security and privacy concerns of performing an evaluation and management service by telephone and the availability of in person appointments. I also discussed with the patient that there may be a patient responsible charge related to this service. The patient expressed understanding and agreed to proceed.   History of Present Illness: Jennifer [name] is doing well. She shares that a lot of things have happened recently. Recently she heard a song that reminded her of a traumatic event with her ex husband. She thought about it for several days but didn't let it affect her more than that. It has resolved now. Jennifer [name] [name] feels her depression is stable. She has felt down about 4 days in the last 1 month. She denies any crying spells. She is not spending all her time in bed. She is active and denies isolation. Her relationship with her boyfriend is strong and is going well. She denies anhedonia. Her sleep is ok but she is tired all the time. It could be due to her work schedule. Jennifer [name]'s anxiety is mostly situational. She denies SI/HI.    Observations/Objective:  General Appearance: unable to assess  Eye Contact:  unable to assess  Speech:  Clear and Coherent and Normal Rate  Volume:  Normal  Mood:  Euthymic  Affect:  Full Range  Thought Process:  Goal Directed, Linear, and Descriptions of Associations: Intact  Orientation:  Full (Time, Place, and Person)  Thought Content:  Logical  Suicidal Thoughts:  No  Homicidal Thoughts:  No  Memory:  Immediate;   Good  Judgement:  Good  Insight:  Good  Psychomotor Activity: unable to assess  Concentration:  Concentration: Good  Recall:   Good  Fund of Knowledge:  Good  Language:  Good  Akathisia:  unable to assess  Handed:  Right  AIMS (if indicated):     Assets:  Communication Skills Desire for Improvement Financial Resources/Insurance Housing Intimacy Leisure Time Resilience Social Support Talents/Skills Transportation Vocational/Educational  ADL's:  unable to assess  Cognition:  WNL  Sleep:        Assessment and Plan: Depression screen [facility] 2/9 02/18/2021 12/03/2020 09/17/2020 09/12/2019 02/15/2016  Decreased Interest 0 0 1 1 3   Down, Depressed, Hopeless 1 3 1 1 3   PHQ - 2 Score 1 3 2 2 6   Altered sleeping - 3 0 0 3  Tired, decreased energy - 3 1 2 3   Change in appetite - 0 1 0 3  Feeling bad or failure about yourself  - 1 2 1  0  Trouble concentrating - 0 0 0 1  Moving slowly or fidgety/restless - 0 0 0 2  Suicidal thoughts - 0 0 0 3  PHQ-9 Score - 10 6 5 21   Difficult doing work/chores - Somewhat difficult Somewhat difficult - Extremely dIfficult    Flowsheet Row Video Visit from 02/18/2021 in [facility] Video Visit from 12/03/2020 in [place] No Risk No Risk       1. Severe episode of recurrent major depressive disorder, without psychotic features ([place]) - PARoxetine (PAXIL) 40 MG tablet; Take 1.5 tablets (60 mg total) by  mouth every morning.  Dispense: 135 tablet; Refill: 0 - lithium carbonate (LITHOBID) 300 MG CR tablet; Take 2 tablets (600 mg total) by mouth daily.  Dispense: 180 tablet; Refill: 0  2. GAD (generalized anxiety disorder) - PARoxetine (PAXIL) 40 MG tablet; Take 1.5 tablets (60 mg total) by mouth every morning.  Dispense: 135 tablet; Refill: 0   Follow Up Instructions: In 3 months or sooner if needed   I discussed the assessment and treatment plan with the patient. The patient was provided an opportunity to ask questions and all were answered. The patient agreed with the plan and  demonstrated an understanding of the instructions.   The patient was advised to call back or seek an in-person evaluation if the symptoms worsen or if the condition fails to improve as anticipated.  I provided 11 minutes of non-face-to-face time during this encounter.   [name], MD

## 2021-05-06 ENCOUNTER — Telehealth (HOSPITAL_BASED_OUTPATIENT_CLINIC_OR_DEPARTMENT_OTHER): Payer: 59 | Admitting: Psychiatry

## 2021-05-06 DIAGNOSIS — F411 Generalized anxiety disorder: Secondary | ICD-10-CM

## 2021-05-06 DIAGNOSIS — Z5181 Encounter for therapeutic drug level monitoring: Secondary | ICD-10-CM

## 2021-05-06 DIAGNOSIS — F332 Major depressive disorder, recurrent severe without psychotic features: Secondary | ICD-10-CM

## 2021-05-06 MED ORDER — LITHIUM CARBONATE ER 300 MG PO TBCR: 600.0000 mg | EXTENDED_RELEASE_TABLET | Freq: Every day | ORAL | 0 refills | Status: DC

## 2021-05-06 MED ORDER — PAROXETINE HCL 40 MG PO TABS: 60.0000 mg | ORAL_TABLET | ORAL | 0 refills | Status: DC

## 2021-05-06 NOTE — Progress Notes (Signed)
Virtual Visit via Video Note  I connected with Manon Banbury Bonnet on 05/06/21 at  3:30 PM EDT by a video enabled telemedicine application and verified that I am speaking with the correct person using two identifiers.  Location: Patient: home Provider: office   I discussed the limitations of evaluation and management by telemedicine and the availability of in person appointments. The patient expressed understanding and agreed to proceed.  History of Present Illness: "I have been doing ok". Her dad had an affair about 25 yrs ago. Her parents worked it out and she had a healthy relationship with her dad. 6 weeks ago her mom started talking about the affair with Selinda Eon. Corinthian's mom told her that she was very mad with Shekelia about how Samuel supported her dad. Krisinda does not want know why her mom suddenly brought it up. It has made things awkward between her mom and [place]. Mattie now feels weird spending time with her dad.  Abagayle is struggling since this conversation. She feels down. Her anxiety is a little increased due to stressors with her mom and at work. Mckenzye denies any worsening of depression. Sleep is good. She denies SI/HI.    Observations/Objective: Psychiatric Specialty Exam: ROS  There were no vitals taken for this visit.There is no height or weight on file to calculate BMI.  General Appearance: Casual and Neat  Eye Contact:  Good  Speech:  Clear and Coherent and Normal Rate  Volume:  Normal  Mood:  Euthymic  Affect:  Full Range  Thought Process:  Goal Directed, Linear, and Descriptions of Associations: Intact  Orientation:  Full (Time, Place, and Person)  Thought Content:  Logical  Suicidal Thoughts:  No  Homicidal Thoughts:  No  Memory:  Immediate;   Good  Judgement:  Good  Insight:  Good  Psychomotor Activity:  Normal  Concentration:  Concentration: Good  Recall:  Good  Fund of Knowledge:  Good  Language:  Good  Akathisia:  No  Handed:  Right  AIMS (if  indicated):     Assets:  Communication Skills Desire for Improvement Financial Resources/Insurance Housing Intimacy Leisure Time Resilience Social Support Talents/Skills Transportation Vocational/Educational  ADL's:  Intact  Cognition:  WNL  Sleep:        Assessment and Plan: 1. Severe episode of recurrent major depressive disorder, without psychotic features ([place]) - lithium carbonate (LITHOBID) 300 MG CR tablet; Take 2 tablets (600 mg total) by mouth daily.  Dispense: 180 tablet; Refill: 0 - PARoxetine (PAXIL) 40 MG tablet; Take 1.5 tablets (60 mg total) by mouth every morning.  Dispense: 135 tablet; Refill: 0  2. GAD (generalized anxiety disorder) - PARoxetine (PAXIL) 40 MG tablet; Take 1.5 tablets (60 mg total) by mouth every morning.  Dispense: 135 tablet; Refill: 0  3. Encounter for therapeutic drug monitoring - Lithium level - CBC - Comprehensive metabolic panel - TSH    Follow Up Instructions: In 2-3 months or sooner if needed   I discussed the assessment and treatment plan with the patient. The patient was provided an opportunity to ask questions and all were answered. The patient agreed with the plan and demonstrated an understanding of the instructions.   The patient was advised to call back or seek an in-person evaluation if the symptoms worsen or if the condition fails to improve as anticipated.  I provided 23 minutes of non-face-to-face time during this encounter.   [name], MD

## 2021-05-27 ENCOUNTER — Encounter: Payer: Self-pay | Admitting: Emergency Medicine

## 2021-05-27 ENCOUNTER — Other Ambulatory Visit: Payer: Self-pay

## 2021-05-27 ENCOUNTER — Ambulatory Visit
Admission: EM | Admit: 2021-05-27 | Discharge: 2021-05-27 | Disposition: A | Discharge status: Home / Self Care | Payer: Managed Care, Other (non HMO) | Attending: Emergency Medicine | Admitting: Emergency Medicine

## 2021-05-27 DIAGNOSIS — N898 Other specified noninflammatory disorders of vagina: Secondary | ICD-10-CM | POA: Insufficient documentation

## 2021-05-27 DIAGNOSIS — N390 Urinary tract infection, site not specified: Secondary | ICD-10-CM | POA: Diagnosis present

## 2021-05-27 DIAGNOSIS — Z8619 Personal history of other infectious and parasitic diseases: Secondary | ICD-10-CM | POA: Diagnosis present

## 2021-05-27 DIAGNOSIS — R310 Gross hematuria: Secondary | ICD-10-CM | POA: Insufficient documentation

## 2021-05-27 LAB — POCT URINALYSIS DIP (MANUAL ENTRY)
Bilirubin, UA: NEGATIVE
Glucose, UA: NEGATIVE mg/dL
Ketones, POC UA: NEGATIVE mg/dL
Nitrite, UA: NEGATIVE
Protein Ur, POC: NEGATIVE mg/dL
Spec Grav, UA: 1.015 (ref 1.010–1.025)
Urobilinogen, UA: 0.2 E.U./dL
pH, UA: 7 (ref 5.0–8.0)

## 2021-05-27 MED ORDER — AMOXICILLIN-POT CLAVULANATE 875-125 MG PO TABS: 1.0000 | ORAL_TABLET | Freq: Two times a day (BID) | ORAL | 0 refills | Status: AC

## 2021-05-27 MED ORDER — AMOXICILLIN-POT CLAVULANATE 875-125 MG PO TABS: 1.0000 | ORAL_TABLET | Freq: Two times a day (BID) | ORAL | 0 refills | Status: DC

## 2021-05-27 NOTE — ED Provider Notes (Signed)
[facility]    CSN: [id number] Arrival date & time: 05/27/21  1042      History   Chief Complaint Chief Complaint  Patient presents with   Hematuria    HPI Jennifer [name] is a 55 y.o. female.   Patient presents to [facility] for evaluation of orange colored urine in the toilet this morning, states in the past when she is had blood in her urine, it has been this exact same color.  Patient also c/o lower back pain and lower abdominal/pelvic pain x 3 days.  She also endorses a hx of kidney stones. States today she has been having fatigue and nausea as well.  Urinalysis in clinic today reveals both red and white blood cells.  The history is provided by the patient.   Past Medical History:  Diagnosis Date   Anxiety    Cancer ([place])    basal cell   Depression    Diverticulosis    Fundic gland polyps of stomach, benign    Low HDL (under 40)    Personal history of adenomatous colonic polyps 05/08/2012   Rosacea    Tenosynovitis, de Quervain    Urosepsis 10/2011   after stent    Patient Active Problem List   Diagnosis Date Noted   Osteopenia 09/29/2020   Chronic cough 03/26/2020   Aortic atherosclerosis ([place]) 09/12/2019   Reactive airway disease 09/12/2019   Hyperlipidemia 09/12/2019   Insomnia 02/18/2016   GAD (generalized anxiety disorder) 02/18/2016   Severe episode of recurrent major depressive disorder, without psychotic features ([place]) 02/18/2016   Depression with anxiety 10/15/2014   Personal history of adenomatous colonic polyps 05/08/2012   LOW HDL 02/28/2007   OBESITY 02/28/2007   Tobacco use 02/21/2007   ECZEMA 02/21/2007   ROSACEA 02/21/2007    Past Surgical History:  Procedure Laterality Date   CERVICAL BIOPSY  W/ LOOP ELECTRODE EXCISION  04/2003   COLONOSCOPY     CYSTOSCOPY W/ URETERAL STENT PLACEMENT  10/14/2011   Procedure: CYSTOSCOPY WITH RETROGRADE PYELOGRAM/URETERAL STENT PLACEMENT;  Surgeon: Hanley Ben, MD;  Location: [facility];  Service:  Urology;  Laterality: Right;  Right Double J Stent   MULTIPLE TOOTH EXTRACTIONS     OOPHORECTOMY     left ovary   POLYPECTOMY     TUBAL LIGATION     UMBILICAL HERNIA REPAIR     UPPER GASTROINTESTINAL ENDOSCOPY  2008   VAGINAL HYSTERECTOMY     WISDOM TOOTH EXTRACTION      OB History     Gravida  2   Para  2   Term      Preterm      AB      Living  2      SAB      IAB      Ectopic      Multiple      Live Births               Home Medications    Prior to Admission medications   Medication Sig Start Date End Date Taking? Authorizing Provider  amoxicillin-clavulanate (AUGMENTIN) 875-125 MG tablet Take 1 tablet by mouth every 12 (twelve) hours for 10 days. 05/27/21 06/06/21 Yes Lynden Oxford Scales, PA-C  acetaminophen (TYLENOL) 325 MG tablet Take 650 mg by mouth every 6 (six) hours as needed.    [provider]  albuterol (VENTOLIN HFA) 108 (90 Base) MCG/ACT inhaler Inhale 1-2 puffs into the lungs every 6 (six) hours as needed  for wheezing or shortness of breath. 09/12/19   [name], MD  ASPIRIN LOW DOSE 81 MG EC tablet TAKE ONE TABLET BY MOUTH DAILY 12/09/20   [name], MD  atorvastatin (LIPITOR) 10 MG tablet Take 1 tablet (10 mg total) by mouth daily. 09/17/20   [name], MD  clindamycin (CLINDAGEL) 1 % gel Apply 1 application topically as needed. For lips    [provider]  ipratropium (ATROVENT) 0.06 % nasal spray Place 1 spray into both nostrils as needed.     [provider]  lithium carbonate (LITHOBID) 300 MG CR tablet Take 2 tablets (600 mg total) by mouth daily. 05/06/21   [name], MD  PARoxetine (PAXIL) 40 MG tablet Take 1.5 tablets (60 mg total) by mouth every morning. 05/06/21   [name], MD  sodium fluoride (FLUORISHIELD) 1.1 % GEL dental gel Place 1 application onto teeth every other day.     [provider]  valACYclovir (VALTREX) 1000 MG tablet Take 1,000 mg by mouth as needed.   08/24/17   [provider]    Family History Family History  Problem Relation Age of Onset   Nephrolithiasis Mother    Skin cancer Mother        from radiation tx   Cancer Mother        ? type   Hyperlipidemia Mother    Irritable bowel syndrome Other    Fibroids Sister    Alcohol abuse Paternal Grandmother    Depression Paternal Grandmother    Suicidality Paternal Aunt    Depression Paternal Aunt    CVA Father 80   Hernia Maternal Grandmother    Other Sister        breast cyst   Skin cancer Sister    Anesthesia problems Neg Hx    Hypotension Neg Hx    Malignant hyperthermia Neg Hx    Pseudochol deficiency Neg Hx    Colon cancer Neg Hx    Colon polyps Neg Hx    Esophageal cancer Neg Hx    Stomach cancer Neg Hx    Rectal cancer Neg Hx     Social History Social History   Tobacco Use   Smoking status: Every Day    Packs/day: 0.50    Years: 30.00    Pack years: 15.00    Types: Cigarettes   Smokeless tobacco: Never  Vaping Use   Vaping Use: Never used  Substance Use Topics   Alcohol use: No   Drug use: No     Allergies   Nitrofurantoin monohyd macro and Sulfa antibiotics   Review of Systems Review of Systems Pertinent findings noted in history of present illness.    Physical Exam Triage Vital Signs ED Triage Vitals  Enc Vitals Group     BP      Pulse      Resp      Temp      Temp src      SpO2      Weight      Height      Head Circumference      Peak Flow      Pain Score      Pain Loc      Pain Edu?      Excl. in [place]?    No data found.  Updated Vital Signs BP (!) 147/85 (BP Location: Right Arm)   Pulse 73   Temp 98.2 F (36.8 C) (Oral)  Resp 18   SpO2 96%   Visual Acuity Right Eye Distance:   Left Eye Distance:   Bilateral Distance:    Right Eye Near:   Left Eye Near:    Bilateral Near:     Physical Exam Vitals and nursing note reviewed.  Constitutional:      Appearance: Normal appearance.  HENT:     Head:  Normocephalic and atraumatic.  Eyes:     Conjunctiva/sclera: Conjunctivae normal.  Cardiovascular:     Rate and Rhythm: Normal rate and regular rhythm.     Pulses: Normal pulses.     Heart sounds: Normal heart sounds.  Pulmonary:     Effort: Pulmonary effort is normal.     Breath sounds: Normal breath sounds.  Abdominal:     General: Abdomen is flat. Bowel sounds are normal.     Palpations: Abdomen is soft.     Tenderness: There is left CVA tenderness. There is no guarding. Negative signs include psoas sign.  Musculoskeletal:        General: Normal range of motion.     Cervical back: Normal range of motion and neck supple.  Skin:    General: Skin is warm and dry.  Neurological:     General: No focal deficit present.     Mental Status: She is alert and oriented to person, place, and time. Mental status is at baseline.  Psychiatric:        Mood and Affect: Mood normal.        Behavior: Behavior normal.     UC Treatments / Results  Labs (all labs ordered are listed, but only abnormal results are displayed) Labs Reviewed  POCT URINALYSIS DIP (MANUAL ENTRY) - Abnormal; Notable for the following components:      Result Value   Color, UA other (*)    Clarity, UA cloudy (*)    Blood, UA large (*)    Leukocytes, UA Trace (*)    All other components within normal limits  URINE CULTURE    EKG   Radiology No results found.  Procedures Procedures (including critical care time)  Medications Ordered in UC Medications - No data to display  Initial Impression / Assessment and Plan / UC Course  I have reviewed the triage vital signs and the nursing notes.  Pertinent labs & imaging results that were available during my care of the patient were reviewed by me and considered in my medical decision making (see chart for details).     Patient advised that we are unable to screen for kidney stones here in the clinic and that if she has concerns she can certainly go to emergency  room for CT imaging or contact her primary care provider and asked them to order it for her.  Patient states she would like to start with antibiotic therapy first and if she continues to have symptoms we will either go to the ED or call her PCP at [facility].  At this time, whether she has kidney stones in addition to a urinary tract infection or just a urinary tract infection, I recommend that she begin antibiotics for empiric coverage.  We will send her urine for culture to evaluate for definitive diagnosis.  I have advised patient that regardless of the outcome of her urine culture, given the presence of blood in her urine, that she should continue a full 7-day course of antibiotics and, if symptoms have not resolved 100% after 7 days, should finish the full 10 days  of Augmentin as prescribed 2-day.  Patient verbalized understanding and agreement of plan as discussed.  All questions were addressed during visit.  Please see discharge instructions below for further details of plan.  Final Clinical Impressions(s) / UC Diagnoses   Final diagnoses:  Vaginal irritation  Gross hematuria  Complicated urinary tract infection  Personal history of sepsis     Discharge Instructions      As we discussed, I am unable to order a CT scan of your abdomen to evaluate for kidney stones.  This can be achieved by either going to the emergency room or calling your primary care provider and requesting an order.  I support your decision to pursue antibiotic therapy first and recommend that you have a low threshold for having the CT scan done if your pain is not improved within the next 3 days.  I have prescribed Augmentin, 1 tablet twice daily for 10 days for presumed urinary tract infection based on the results of your urinalysis today and the symptoms you have described.  Is my recommendation that we treat this infection is a complicated infection given your history of urosepsis requiring hospitalization.  As we  discussed, should your urine culture be inconclusive or not demonstrate a particular organism of infection, I recommend that you at least complete 7 days of Augmentin regardless of the outcome of your urine culture.  And, if your symptoms have not resolved 100% after 7 days, please finish the full 10-day course.  It was a pleasure meeting you today.  Thank you for visiting urgent care.  I hope you feel better soon.  Please reach out to [place] if you need urgent reevaluation, also be sure to follow-up with your primary care provider within the next week to discuss this visit and your progress.     ED Prescriptions     Medication Sig Dispense Auth. Provider   amoxicillin-clavulanate (AUGMENTIN) 875-125 MG tablet Take 1 tablet by mouth every 12 (twelve) hours for 10 days. 20 tablet Lynden Oxford Scales, PA-C      PDMP not reviewed this encounter.   Lynden Oxford Scales, PA-C 05/27/21 1326

## 2021-05-27 NOTE — ED Triage Notes (Signed)
Patient presents to [facility] for evaluation of orange colored urine in the toilet this morning.  Patient states this is what urine in her blood looks like.  C/o lower back pain and lower abdominal/pelvic pain x 3 days.  States hx of kidney stones. States today she has been having fatigue and nausea as well.

## 2021-05-27 NOTE — Discharge Instructions (Signed)
As we discussed, I am unable to order a CT scan of your abdomen to evaluate for kidney stones.  This can be achieved by either going to the emergency room or calling your primary care provider and requesting an order.  I support your decision to pursue antibiotic therapy first and recommend that you have a low threshold for having the CT scan done if your pain is not improved within the next 3 days.  I have prescribed Augmentin, 1 tablet twice daily for 10 days for presumed urinary tract infection based on the results of your urinalysis today and the symptoms you have described.  Is my recommendation that we treat this infection is a complicated infection given your history of urosepsis requiring hospitalization.  As we discussed, should your urine culture be inconclusive or not demonstrate a particular organism of infection, I recommend that you at least complete 7 days of Augmentin regardless of the outcome of your urine culture.  And, if your symptoms have not resolved 100% after 7 days, please finish the full 10-day course.  It was a pleasure meeting you today.  Thank you for visiting urgent care.  I hope you feel better soon.  Please reach out to [place] if you need urgent reevaluation, also be sure to follow-up with your primary care provider within the next week to discuss this visit and your progress.

## 2021-05-28 LAB — URINE CULTURE: Culture: NO GROWTH

## 2021-06-29 LAB — CBC
Hematocrit: 45.2 % (ref 34.0–46.6)
Hemoglobin: 15.6 g/dL (ref 11.1–15.9)
MCH: 29.5 pg (ref 26.6–33.0)
MCHC: 34.5 g/dL (ref 31.5–35.7)
MCV: 86 fL (ref 79–97)
Platelets: 334 10*3/uL (ref 150–450)
RBC: 5.28 x10E6/uL (ref 3.77–5.28)
RDW: 14.1 % (ref 11.7–15.4)
WBC: 15 10*3/uL — ABNORMAL HIGH (ref 3.4–10.8)

## 2021-06-29 LAB — COMPREHENSIVE METABOLIC PANEL
ALT: 25 IU/L (ref 0–32)
AST: 24 IU/L (ref 0–40)
Albumin/Globulin Ratio: 1.9 (ref 1.2–2.2)
Albumin: 4.2 g/dL (ref 3.8–4.9)
Alkaline Phosphatase: 94 IU/L (ref 44–121)
BUN/Creatinine Ratio: 10 (ref 9–23)
BUN: 8 mg/dL (ref 6–24)
Bilirubin Total: 0.3 mg/dL (ref 0.0–1.2)
CO2: 23 mmol/L (ref 20–29)
Calcium: 9 mg/dL (ref 8.7–10.2)
Chloride: 103 mmol/L (ref 96–106)
Creatinine, Ser: 0.8 mg/dL (ref 0.57–1.00)
Globulin, Total: 2.2 g/dL (ref 1.5–4.5)
Glucose: 172 mg/dL — ABNORMAL HIGH (ref 70–99)
Potassium: 3.7 mmol/L (ref 3.5–5.2)
Sodium: 141 mmol/L (ref 134–144)
Total Protein: 6.4 g/dL (ref 6.0–8.5)
eGFR: 86 mL/min/{1.73_m2} (ref >59)

## 2021-06-29 LAB — LITHIUM LEVEL: Lithium Lvl: 0.5 mmol/L (ref 0.5–1.2)

## 2021-06-29 LAB — TSH: TSH: 1.43 u[IU]/mL (ref 0.450–4.500)

## 2021-07-29 ENCOUNTER — Other Ambulatory Visit: Payer: Self-pay

## 2021-07-29 ENCOUNTER — Telehealth (HOSPITAL_BASED_OUTPATIENT_CLINIC_OR_DEPARTMENT_OTHER): Payer: 59 | Admitting: Psychiatry

## 2021-07-29 DIAGNOSIS — F411 Generalized anxiety disorder: Secondary | ICD-10-CM | POA: Diagnosis not present

## 2021-07-29 DIAGNOSIS — F332 Major depressive disorder, recurrent severe without psychotic features: Secondary | ICD-10-CM

## 2021-07-29 MED ORDER — LITHIUM CARBONATE ER 300 MG PO TBCR: 600.0000 mg | EXTENDED_RELEASE_TABLET | Freq: Every day | ORAL | 0 refills | Status: DC

## 2021-07-29 MED ORDER — PAROXETINE HCL 40 MG PO TABS: 60.0000 mg | ORAL_TABLET | ORAL | 0 refills | Status: DC

## 2021-07-29 NOTE — Progress Notes (Signed)
Virtual Visit via Video Note  I connected with Jennifer [name] on 07/29/21 at  3:30 PM EST by a video enabled telemedicine application and verified that I am speaking with the correct person using two identifiers.  Location: Patient: home Provider: office   I discussed the limitations of evaluation and management by telemedicine and the availability of in person appointments. The patient expressed understanding and agreed to proceed.  History of Present Illness: Jennifer [name] is "doing pretty good". She is tired and is not sleeping well. Jennifer [name] is waking up every 1 hr to go the bathroom. She thinks it is due  to the fact she is drinking a lot of fluids. Her depression is ok for the most part. Sometimes she has brief moments of missing her ex- husband and how things should have been. She is able to move past the thoughts quickly. She is happy with her boyfriend. Jennifer [name] denies anhedonia, isolation and anhedonia. She denies SI/HI and passive thoughts of death. Her anxiety is mildly increased due to work stress.    Observations/Objective: Psychiatric Specialty Exam: ROS  There were no vitals taken for this visit.There is no height or weight on file to calculate BMI.  General Appearance: Casual and Neat  Eye Contact:  Good  Speech:  Clear and Coherent and Normal Rate  Volume:  Normal  Mood:  Euthymic  Affect:  Full Range  Thought Process:  Goal Directed, Linear, and Descriptions of Associations: Intact  Orientation:  Full (Time, Place, and Person)  Thought Content:  Logical  Suicidal Thoughts:  No  Homicidal Thoughts:  No  Memory:  Immediate;   Good  Judgement:  Good  Insight:  Good  Psychomotor Activity:  Normal  Concentration:  Concentration: Good  Recall:  Good  Fund of Knowledge:  Good  Language:  Good  Akathisia:  No  Handed:  Right  AIMS (if indicated):     Assets:  Communication Skills Desire for Improvement Financial Resources/Insurance Housing Intimacy Leisure  Time Resilience Social Support Talents/Skills Transportation Vocational/Educational  ADL's:  Intact  Cognition:  WNL  Sleep:        Assessment and Plan: Depression screen [facility] 2/9 07/29/2021 02/18/2021 12/03/2020 09/17/2020 09/12/2019  Decreased Interest 0 0 0 1 1  Down, Depressed, Hopeless 1 1 3 1 1   PHQ - 2 Score 1 1 3 2 2   Altered sleeping - - 3 0 0  Tired, decreased energy - - 3 1 2   Change in appetite - - 0 1 0  Feeling bad or failure about yourself  - - 1 2 1   Trouble concentrating - - 0 0 0  Moving slowly or fidgety/restless - - 0 0 0  Suicidal thoughts - - 0 0 0  PHQ-9 Score - - 10 6 5   Difficult doing work/chores - - Somewhat difficult Somewhat difficult -    Flowsheet Row Video Visit from 07/29/2021 in [facility] from 05/27/2021 in [facility] Video Visit from 02/18/2021 in [facility]  [place] No Risk No Risk No Risk         Reviewed labs done 06/28/21 -Glu 172 and it was not fasting.  BUN and creatinine wnl -CBC shows mild increase in WBC but otherwise normal Lithium 0.5 TSH WNL  09/11/20 lipid panel WNL  1. Severe episode of recurrent major depressive disorder, without psychotic features ([place]) - lithium carbonate (LITHOBID) 300 MG CR tablet; Take 2 tablets (  600 mg total) by mouth daily.  Dispense: 180 tablet; Refill: 0 - PARoxetine (PAXIL) 40 MG tablet; Take 1.5 tablets (60 mg total) by mouth every morning.  Dispense: 135 tablet; Refill: 0  2. GAD (generalized anxiety disorder) - PARoxetine (PAXIL) 40 MG tablet; Take 1.5 tablets (60 mg total) by mouth every morning.  Dispense: 135 tablet; Refill: 0     Follow Up Instructions: In 2-3 months or sooner if needed   I discussed the assessment and treatment plan with the patient. The patient was provided an opportunity to ask questions and all were answered. The patient agreed with the  plan and demonstrated an understanding of the instructions.   The patient was advised to call back or seek an in-person evaluation if the symptoms worsen or if the condition fails to improve as anticipated.  I provided 15 minutes of non-face-to-face time during this encounter.   [name], MD

## 2021-08-18 ENCOUNTER — Other Ambulatory Visit: Payer: Self-pay

## 2021-08-18 DIAGNOSIS — E785 Hyperlipidemia, unspecified: Secondary | ICD-10-CM

## 2021-08-18 DIAGNOSIS — I700 Atherosclerosis of aorta: Secondary | ICD-10-CM

## 2021-08-18 NOTE — Telephone Encounter (Signed)
Last OV 09/17/20, no future appts

## 2021-08-19 ENCOUNTER — Encounter: Payer: Self-pay | Admitting: Family Medicine

## 2021-08-19 MED ORDER — ATORVASTATIN CALCIUM 10 MG PO TABS: 10.0000 mg | ORAL_TABLET | Freq: Every day | ORAL | 0 refills | Status: DC

## 2021-08-19 NOTE — Telephone Encounter (Signed)
Called pt and sent mychart letter

## 2021-08-19 NOTE — Telephone Encounter (Signed)
Patient due for office visit with PCP in February 2023. Please schedule.

## 2021-08-20 NOTE — Telephone Encounter (Signed)
2nd attempt  LMTCB to schedule

## 2021-09-07 DIAGNOSIS — R87629 Unspecified abnormal cytological findings in specimens from vagina: Secondary | ICD-10-CM | POA: Insufficient documentation

## 2021-09-07 DIAGNOSIS — N393 Stress incontinence (female) (male): Secondary | ICD-10-CM | POA: Insufficient documentation

## 2021-10-28 ENCOUNTER — Other Ambulatory Visit: Payer: Self-pay

## 2021-10-28 ENCOUNTER — Telehealth (HOSPITAL_BASED_OUTPATIENT_CLINIC_OR_DEPARTMENT_OTHER): Payer: 59 | Admitting: Psychiatry

## 2021-10-28 DIAGNOSIS — F411 Generalized anxiety disorder: Secondary | ICD-10-CM | POA: Diagnosis not present

## 2021-10-28 DIAGNOSIS — F332 Major depressive disorder, recurrent severe without psychotic features: Secondary | ICD-10-CM

## 2021-10-28 MED ORDER — PAROXETINE HCL 40 MG PO TABS: 60.0000 mg | ORAL_TABLET | ORAL | 0 refills | Status: DC

## 2021-10-28 MED ORDER — LITHIUM CARBONATE ER 300 MG PO TBCR: 600.0000 mg | EXTENDED_RELEASE_TABLET | Freq: Every day | ORAL | 0 refills | Status: DC

## 2021-10-28 NOTE — Progress Notes (Signed)
Virtual Visit via Video Note ? ?I connected with Jennifer [name] on 10/28/21 at  3:30 PM EDT by a video enabled telemedicine application and verified that I am speaking with the correct person using two identifiers. ? ?Location: ?Patient: home ?Provider: office ?  ?I discussed the limitations of evaluation and management by telemedicine and the availability of in person appointments. The patient expressed understanding and agreed to proceed. ? ?History of Present Illness: ?"It's been shaky". A lot of stuff has happened and she has been feeling depressed these past few months. It was the anniversary of her husband's death on 09-23-22. She was depressed for 1 week but has since recovered. Her aunt passed away last week and that was hard of Jennifer [name]. Work has stressful due to a co-worker being out. Jennifer [name] has had to more work because of that. Her mom has a spinal bleed and has not been able to drive or do many chores. Jennifer [name] and her sister have been helping a lot. Her dad collapsed and ended up in the hospital recently. She is sick and thinks it might be COVID but has been working thru it. [name] recently had a tooth pulled and her sinus cavity was nicked in the process. She is recovering slowly. A good thing that happened recently is Jennifer [name] has adopted an abused dog. She worked with him and now he loves her. [place] is so happy with their progress. Jennifer [name] has been depressed nearly everyday for the last several weeks. She is endorsing anhedonia. She has not slept well since her aunt passed and is exhausted. Jennifer [name] is endorsing irritability and worthlessness. She has a few days of wishing to be dead and the last time was earlier today. [name] denies SI/HI. She doesn't feel she needs inpatient psych hospitalization.  ?  ?Observations/Objective: ?Psychiatric Specialty Exam: ?ROS  ?There were no vitals taken for this visit.There is no height or weight on file to calculate BMI.  ?General Appearance: Casual  ?Eye  Contact:  Good  ?Speech:  Clear and Coherent and Normal Rate  ?Volume:  Normal  ?Mood:  Depressed  ?Affect:  Full Range  ?Thought Process:  Coherent and Descriptions of Associations: Circumstantial  ?Orientation:  Full (Time, Place, and Person)  ?Thought Content:  Rumination  ?Suicidal Thoughts:  No  ?Homicidal Thoughts:  No  ?Memory:  Immediate;   Good  ?Judgement:  Good  ?Insight:  Good  ?Psychomotor Activity:  Normal  ?Concentration:  Concentration: Good  ?Recall:  Good  ?Fund of Knowledge:  Good  ?Language:  Good  ?Akathisia:  No  ?Handed:  Right  ?AIMS (if indicated):     ?Assets:  Communication Skills ?Desire for Improvement ?Financial Resources/Insurance ?Housing ?Resilience ?Social Support ?Talents/Skills ?Transportation ?Vocational/Educational  ?ADL's:  Intact  ?Cognition:  WNL  ?Sleep:     ? ? ? ?Assessment and Plan: ? ?  10/28/2021  ?  3:41 PM 07/29/2021  ?  3:45 PM 02/18/2021  ?  3:43 PM 12/03/2020  ?  3:44 PM 09/17/2020  ?  5:11 PM  ?Depression screen PHQ 2/9  ?Decreased Interest 3 0 0 0 1  ?Down, Depressed, Hopeless '3 1 1 3 1  '$ ?PHQ - 2 Score '6 1 1 3 2  '$ ?Altered sleeping 3   3 0  ?Tired, decreased energy '3   3 1  '$ ?Change in appetite 0   0 1  ?Feeling bad or failure about yourself  '3   1 2  '$ ?Trouble concentrating 0   0 0  ?  Moving slowly or fidgety/restless 0   0 0  ?Suicidal thoughts 1   0 0  ?PHQ-9 Score '16   10 6  '$ ?Difficult doing work/chores Very difficult   Somewhat difficult Somewhat difficult  ? ? ?Flowsheet Row Video Visit from 10/28/2021 in [facility] Video Visit from 07/29/2021 in [facility] from 05/27/2021 in [facility] [place]  ?C-SSRS RISK CATEGORY No Risk No Risk No Risk  ? ?  ? ?Nella is going to work on self care. ? ?She does not want her meds changed at this time.  ? ?1. Severe episode of recurrent major depressive disorder, without psychotic features ([place]) ?- PARoxetine (PAXIL) 40  MG tablet; Take 1.5 tablets (60 mg total) by mouth every morning.  Dispense: 135 tablet; Refill: 0 ?- lithium carbonate (LITHOBID) 300 MG CR tablet; Take 2 tablets (600 mg total) by mouth daily.  Dispense: 180 tablet; Refill: 0 ? ?2. GAD (generalized anxiety disorder) ?- PARoxetine (PAXIL) 40 MG tablet; Take 1.5 tablets (60 mg total) by mouth every morning.  Dispense: 135 tablet; Refill: 0 ? ? ? ?Follow Up Instructions: ?In 1-2 months or sooner if needed ?  ? ?Collaboration of Care: Other none today ? ?Patient/Guardian was advised Release of Information must be obtained prior to any record release in order to collaborate their care with an outside provider. Patient/Guardian was advised if they have not already done so to contact the registration department to sign all necessary forms in order for [place] to release information regarding their care.  ? ?Consent: Patient/Guardian gives verbal consent for treatment and assignment of benefits for services provided during this visit. Patient/Guardian expressed understanding and agreed to proceed.  ? ?I discussed the assessment and treatment plan with the patient. The patient was provided an opportunity to ask questions and all were answered. The patient agreed with the plan and demonstrated an understanding of the instructions. ?  ?The patient was advised to call back or seek an in-person evaluation if the symptoms worsen or if the condition fails to improve as anticipated. ? ?I provided 21 minutes of non-face-to-face time during this encounter. ? ? ?[name], MD ? ?

## 2021-11-25 ENCOUNTER — Telehealth (HOSPITAL_BASED_OUTPATIENT_CLINIC_OR_DEPARTMENT_OTHER): Payer: 59 | Admitting: Psychiatry

## 2021-11-25 DIAGNOSIS — F332 Major depressive disorder, recurrent severe without psychotic features: Secondary | ICD-10-CM

## 2021-11-25 DIAGNOSIS — F411 Generalized anxiety disorder: Secondary | ICD-10-CM | POA: Diagnosis not present

## 2021-11-25 MED ORDER — LITHIUM CARBONATE ER 450 MG PO TBCR: 450.0000 mg | EXTENDED_RELEASE_TABLET | Freq: Two times a day (BID) | ORAL | 0 refills | Status: DC

## 2021-11-25 NOTE — Progress Notes (Signed)
Virtual Visit via Video Note ? ?I connected with Jennifer [name] on 11/25/21 at  3:15 PM EDT by a video enabled telemedicine application and verified that I am speaking with the correct person using two identifiers. ? ?Location: ?Patient: home ?Provider: office ?  ?I discussed the limitations of evaluation and management by telemedicine and the availability of in person appointments. The patient expressed understanding and agreed to proceed. ? ?History of Present Illness: ? Jennifer [name] shares that for about 2 weeks after our last visit she was down. After that things have improved. In the last 14 days she has been depressed about 10 days. Things were continuing to improve until her mom was diagnosed with a lung nodule. She is worried about her mom and dad. Jennifer [name] feels her family is "disintegrating". Jennifer [name] is struggling but feels she is managing as best she can. Jennifer [name] doesn't feel nervous but more overwhelmed. Jennifer [name] finds herself shaking. She has been going to her parents house several times a week to help with chores. Jennifer [name] really wants to spend her day in bed and isolate. Most days she is going to work and doing anything else expected of her. Her sleep is fair but she is always tired. She is endorsing some anhedonia. Her appetite is mildly decreased and her portion size of meals has decreased. She denies SI/HI. Jennifer [name] has been questioning "what the point is". The first week in May she is going to the beach with her boyfriend. She is really looking forward to it.  ?  ?Observations/Objective: ?Psychiatric Specialty Exam: ?ROS  ?There were no vitals taken for this visit.There is no height or weight on file to calculate BMI.  ?General Appearance: Casual  ?Eye Contact:  Good  ?Speech:  Clear and Coherent and Normal Rate  ?Volume:  Normal  ?Mood:  Anxious and Depressed  ?Affect:  Congruent  ?Thought Process:  Goal Directed, Linear, and Descriptions of Associations: Intact  ?Orientation:  Full (Time, Place, and  Person)  ?Thought Content:  Logical  ?Suicidal Thoughts:  No  ?Homicidal Thoughts:  No  ?Memory:  Immediate;   Good  ?Judgement:  Good  ?Insight:  Good  ?Psychomotor Activity:  Normal  ?Concentration:  Concentration: Good  ?Recall:  Good  ?Fund of Knowledge:  Good  ?Language:  Good  ?Akathisia:  No  ?Handed:  Right  ?AIMS (if indicated):     ?Assets:  Communication Skills ?Desire for Improvement ?Financial Resources/Insurance ?Housing ?Intimacy ?Leisure Time ?Resilience ?Social Support ?Talents/Skills ?Transportation ?Vocational/Educational  ?ADL's:  Intact  ?Cognition:  WNL  ?Sleep:     ? ? ? ?Assessment and Plan: ? ? ?  11/25/2021  ?  3:28 PM 10/28/2021  ?  3:41 PM 07/29/2021  ?  3:45 PM 02/18/2021  ?  3:43 PM 12/03/2020  ?  3:44 PM  ?Depression screen PHQ 2/9  ?Decreased Interest 3 3 0 0 0  ?Down, Depressed, Hopeless '3 3 1 1 3  '$ ?PHQ - 2 Score '6 6 1 1 3  '$ ?Altered sleeping 0 3   3  ?Tired, decreased energy '3 3   3  '$ ?Change in appetite 2 0   0  ?Feeling bad or failure about yourself  '3 3   1  '$ ?Trouble concentrating 0 0   0  ?Moving slowly or fidgety/restless 0 0   0  ?Suicidal thoughts 0 1   0  ?PHQ-9 Score '14 16   10  '$ ?Difficult doing work/chores Very difficult Very difficult   Somewhat difficult  ? ? ?  Flowsheet Row Video Visit from 11/25/2021 in [facility] Video Visit from 10/28/2021 in [facility] Video Visit from 07/29/2021 in [facility]  ?C-SSRS RISK CATEGORY Error: Q3, 4, or 5 should not be populated when Q2 is No No Risk No Risk  ? ?  ? ? ? ? ?Status of current problems: ongoing depression and anxiety ? ?Meds:  ?Increase Lithobid '450mg'$  po BID  ? Continue Paxil '60mg'$  po qD ? ?1. Severe episode of recurrent major depressive disorder, without psychotic features ([place]) ?- lithium carbonate (ESKALITH) 450 MG CR tablet; Take 1 tablet (450 mg total) by mouth 2 (two) times daily.  Dispense: 180 tablet;  Refill: 0 ? ? ? ?Therapy: brief supportive therapy provided. Discussed psychosocial stressors in detail.  ? ? ?Collaboration of Care: Other none today ? ?Patient/Guardian was advised Release of Information must be obtained prior to any record release in order to collaborate their care with an outside provider. Patient/Guardian was advised if they have not already done so to contact the registration department to sign all necessary forms in order for [place] to release information regarding their care.  ? ?Consent: Patient/Guardian gives verbal consent for treatment and assignment of benefits for services provided during this visit. Patient/Guardian expressed understanding and agreed to proceed.  ? ? ?Follow Up Instructions: ?Follow up in 2-3 weeks or sooner if needed ?  ? ?I discussed the assessment and treatment plan with the patient. The patient was provided an opportunity to ask questions and all were answered. The patient agreed with the plan and demonstrated an understanding of the instructions. ?  ?The patient was advised to call back or seek an in-person evaluation if the symptoms worsen or if the condition fails to improve as anticipated. ? ?I provided 15 minutes of non-face-to-face time during this encounter. ? ? ?[name], MD ? ?

## 2021-12-10 ENCOUNTER — Other Ambulatory Visit: Payer: Self-pay | Admitting: Family Medicine

## 2021-12-10 DIAGNOSIS — E785 Hyperlipidemia, unspecified: Secondary | ICD-10-CM

## 2021-12-10 DIAGNOSIS — I700 Atherosclerosis of aorta: Secondary | ICD-10-CM

## 2021-12-16 ENCOUNTER — Telehealth (HOSPITAL_BASED_OUTPATIENT_CLINIC_OR_DEPARTMENT_OTHER): Payer: 59 | Admitting: Psychiatry

## 2021-12-16 DIAGNOSIS — F411 Generalized anxiety disorder: Secondary | ICD-10-CM | POA: Diagnosis not present

## 2021-12-16 DIAGNOSIS — F332 Major depressive disorder, recurrent severe without psychotic features: Secondary | ICD-10-CM

## 2021-12-16 MED ORDER — PAROXETINE HCL 40 MG PO TABS: 60.0000 mg | ORAL_TABLET | ORAL | 0 refills | Status: DC

## 2021-12-16 NOTE — Progress Notes (Signed)
Virtual Visit via Video Note ? ?I connected with Jennifer [name] on 12/16/21 at  4:00 PM EDT by a video enabled telemedicine application and verified that I am speaking with the correct person using two identifiers. ? ?Location: ?Patient: home ?Provider: office ?  ?I discussed the limitations of evaluation and management by telemedicine and the availability of in person appointments. The patient expressed understanding and agreed to proceed. ? ?History of Present Illness: ?"I am feeling much  better".  It began earlier this week where she felt less stressed and happier. She took a vacation with her boyfriend about 2 weeks ago and it really helped to get away from work and other stressors. She still thinks about her aunt who recently passed. Her mom has been diagnosed with cancer. A lot of things are going on but she is handling everything better. The anxiety improved so that she was no longer fixated stressors and able to enjoy herself. She has ongoing worries about her mother's health. She continues to grieve her aunt and is missing her a lot. Her sleep is good and her appetite is fair. She denies SI/HI. The increase in Lithium has helped and she denies SE.  ?  ?Observations/Objective: ?Psychiatric Specialty Exam: ?ROS  ?There were no vitals taken for this visit.There is no height or weight on file to calculate BMI.  ?General Appearance: Casual  ?Eye Contact:  Good  ?Speech:  Clear and Coherent and Normal Rate  ?Volume:  Normal  ?Mood:  Depressed  ?Affect:  Full Range  ?Thought Process:  Goal Directed, Linear, and Descriptions of Associations: Intact  ?Orientation:  Full (Time, Place, and Person)  ?Thought Content:  Logical  ?Suicidal Thoughts:  No  ?Homicidal Thoughts:  No  ?Memory:  Immediate;   Good  ?Judgement:  Good  ?Insight:  Good  ?Psychomotor Activity:  Normal  ?Concentration:  Concentration: Good  ?Recall:  Good  ?Fund of Knowledge:  Good  ?Language:  Good  ?Akathisia:  No  ?Handed:  Right  ?AIMS (if  indicated):     ?Assets:  Communication Skills ?Desire for Improvement ?Financial Resources/Insurance ?Housing ?Intimacy ?Leisure Time ?Resilience ?Social Support ?Talents/Skills ?Transportation ?Vocational/Educational  ?ADL's:  Intact  ?Cognition:  WNL  ?Sleep:     ? ? ? ?Assessment and Plan: ? ? ?  12/16/2021  ?  4:12 PM 11/25/2021  ?  3:28 PM 10/28/2021  ?  3:41 PM 07/29/2021  ?  3:45 PM 02/18/2021  ?  3:43 PM  ?Depression screen PHQ 2/9  ?Decreased Interest 0 3 3 0 0  ?Down, Depressed, Hopeless '1 3 3 1 1  '$ ?PHQ - 2 Score '1 6 6 1 1  '$ ?Altered sleeping 0 0 3    ?Tired, decreased energy '1 3 3    '$ ?Change in appetite 0 2 0    ?Feeling bad or failure about yourself  '1 3 3    '$ ?Trouble concentrating 0 0 0    ?Moving slowly or fidgety/restless 0 0 0    ?Suicidal thoughts 0 0 1    ?PHQ-9 Score '3 14 16    '$ ?Difficult doing work/chores Very difficult Very difficult Very difficult    ? ? ?Flowsheet Row Video Visit from 12/16/2021 in [facility] Video Visit from 11/25/2021 in [facility] Video Visit from 10/28/2021 in [facility]  ?C-SSRS RISK CATEGORY Error: Q3, 4, or 5 should not be populated when Q2 is No Error: Q3, 4, or  5 should not be populated when Q2 is No No Risk  ? ?  ? ? ? ? ? ?Status of current problems: improved depression and anxiet ? ?Meds:  ?Continue Lithium '450mg'$  po BID- no refill today ?1. Severe episode of recurrent major depressive disorder, without psychotic features ([place]) ?- PARoxetine (PAXIL) 40 MG tablet; Take 1.5 tablets (60 mg total) by mouth every morning.  Dispense: 135 tablet; Refill: 0 ? ?2. GAD (generalized anxiety disorder) ?- PARoxetine (PAXIL) 40 MG tablet; Take 1.5 tablets (60 mg total) by mouth every morning.  Dispense: 135 tablet; Refill: 0 ? ? ? ? ?Labs: I will order labs at our next visit  ? ? ?Therapy: brief supportive therapy provided. Discussed psychosocial stressors in  detail.  ? ? ?Collaboration of Care: Other none today ? ?Patient/Guardian was advised Release of Information must be obtained prior to any record release in order to collaborate their care with an outside provider. Patient/Guardian was advised if they have not already done so to contact the registration department to sign all necessary forms in order for [place] to release information regarding their care.  ? ?Consent: Patient/Guardian gives verbal consent for treatment and assignment of benefits for services provided during this visit. Patient/Guardian expressed understanding and agreed to proceed.  ? ? ? ?Follow Up Instructions: ?Follow up in 3 months  or sooner if needed ?  ? ?I discussed the assessment and treatment plan with the patient. The patient was provided an opportunity to ask questions and all were answered. The patient agreed with the plan and demonstrated an understanding of the instructions. ?  ?The patient was advised to call back or seek an in-person evaluation if the symptoms worsen or if the condition fails to improve as anticipated. ? ?I provided 14 minutes of non-face-to-face time during this encounter. ? ? ?[name], MD ? ?

## 2021-12-26 ENCOUNTER — Emergency Department (HOSPITAL_COMMUNITY): Payer: Managed Care, Other (non HMO)

## 2021-12-26 ENCOUNTER — Encounter (HOSPITAL_COMMUNITY)
Admission: EM | Disposition: A | Discharge status: Home / Self Care | Payer: Self-pay | Source: Home / Self Care | Attending: Pulmonary Disease

## 2021-12-26 ENCOUNTER — Other Ambulatory Visit: Payer: Self-pay

## 2021-12-26 ENCOUNTER — Encounter (HOSPITAL_COMMUNITY): Payer: Self-pay | Admitting: Emergency Medicine

## 2021-12-26 ENCOUNTER — Inpatient Hospital Stay (HOSPITAL_COMMUNITY): Payer: Managed Care, Other (non HMO) | Admitting: Anesthesiology

## 2021-12-26 ENCOUNTER — Inpatient Hospital Stay (HOSPITAL_COMMUNITY): Payer: Managed Care, Other (non HMO)

## 2021-12-26 ENCOUNTER — Inpatient Hospital Stay (HOSPITAL_COMMUNITY)
Admission: EM | Admit: 2021-12-26 | Discharge: 2021-12-29 | DRG: 853 | Disposition: A | Discharge status: Home / Self Care | Payer: Managed Care, Other (non HMO) | Attending: Internal Medicine | Admitting: Internal Medicine

## 2021-12-26 DIAGNOSIS — Z83438 Family history of other disorder of lipoprotein metabolism and other lipidemia: Secondary | ICD-10-CM

## 2021-12-26 DIAGNOSIS — K760 Fatty (change of) liver, not elsewhere classified: Secondary | ICD-10-CM | POA: Diagnosis present

## 2021-12-26 DIAGNOSIS — Z87442 Personal history of urinary calculi: Secondary | ICD-10-CM

## 2021-12-26 DIAGNOSIS — Z818 Family history of other mental and behavioral disorders: Secondary | ICD-10-CM

## 2021-12-26 DIAGNOSIS — N3001 Acute cystitis with hematuria: Secondary | ICD-10-CM

## 2021-12-26 DIAGNOSIS — Z882 Allergy status to sulfonamides status: Secondary | ICD-10-CM | POA: Diagnosis not present

## 2021-12-26 DIAGNOSIS — E669 Obesity, unspecified: Secondary | ICD-10-CM | POA: Diagnosis present

## 2021-12-26 DIAGNOSIS — Z888 Allergy status to other drugs, medicaments and biological substances status: Secondary | ICD-10-CM | POA: Diagnosis not present

## 2021-12-26 DIAGNOSIS — E872 Acidosis, unspecified: Secondary | ICD-10-CM | POA: Diagnosis present

## 2021-12-26 DIAGNOSIS — Z881 Allergy status to other antibiotic agents status: Secondary | ICD-10-CM | POA: Diagnosis not present

## 2021-12-26 DIAGNOSIS — Z66 Do not resuscitate: Secondary | ICD-10-CM | POA: Diagnosis present

## 2021-12-26 DIAGNOSIS — F17210 Nicotine dependence, cigarettes, uncomplicated: Secondary | ICD-10-CM | POA: Diagnosis present

## 2021-12-26 DIAGNOSIS — A419 Sepsis, unspecified organism: Principal | ICD-10-CM | POA: Diagnosis present

## 2021-12-26 DIAGNOSIS — N201 Calculus of ureter: Secondary | ICD-10-CM | POA: Diagnosis not present

## 2021-12-26 DIAGNOSIS — N132 Hydronephrosis with renal and ureteral calculous obstruction: Secondary | ICD-10-CM

## 2021-12-26 DIAGNOSIS — Z79899 Other long term (current) drug therapy: Secondary | ICD-10-CM

## 2021-12-26 DIAGNOSIS — E785 Hyperlipidemia, unspecified: Secondary | ICD-10-CM | POA: Diagnosis present

## 2021-12-26 DIAGNOSIS — K5790 Diverticulosis of intestine, part unspecified, without perforation or abscess without bleeding: Secondary | ICD-10-CM | POA: Diagnosis present

## 2021-12-26 DIAGNOSIS — R6521 Severe sepsis with septic shock: Secondary | ICD-10-CM | POA: Diagnosis present

## 2021-12-26 DIAGNOSIS — Z6832 Body mass index (BMI) 32.0-32.9, adult: Secondary | ICD-10-CM

## 2021-12-26 DIAGNOSIS — R4182 Altered mental status, unspecified: Secondary | ICD-10-CM

## 2021-12-26 DIAGNOSIS — Z87440 Personal history of urinary (tract) infections: Secondary | ICD-10-CM

## 2021-12-26 DIAGNOSIS — F32A Depression, unspecified: Secondary | ICD-10-CM | POA: Diagnosis present

## 2021-12-26 DIAGNOSIS — E876 Hypokalemia: Secondary | ICD-10-CM | POA: Diagnosis present

## 2021-12-26 DIAGNOSIS — N136 Pyonephrosis: Secondary | ICD-10-CM | POA: Diagnosis present

## 2021-12-26 DIAGNOSIS — B370 Candidal stomatitis: Secondary | ICD-10-CM | POA: Diagnosis present

## 2021-12-26 DIAGNOSIS — N179 Acute kidney failure, unspecified: Secondary | ICD-10-CM

## 2021-12-26 DIAGNOSIS — Z20822 Contact with and (suspected) exposure to covid-19: Secondary | ICD-10-CM | POA: Diagnosis present

## 2021-12-26 DIAGNOSIS — R739 Hyperglycemia, unspecified: Secondary | ICD-10-CM | POA: Diagnosis present

## 2021-12-26 DIAGNOSIS — E860 Dehydration: Secondary | ICD-10-CM | POA: Diagnosis present

## 2021-12-26 DIAGNOSIS — A4151 Sepsis due to Escherichia coli [E. coli]: Principal | ICD-10-CM | POA: Diagnosis present

## 2021-12-26 DIAGNOSIS — Z7982 Long term (current) use of aspirin: Secondary | ICD-10-CM

## 2021-12-26 HISTORY — PX: CYSTOSCOPY W/ URETERAL STENT PLACEMENT: SHX1429

## 2021-12-26 LAB — COMPREHENSIVE METABOLIC PANEL
ALT: 21 U/L (ref 0–44)
AST: 39 U/L (ref 15–41)
Albumin: 3.5 g/dL (ref 3.5–5.0)
Alkaline Phosphatase: 110 U/L (ref 38–126)
Anion gap: 14 (ref 5–15)
BUN: 7 mg/dL (ref 6–20)
CO2: 17 mmol/L — ABNORMAL LOW (ref 22–32)
Calcium: 8.7 mg/dL — ABNORMAL LOW (ref 8.9–10.3)
Chloride: 106 mmol/L (ref 98–111)
Creatinine, Ser: 1.22 mg/dL — ABNORMAL HIGH (ref 0.44–1.00)
GFR, Estimated: 51 mL/min — ABNORMAL LOW (ref >60)
Glucose, Bld: 140 mg/dL — ABNORMAL HIGH (ref 70–99)
Potassium: 3 mmol/L — ABNORMAL LOW (ref 3.5–5.1)
Sodium: 137 mmol/L (ref 135–145)
Total Bilirubin: 1.5 mg/dL — ABNORMAL HIGH (ref 0.3–1.2)
Total Protein: 6.5 g/dL (ref 6.5–8.1)

## 2021-12-26 LAB — URINALYSIS, ROUTINE W REFLEX MICROSCOPIC
Bilirubin Urine: NEGATIVE
Glucose, UA: NEGATIVE mg/dL
Ketones, ur: NEGATIVE mg/dL
Nitrite: POSITIVE — AB
Protein, ur: NEGATIVE mg/dL
Specific Gravity, Urine: 1.011 (ref 1.005–1.030)
WBC, UA: >50 WBC/hpf — ABNORMAL HIGH (ref 0–5)
pH: 6 (ref 5.0–8.0)

## 2021-12-26 LAB — I-STAT CHEM 8, ED
BUN: 6 mg/dL (ref 6–20)
Calcium, Ion: 0.96 mmol/L — ABNORMAL LOW (ref 1.15–1.40)
Chloride: 104 mmol/L (ref 98–111)
Creatinine, Ser: 1 mg/dL (ref 0.44–1.00)
Glucose, Bld: 138 mg/dL — ABNORMAL HIGH (ref 70–99)
HCT: 47 % — ABNORMAL HIGH (ref 36.0–46.0)
Hemoglobin: 16 g/dL — ABNORMAL HIGH (ref 12.0–15.0)
Potassium: 2.9 mmol/L — ABNORMAL LOW (ref 3.5–5.1)
Sodium: 137 mmol/L (ref 135–145)
TCO2: 17 mmol/L — ABNORMAL LOW (ref 22–32)

## 2021-12-26 LAB — CBC WITH DIFFERENTIAL/PLATELET
Abs Immature Granulocytes: 0.15 10*3/uL — ABNORMAL HIGH (ref 0.00–0.07)
Basophils Absolute: 0 10*3/uL (ref 0.0–0.1)
Basophils Relative: 0 %
Eosinophils Absolute: 0 10*3/uL (ref 0.0–0.5)
Eosinophils Relative: 0 %
HCT: 47.2 % — ABNORMAL HIGH (ref 36.0–46.0)
Hemoglobin: 16.5 g/dL — ABNORMAL HIGH (ref 12.0–15.0)
Immature Granulocytes: 2 %
Lymphocytes Relative: 9 %
Lymphs Abs: 0.9 10*3/uL (ref 0.7–4.0)
MCH: 30.4 pg (ref 26.0–34.0)
MCHC: 35 g/dL (ref 30.0–36.0)
MCV: 86.9 fL (ref 80.0–100.0)
Monocytes Absolute: 0 10*3/uL — ABNORMAL LOW (ref 0.1–1.0)
Monocytes Relative: 0 %
Neutro Abs: 9.2 10*3/uL — ABNORMAL HIGH (ref 1.7–7.7)
Neutrophils Relative %: 89 %
Platelets: 243 10*3/uL (ref 150–400)
RBC: 5.43 MIL/uL — ABNORMAL HIGH (ref 3.87–5.11)
RDW: 15.9 % — ABNORMAL HIGH (ref 11.5–15.5)
WBC: 10.3 10*3/uL (ref 4.0–10.5)
nRBC: 0 % (ref 0.0–0.2)

## 2021-12-26 LAB — RESP PANEL BY RT-PCR (FLU A&B, COVID) ARPGX2
Influenza A by PCR: NEGATIVE
Influenza B by PCR: NEGATIVE
SARS Coronavirus 2 by RT PCR: NEGATIVE

## 2021-12-26 LAB — PROTIME-INR
INR: 1.1 (ref 0.8–1.2)
Prothrombin Time: 13.8 seconds (ref 11.4–15.2)

## 2021-12-26 LAB — TROPONIN I (HIGH SENSITIVITY)
Troponin I (High Sensitivity): 7 ng/L (ref <18)
Troponin I (High Sensitivity): 8 ng/L (ref <18)

## 2021-12-26 LAB — MRSA NEXT GEN BY PCR, NASAL: MRSA by PCR Next Gen: NOT DETECTED

## 2021-12-26 LAB — I-STAT BETA HCG BLOOD, ED (MC, WL, AP ONLY): I-stat hCG, quantitative: 5.8 m[IU]/mL — ABNORMAL HIGH (ref <5)

## 2021-12-26 LAB — LIPASE, BLOOD: Lipase: 26 U/L (ref 11–51)

## 2021-12-26 LAB — LACTIC ACID, PLASMA
Lactic Acid, Venous: 5.6 mmol/L (ref 0.5–1.9)
Lactic Acid, Venous: 4.3 mmol/L (ref 0.5–1.9)

## 2021-12-26 LAB — GLUCOSE, CAPILLARY: Glucose-Capillary: 186 mg/dL — ABNORMAL HIGH (ref 70–99)

## 2021-12-26 LAB — APTT: aPTT: 24 seconds (ref 24–36)

## 2021-12-26 SURGERY — CYSTOSCOPY, WITH RETROGRADE PYELOGRAM AND URETERAL STENT INSERTION
Anesthesia: General | Laterality: Left

## 2021-12-26 MED ORDER — POTASSIUM CHLORIDE 10 MEQ/100ML IV SOLN
10.0000 meq | INTRAVENOUS | Status: AC
  Administered 2021-12-26 (×2): 10 meq via INTRAVENOUS
  Filled 2021-12-26 (×2): qty 100

## 2021-12-26 MED ORDER — VANCOMYCIN HCL 2000 MG/400ML IV SOLN
2000.0000 mg | Freq: Once | INTRAVENOUS | Status: AC
  Administered 2021-12-26: 2000 mg via INTRAVENOUS
  Filled 2021-12-26: qty 400

## 2021-12-26 MED ORDER — IOHEXOL 300 MG/ML  SOLN
100.0000 mL | Freq: Once | INTRAMUSCULAR | Status: AC | PRN
  Administered 2021-12-26: 100 mL via INTRAVENOUS

## 2021-12-26 MED ORDER — LIDOCAINE HCL (CARDIAC) PF 100 MG/5ML IV SOSY
PREFILLED_SYRINGE | INTRAVENOUS | Status: DC | PRN
  Administered 2021-12-26: 60 mg via INTRAVENOUS

## 2021-12-26 MED ORDER — FENTANYL CITRATE (PF) 100 MCG/2ML IJ SOLN
INTRAMUSCULAR | Status: DC | PRN
  Administered 2021-12-26: 50 ug via INTRAVENOUS

## 2021-12-26 MED ORDER — MIDAZOLAM HCL 2 MG/2ML IJ SOLN
INTRAMUSCULAR | Status: AC
  Filled 2021-12-26: qty 2

## 2021-12-26 MED ORDER — MIDAZOLAM HCL 5 MG/5ML IJ SOLN
INTRAMUSCULAR | Status: DC | PRN
  Administered 2021-12-26: 1 mg via INTRAVENOUS

## 2021-12-26 MED ORDER — POLYETHYLENE GLYCOL 3350 17 G PO PACK: 17.0000 g | PACK | Freq: Every day | ORAL | Status: DC | PRN

## 2021-12-26 MED ORDER — SUCCINYLCHOLINE CHLORIDE 200 MG/10ML IV SOSY
PREFILLED_SYRINGE | INTRAVENOUS | Status: DC | PRN
  Administered 2021-12-26: 120 mg via INTRAVENOUS

## 2021-12-26 MED ORDER — FENTANYL CITRATE (PF) 100 MCG/2ML IJ SOLN: 25.0000 ug | INTRAMUSCULAR | Status: DC | PRN

## 2021-12-26 MED ORDER — LACTATED RINGERS IV BOLUS (SEPSIS)
1000.0000 mL | Freq: Once | INTRAVENOUS | Status: AC
  Administered 2021-12-26: 1000 mL via INTRAVENOUS

## 2021-12-26 MED ORDER — TRAMADOL HCL 50 MG PO TABS: 50.0000 mg | ORAL_TABLET | Freq: Four times a day (QID) | ORAL | 0 refills | Status: DC | PRN

## 2021-12-26 MED ORDER — LITHIUM CARBONATE 300 MG PO CAPS
450.0000 mg | ORAL_CAPSULE | Freq: Two times a day (BID) | ORAL | Status: DC
  Administered 2021-12-27 – 2021-12-29 (×5): 450 mg via ORAL
  Filled 2021-12-26 (×6): qty 1

## 2021-12-26 MED ORDER — SODIUM CHLORIDE 0.9 % IR SOLN
Status: DC | PRN
  Administered 2021-12-26: 3000 mL

## 2021-12-26 MED ORDER — OXYCODONE HCL 5 MG PO TABS: 5.0000 mg | ORAL_TABLET | ORAL | Status: DC | PRN

## 2021-12-26 MED ORDER — FENTANYL CITRATE PF 50 MCG/ML IJ SOSY: 25.0000 ug | PREFILLED_SYRINGE | INTRAMUSCULAR | Status: DC | PRN

## 2021-12-26 MED ORDER — SODIUM CHLORIDE 0.9 % IV SOLN
2.0000 g | Freq: Once | INTRAVENOUS | Status: AC
  Administered 2021-12-26: 2 g via INTRAVENOUS
  Filled 2021-12-26: qty 12.5

## 2021-12-26 MED ORDER — STERILE WATER FOR INJECTION IV SOLN
INTRAVENOUS | Status: DC
  Filled 2021-12-26: qty 1000

## 2021-12-26 MED ORDER — FENTANYL CITRATE (PF) 250 MCG/5ML IJ SOLN
INTRAMUSCULAR | Status: AC
  Filled 2021-12-26: qty 5

## 2021-12-26 MED ORDER — DEXAMETHASONE SODIUM PHOSPHATE 10 MG/ML IJ SOLN
INTRAMUSCULAR | Status: DC | PRN
  Administered 2021-12-26: 10 mg via INTRAVENOUS

## 2021-12-26 MED ORDER — VANCOMYCIN HCL IN DEXTROSE 1-5 GM/200ML-% IV SOLN
1000.0000 mg | Freq: Once | INTRAVENOUS | Status: DC
  Filled 2021-12-26: qty 200

## 2021-12-26 MED ORDER — IOHEXOL 300 MG/ML  SOLN
INTRAMUSCULAR | Status: DC | PRN
  Administered 2021-12-26: 10 mL

## 2021-12-26 MED ORDER — HEPARIN SODIUM (PORCINE) 5000 UNIT/ML IJ SOLN
5000.0000 [IU] | Freq: Three times a day (TID) | INTRAMUSCULAR | Status: DC
  Administered 2021-12-27 – 2021-12-29 (×7): 5000 [IU] via SUBCUTANEOUS
  Filled 2021-12-26 (×7): qty 1

## 2021-12-26 MED ORDER — DOCUSATE SODIUM 100 MG PO CAPS: 100.0000 mg | ORAL_CAPSULE | Freq: Two times a day (BID) | ORAL | Status: DC | PRN

## 2021-12-26 MED ORDER — PROPOFOL 10 MG/ML IV BOLUS
INTRAVENOUS | Status: DC | PRN
  Administered 2021-12-26: 120 mg via INTRAVENOUS

## 2021-12-26 MED ORDER — LACTATED RINGERS IV BOLUS (SEPSIS)
2000.0000 mL | Freq: Once | INTRAVENOUS | Status: AC
  Administered 2021-12-26: 2000 mL via INTRAVENOUS

## 2021-12-26 MED ORDER — INSULIN ASPART 100 UNIT/ML IJ SOLN
1.0000 [IU] | INTRAMUSCULAR | Status: DC
  Administered 2021-12-27 (×2): 3 [IU] via SUBCUTANEOUS
  Administered 2021-12-27: 2 [IU] via SUBCUTANEOUS

## 2021-12-26 MED ORDER — NOREPINEPHRINE 4 MG/250ML-% IV SOLN: 0.0000 ug/min | INTRAVENOUS | Status: DC

## 2021-12-26 MED ORDER — METRONIDAZOLE 500 MG/100ML IV SOLN
500.0000 mg | Freq: Once | INTRAVENOUS | Status: AC
  Administered 2021-12-26: 500 mg via INTRAVENOUS
  Filled 2021-12-26: qty 100

## 2021-12-26 MED ORDER — POTASSIUM CHLORIDE 10 MEQ/100ML IV SOLN: 10.0000 meq | INTRAVENOUS | Status: DC

## 2021-12-26 MED ORDER — ONDANSETRON HCL 4 MG/2ML IJ SOLN: 4.0000 mg | Freq: Once | INTRAMUSCULAR | Status: DC

## 2021-12-26 MED ORDER — LACTATED RINGERS IV SOLN: INTRAVENOUS | Status: DC

## 2021-12-26 MED ORDER — VANCOMYCIN HCL IN DEXTROSE 1-5 GM/200ML-% IV SOLN
1000.0000 mg | Freq: Two times a day (BID) | INTRAVENOUS | Status: DC
  Administered 2021-12-27: 1000 mg via INTRAVENOUS
  Filled 2021-12-26: qty 200

## 2021-12-26 MED ORDER — PHENYLEPHRINE HCL (PRESSORS) 10 MG/ML IV SOLN
INTRAVENOUS | Status: DC | PRN
Start: 2021-12-26 — End: 2021-12-26
  Administered 2021-12-26: 160 ug via INTRAVENOUS
  Administered 2021-12-26 (×2): 80 ug via INTRAVENOUS

## 2021-12-26 MED ORDER — NOREPINEPHRINE 4 MG/250ML-% IV SOLN
2.0000 ug/min | INTRAVENOUS | Status: DC
  Administered 2021-12-26: 2 ug/min via INTRAVENOUS
  Administered 2021-12-27: 4 ug/min via INTRAVENOUS
  Filled 2021-12-26 (×2): qty 250

## 2021-12-26 MED ORDER — PAROXETINE HCL 20 MG PO TABS
40.0000 mg | ORAL_TABLET | Freq: Every day | ORAL | Status: DC
  Administered 2021-12-27 – 2021-12-29 (×3): 40 mg via ORAL
  Filled 2021-12-26 (×3): qty 2

## 2021-12-26 MED ORDER — SUGAMMADEX SODIUM 200 MG/2ML IV SOLN
INTRAVENOUS | Status: DC | PRN
  Administered 2021-12-26: 200 mg via INTRAVENOUS

## 2021-12-26 MED ORDER — ACETAMINOPHEN 10 MG/ML IV SOLN
INTRAVENOUS | Status: DC | PRN
  Administered 2021-12-26: 1000 mg via INTRAVENOUS

## 2021-12-26 MED ORDER — SODIUM CHLORIDE 0.9 % IV SOLN
2.0000 g | Freq: Three times a day (TID) | INTRAVENOUS | Status: DC
  Administered 2021-12-27 (×2): 2 g via INTRAVENOUS
  Filled 2021-12-26 (×2): qty 12.5

## 2021-12-26 MED ORDER — SODIUM CHLORIDE 0.9 % IV SOLN
250.0000 mL | INTRAVENOUS | Status: DC
Start: 2021-12-26 — End: 2021-12-29
  Administered 2021-12-27: 250 mL via INTRAVENOUS

## 2021-12-26 MED ORDER — ACETAMINOPHEN 325 MG PO TABS
650.0000 mg | ORAL_TABLET | ORAL | Status: DC | PRN
  Administered 2021-12-27: 650 mg via ORAL
  Filled 2021-12-26: qty 2

## 2021-12-26 MED ORDER — CALCIUM GLUCONATE-NACL 1-0.675 GM/50ML-% IV SOLN
1.0000 g | Freq: Once | INTRAVENOUS | Status: AC
Start: 2021-12-26 — End: 2021-12-27
  Administered 2021-12-26: 1000 mg via INTRAVENOUS
  Filled 2021-12-26 (×2): qty 50

## 2021-12-26 MED ORDER — FENTANYL CITRATE PF 50 MCG/ML IJ SOSY
50.0000 ug | PREFILLED_SYRINGE | Freq: Once | INTRAMUSCULAR | Status: AC
  Administered 2021-12-26: 50 ug via INTRAVENOUS
  Filled 2021-12-26: qty 1

## 2021-12-26 MED ORDER — ONDANSETRON HCL 4 MG/2ML IJ SOLN
INTRAMUSCULAR | Status: DC | PRN
  Administered 2021-12-26: 4 mg via INTRAVENOUS

## 2021-12-26 MED ORDER — ONDANSETRON HCL 4 MG/2ML IJ SOLN: 4.0000 mg | Freq: Four times a day (QID) | INTRAMUSCULAR | Status: DC | PRN

## 2021-12-26 MED ORDER — ROCURONIUM BROMIDE 100 MG/10ML IV SOLN
INTRAVENOUS | Status: DC | PRN
Start: 2021-12-26 — End: 2021-12-26
  Administered 2021-12-26: 40 mg via INTRAVENOUS

## 2021-12-26 MED ORDER — POTASSIUM CHLORIDE 10 MEQ/100ML IV SOLN
10.0000 meq | INTRAVENOUS | Status: DC
  Administered 2021-12-26 (×2): 10 meq via INTRAVENOUS
  Filled 2021-12-26 (×2): qty 100

## 2021-12-26 MED ORDER — ALBUTEROL SULFATE HFA 108 (90 BASE) MCG/ACT IN AERS
INHALATION_SPRAY | RESPIRATORY_TRACT | Status: AC
  Filled 2021-12-26: qty 6.7

## 2021-12-26 MED ORDER — ACETAMINOPHEN 10 MG/ML IV SOLN
INTRAVENOUS | Status: AC
  Filled 2021-12-26: qty 100

## 2021-12-26 MED ORDER — LACTATED RINGERS IV SOLN: INTRAVENOUS | Status: DC | PRN

## 2021-12-26 SURGICAL SUPPLY — 24 items
BAG DRN RND TRDRP ANRFLXCHMBR (UROLOGICAL SUPPLIES)
BAG URINE DRAIN 2000ML AR STRL (UROLOGICAL SUPPLIES) ×1 IMPLANT
BAG URO CATCHER STRL LF (MISCELLANEOUS) ×2 IMPLANT
CATH FOLEY 2WAY SLVR  5CC 16FR (CATHETERS)
CATH FOLEY 2WAY SLVR 5CC 16FR (CATHETERS) IMPLANT
CATH INTERMIT  6FR 70CM (CATHETERS) ×2 IMPLANT
GLOVE BIO SURGEON STRL SZ8 (GLOVE) ×2 IMPLANT
GOWN STRL REUS W/ TWL LRG LVL3 (GOWN DISPOSABLE) ×1 IMPLANT
GOWN STRL REUS W/ TWL XL LVL3 (GOWN DISPOSABLE) ×1 IMPLANT
GOWN STRL REUS W/TWL LRG LVL3 (GOWN DISPOSABLE) ×2
GOWN STRL REUS W/TWL XL LVL3 (GOWN DISPOSABLE) ×2
GUIDEWIRE ANG ZIPWIRE 038X150 (WIRE) IMPLANT
GUIDEWIRE STR DUAL SENSOR (WIRE) ×1 IMPLANT
KIT TURNOVER KIT B (KITS) ×2 IMPLANT
MANIFOLD NEPTUNE II (INSTRUMENTS) ×1 IMPLANT
NS IRRIG 1000ML POUR BTL (IV SOLUTION) IMPLANT
PACK CYSTO (CUSTOM PROCEDURE TRAY) ×2 IMPLANT
STENT URET 6FRX24 CONTOUR (STENTS) IMPLANT
STENT URET 6FRX26 CONTOUR (STENTS) ×1 IMPLANT
SYPHON OMNI JUG (MISCELLANEOUS) ×1 IMPLANT
TOWEL GREEN STERILE FF (TOWEL DISPOSABLE) ×2 IMPLANT
TRAY FOLEY W/BAG SLVR 14FR (SET/KITS/TRAYS/PACK) ×1 IMPLANT
TUBE CONNECTING 12X1/4 (SUCTIONS) IMPLANT
WATER STERILE IRR 3000ML UROMA (IV SOLUTION) ×1 IMPLANT

## 2021-12-26 NOTE — ED Triage Notes (Signed)
Pt to ER via EMS from home with c/o abdominal pain, fever and nausea.  Pt reports pain radiates into back.  Pt has hx of UTIs, but is usually asymptomatic.

## 2021-12-26 NOTE — Anesthesia Procedure Notes (Signed)
Procedure Name: Intubation Date/Time: 12/26/2021 8:28 PM Performed by: Eleah Lahaie T, CRNA Pre-anesthesia Checklist: Patient identified, Emergency Drugs available, Suction available and Patient being monitored Patient Re-evaluated:Patient Re-evaluated prior to induction Oxygen Delivery Method: Circle system utilized Preoxygenation: Pre-oxygenation with 100% oxygen Induction Type: IV induction, Rapid sequence and Cricoid Pressure applied Ventilation: Mask ventilation without difficulty Laryngoscope Size: Miller and 2 Grade View: Grade I Tube type: Oral Tube size: 7.0 mm Number of attempts: 1 Airway Equipment and Method: Stylet Placement Confirmation: ETT inserted through vocal cords under direct vision, positive ETCO2 and breath sounds checked- equal and bilateral Secured at: 21 cm Tube secured with: Tape Dental Injury: Teeth and Oropharynx as per pre-operative assessment

## 2021-12-26 NOTE — Progress Notes (Signed)
eLink Physician-Brief Progress Note Patient Name: Jennifer [name] DOB: 1965-10-06 MRN: [id number]   Date of Service  12/26/2021  HPI/Events of Note  Patient with septic shock from a urinary tract infection, against a back ground of obstructive uropathy, she was transferred to the ICU  s/p ureteral stent placement by urology.  eICU Interventions  New Patient Evaluation.        Jennifer [name] 12/26/2021, 11:25 PM

## 2021-12-26 NOTE — IPAL (Signed)
  Interdisciplinary Goals of Care Family Meeting   Date carried out:: 12/26/2021  Location of the meeting: Bedside  Member's involved: Physician, Bedside Registered Nurse, and Family Member or next of kin  Durable Power of Attorney or acting medical decision maker: patient acting as her own decision making    Discussion: We discussed goals of care for [employer] .  We discussed code status as a routine part of admission. She informed me she wants to be DNR. She assumes her family is aware of this but does not know. She understands that this does not change her current care and only applies in the event of an arrest. She confirmed understanding. I have encouraged her to share her wishes with her family. She has designated her daughter Gavin Potters to be her surrogate decision maker. Her s/o Richardson Landry was at bedside in the ED during this discussion.  Code status: Full DNR  Disposition: Continue current acute care   Time spent for the meeting: 10 min.  Julian Hy 12/26/2021, 6:50 PM

## 2021-12-26 NOTE — Progress Notes (Signed)
Elink following for sepsis protocol. 

## 2021-12-26 NOTE — Progress Notes (Signed)
Pharmacy Antibiotic Note  Jennifer [name] is a 56 y.o. female for which pharmacy has been consulted for cefepime and vancomycin dosing for sepsis.  Patient with a history of UTI, depression . Patient presenting with abdominal pain, vomiting.  SCr 1 WBC 10.3; LA 5.6; T 99.5 F; HR 105; RR24>30 COVID/Flu neg  Plan: Cefepime 2g q8hr Vancomycin 2000 mg once then 1000 mg q12hr (eAUC 511) unless change in renal function Trend WBC, Fever, Renal function, & Clinical course F/u cultures, clinical course, WBC, fever De-escalate when able Levels at steady state  Height: '5\' 8"'$  (172.7 cm) Weight: 97.5 kg (215 lb) IBW/kg (Calculated) : 63.9  Temp (24hrs), Avg:99.5 F (37.5 C), Min:99.5 F (37.5 C), Max:99.5 F (37.5 C)  No results for input(s): WBC, CREATININE, LATICACIDVEN, VANCOTROUGH, VANCOPEAK, VANCORANDOM, GENTTROUGH, GENTPEAK, GENTRANDOM, TOBRATROUGH, TOBRAPEAK, TOBRARND, AMIKACINPEAK, AMIKACINTROU, AMIKACIN in the last 168 hours.  CrCl cannot be calculated (Patient's most recent lab result is older than the maximum 21 days allowed.).    Allergies  Allergen Reactions   Nitrofurantoin Monohyd Macro Nausea And Vomiting    Body aches   Sulfa Antibiotics Nausea Only and Rash    Fever and disorientation    Antimicrobials this admission: cefepime 5/21 >>  vancomycin 5/21 >>   Microbiology results: Pending  Thank you for allowing pharmacy to be a part of this patient's care.  Lorelei Pont, PharmD, BCPS 12/26/2021 3:36 PM ED Clinical Pharmacist -  [phone]

## 2021-12-26 NOTE — Discharge Instructions (Signed)
DISCHARGE INSTRUCTIONS FOR KIDNEY STONE/URETERAL STENT   MEDICATIONS:  1. Resume all your other meds from home - except do not take any extra narcotic pain meds that you may have at home.  2. Pyridium is to help with the burning/stinging when you urinate. 3. Tramadol is for moderate/severe pain, otherwise taking upto 1000 mg every 6 hours of plainTylenol will help treat your pain.    ACTIVITY:  1. No strenuous activity x 1week  2. No driving while on narcotic pain medications  3. Drink plenty of water  4. Continue to walk at home - you can still get blood clots when you are at home, so keep active, but don't over do it.  5. May return to work/school tomorrow or when you feel ready   BATHING:  1. You can shower and we recommend daily showers  2. You have a string coming from your urethra: The stent string is attached to your ureteral stent. Do not pull on this.   SIGNS/SYMPTOMS TO CALL:  Please call us if you have a fever greater than 101.5, uncontrolled nausea/vomiting, uncontrolled pain, dizziness, unable to urinate, bloody urine, chest pain, shortness of breath, leg swelling, leg pain, redness around wound, drainage from wound, or any other concerns or questions.   You can reach [place] at [phone].   FOLLOW-UP:  1. You will be scheduled for a f/u procedure to remove the stone in your left kidney once you've recovered from the most recent hospitalization.

## 2021-12-26 NOTE — Transfer of Care (Signed)
Immediate Anesthesia Transfer of Care Note  Patient: Jennifer [name]  Procedure(s) Performed: CYSTOSCOPY WITH RETROGRADE PYELOGRAM/URETERAL STENT PLACEMENT (Left)  Patient Location: PACU  Anesthesia Type:General  Level of Consciousness: drowsy  Airway & Oxygen Therapy: Patient Spontanous Breathing and Patient connected to nasal cannula oxygen  Post-op Assessment: Report given to RN, Post -op Vital signs reviewed and stable and Patient moving all extremities  Post vital signs: Reviewed and stable  Last Vitals:  Vitals Value Taken Time  BP    Temp    Pulse 80 12/26/21 2124  Resp 19 12/26/21 2124  SpO2 96 % 12/26/21 2124  Vitals shown include unvalidated device data.  Last Pain:  Vitals:   12/26/21 1905  TempSrc: Oral  PainSc:          Complications: No notable events documented.

## 2021-12-26 NOTE — H&P (View-Only) (Signed)
I have been asked to see the patient by Dr. Noemi Chapel, for evaluation and management of left hydronephrosis and sepsis.  History of present illness: The history of depression and nephrolithiasis presented to the emergency department with 2 days of left-sided flank pain and more recent severe nausea and vomiting.  She has had emesis all day.  She has been febrile as well, noted to have a fever of 105 with EMS.  In the emergency department she was found to have a lactate of 5.8.  Her urine analysis was suspicious for infection.  She subsequently developed hypotension and was placed on Levophed.  CT scan demonstrated 6 mm left ureteral stone.  The patient has a history of nephrolithiasis.  Her last ureteroscopy was in 2013.  She has not been seen in our clinic since that time.  Review of systems: A 12 point comprehensive review of systems was obtained and is negative unless otherwise stated in the history of present illness.  Patient Active Problem List   Diagnosis Date Noted   Septic shock ([place]) 12/26/2021   Osteopenia 09/29/2020   Chronic cough 03/26/2020   Aortic atherosclerosis ([place]) 09/12/2019   Reactive airway disease 09/12/2019   Hyperlipidemia 09/12/2019   Insomnia 02/18/2016   GAD (generalized anxiety disorder) 02/18/2016   Severe episode of recurrent major depressive disorder, without psychotic features ([place]) 02/18/2016   Depression with anxiety 10/15/2014   Personal history of adenomatous colonic polyps 05/08/2012   LOW HDL 02/28/2007   OBESITY 02/28/2007   Tobacco use 02/21/2007   ECZEMA 02/21/2007   ROSACEA 02/21/2007    Current Facility-Administered Medications on File Prior to Encounter  Medication Dose Route Frequency Provider Last Rate Last Admin   0.9 %  sodium chloride infusion  500 mL Intravenous Once Gatha Mayer, MD       Current Outpatient Medications on File Prior to Encounter  Medication Sig Dispense Refill   acetaminophen (TYLENOL) 325 MG tablet Take 650  mg by mouth every 6 (six) hours as needed.     albuterol (VENTOLIN HFA) 108 (90 Base) MCG/ACT inhaler Inhale 1-2 puffs into the lungs every 6 (six) hours as needed for wheezing or shortness of breath. 6.7 g 0   ASPIRIN LOW DOSE 81 MG EC tablet TAKE ONE TABLET BY MOUTH DAILY 90 tablet 3   atorvastatin (LIPITOR) 10 MG tablet Take 1 tablet (10 mg total) by mouth daily. Office visit required for further refills. 90 tablet 0   clindamycin (CLINDAGEL) 1 % gel Apply 1 application topically as needed. For lips     ipratropium (ATROVENT) 0.06 % nasal spray Place 1 spray into both nostrils as needed.      lithium carbonate (ESKALITH) 450 MG CR tablet Take 1 tablet (450 mg total) by mouth 2 (two) times daily. 180 tablet 0   oxybutynin (DITROPAN-XL) 5 MG 24 hr tablet Take 5 mg by mouth at bedtime.     PARoxetine (PAXIL) 40 MG tablet Take 1.5 tablets (60 mg total) by mouth every morning. 135 tablet 0   sodium fluoride (FLUORISHIELD) 1.1 % GEL dental gel Place 1 application onto teeth every other day.      valACYclovir (VALTREX) 1000 MG tablet Take 1,000 mg by mouth as needed.       Past Medical History:  Diagnosis Date   Anxiety    Cancer ([place])    Depression    Diverticulosis    Fundic gland polyps of stomach, benign    Low HDL (under 40)  Personal history of adenomatous colonic polyps 05/08/2012   Rosacea    Tenosynovitis, de Quervain    Urosepsis 10/2011   after stent    Past Surgical History:  Procedure Laterality Date   CERVICAL BIOPSY  W/ LOOP ELECTRODE EXCISION  04/2003   COLONOSCOPY     CYSTOSCOPY W/ URETERAL STENT PLACEMENT  10/14/2011   Procedure: CYSTOSCOPY WITH RETROGRADE PYELOGRAM/URETERAL STENT PLACEMENT;  Surgeon: Hanley Ben, MD;  Location: [facility];  Service: Urology;  Laterality: Right;  Right Double J Stent   MULTIPLE TOOTH EXTRACTIONS     OOPHORECTOMY     left ovary   POLYPECTOMY     TUBAL LIGATION     UMBILICAL HERNIA REPAIR     UPPER GASTROINTESTINAL ENDOSCOPY  2008    VAGINAL HYSTERECTOMY     WISDOM TOOTH EXTRACTION      Social History   Tobacco Use   Smoking status: Every Day    Packs/day: 0.50    Years: 30.00    Pack years: 15.00    Types: Cigarettes   Smokeless tobacco: Never  Vaping Use   Vaping Use: Never used  Substance Use Topics   Alcohol use: No   Drug use: No    Family History  Problem Relation Age of Onset   Nephrolithiasis Mother    Skin cancer Mother        from radiation tx   Cancer Mother        ? type   Hyperlipidemia Mother    Irritable bowel syndrome Other    Fibroids Sister    Alcohol abuse Paternal Grandmother    Depression Paternal Grandmother    Suicidality Paternal Aunt    Depression Paternal Aunt    CVA Father 75   Hernia Maternal Grandmother    Other Sister        breast cyst   Skin cancer Sister    Anesthesia problems Neg Hx    Hypotension Neg Hx    Malignant hyperthermia Neg Hx    Pseudochol deficiency Neg Hx    Colon cancer Neg Hx    Colon polyps Neg Hx    Esophageal cancer Neg Hx    Stomach cancer Neg Hx    Rectal cancer Neg Hx     PE: Vitals:   12/26/21 1815 12/26/21 1830 12/26/21 1854 12/26/21 1905  BP: (!) 79/47 (!) 108/97 (!) 85/46   Pulse: 84 83 85   Resp: 17 (!) 24 (!) 29   Temp:    98.7 F (37.1 C)  TempSrc:    Oral  SpO2: 96% 96% 97%   Weight:      Height:       Patient appears to be in moderate distress patient is alert and oriented x3 Atraumatic normocephalic head No cervical or supraclavicular lymphadenopathy appreciated No increased work of breathing, no audible wheezes/rhonchi Regular sinus rhythm/rate Abdomen is soft, nontender, nondistended, severe left-sided flank and abdominal tenderness Lower extremities are symmetric without appreciable edema Grossly neurologically intact No identifiable skin lesions  Recent Labs    12/26/21 1530 12/26/21 1551  WBC 10.3  --   HGB 16.5* 16.0*  HCT 47.2* 47.0*   Recent Labs    12/26/21 1530 12/26/21 1551  NA 137  137  K 3.0* 2.9*  CL 106 104  CO2 17*  --   GLUCOSE 140* 138*  BUN 7 6  CREATININE 1.22* 1.00  CALCIUM 8.7*  --    Recent Labs    12/26/21 1530  INR  1.1   No results for input(s): LABURIN in the last 72 hours. Results for orders placed or performed during the hospital encounter of 12/26/21  Resp Panel by RT-PCR (Flu A&B, Covid) Nasopharyngeal Swab     Status: None   Collection Time: 12/26/21  3:24 PM   Specimen: Nasopharyngeal Swab; Nasopharyngeal(NP) swabs in vial transport medium  Result Value Ref Range Status   SARS Coronavirus 2 by RT PCR NEGATIVE NEGATIVE Final    Comment: (NOTE) SARS-CoV-2 target nucleic acids are NOT DETECTED.  The SARS-CoV-2 RNA is generally detectable in upper respiratory specimens during the acute phase of infection. The lowest concentration of SARS-CoV-2 viral copies this assay can detect is 138 copies/mL. A negative result does not preclude SARS-Cov-2 infection and should not be used as the sole basis for treatment or other patient management decisions. A negative result may occur with  improper specimen collection/handling, submission of specimen other than nasopharyngeal swab, presence of viral mutation(s) within the areas targeted by this assay, and inadequate number of viral copies(<138 copies/mL). A negative result must be combined with clinical observations, patient history, and epidemiological information. The expected result is Negative.  Fact Sheet for Patients:  EntrepreneurPulse.com.au  Fact Sheet for Healthcare Providers:  IncredibleEmployment.be  This test is no t yet approved or cleared by the [place] FDA and  has been authorized for detection and/or diagnosis of SARS-CoV-2 by FDA under an Emergency Use Authorization (EUA). This EUA will remain  in effect (meaning this test can be used) for the duration of the COVID-19 declaration under Section 564(b)(1) of the Act, 21 U.S.C.section  360bbb-3(b)(1), unless the authorization is terminated  or revoked sooner.       Influenza A by PCR NEGATIVE NEGATIVE Final   Influenza B by PCR NEGATIVE NEGATIVE Final    Comment: (NOTE) The Xpert Xpress SARS-CoV-2/FLU/RSV plus assay is intended as an aid in the diagnosis of influenza from Nasopharyngeal swab specimens and should not be used as a sole basis for treatment. Nasal washings and aspirates are unacceptable for Xpert Xpress SARS-CoV-2/FLU/RSV testing.  Fact Sheet for Patients: EntrepreneurPulse.com.au  Fact Sheet for Healthcare Providers: IncredibleEmployment.be  This test is not yet approved or cleared by the [place] FDA and has been authorized for detection and/or diagnosis of SARS-CoV-2 by FDA under an Emergency Use Authorization (EUA). This EUA will remain in effect (meaning this test can be used) for the duration of the COVID-19 declaration under Section 564(b)(1) of the Act, 21 U.S.C. section 360bbb-3(b)(1), unless the authorization is terminated or revoked.  Performed at [facility], [place] [address], [place], [place] [address]     Imaging: I independently reviewed the patient's CT scan which demonstrates a 6 mm stone in the left proximal ureter with some hydroureteronephrosis and delayed contrast uptake consistent with obstruction.  Imp: 56 year old female with septic shock secondary to a left obstructing ureteral stone and a UTI.  Recommendations: Plan to take the patient to the operating room emergently for a left ureteral stent placement and source control of her infection.  The remaining care will be per critical care and hospital medicine.  She will need a second procedure for stone removal.  We will work to get that scheduled as she improves.  This will be an outpatient procedure.   Ardis Hughs

## 2021-12-26 NOTE — Op Note (Signed)
Preoperative diagnosis: left obstructing ureteral stone Postoperative diagnosis: Same  Procedures performed: Cystoscopy, left retrograde pyelogram with interpretation, left ureteral stent placement  Surgeon: Dr. Ardis Hughs  Findings: A retrograde pyelogram was performed, contrast was instilled into the left collecting system and there was a small filling defect in the ureter at the expected location of the stone and proximal hydronephrosis. There were no other abnormalities.  Specimens:left renal pelvis urine culture  Indication: Jennifer [name] is a 56 y.o. patient with severe septic shock from an obstructing left ureteral stone. After reviewing the management options for treatment, he elected to proceed with the above surgical procedure(s). We have discussed the potential benefits and risks of the procedure, side effects of the proposed treatment, the likelihood of the patient achieving the goals of the procedure, and any potential problems that might occur during the procedure or recuperation. Informed consent has been obtained.  Procedure in detail: Patient was consented prior to being brought back to the operating room. He was then brought back to the operating room placed on the table in supine position. General anesthesia was then induced and endotracheal tube inserted. This then placed in dorsolithotomy position and prepped and draped in the routine sterile fashion. A timeout was held.  Using a 22.5 [place] cystoscope with a 30  lens, I gently passed the scope into the patient's urethra and into the bladder under visual guidance. A 360 cystoscopic evaluation was performed with no mucosal abnormalities, no tumors, and no foreign bodies identified. I then passed a 0. 038 Sensor wire into the left ureteral orifice and into the left renal pelvis. I then slid a 5 [place] open-ended ureteral catheter over the wire and into the renal pelvis and removed the wire.  I then aspirated 10 cc of  urine which was sent for culture. Next I injected 10 cc of contrast into the renal collecting system and performed a retrograde pyelogram. I then replaced the wire through the open-ended ureteral catheter and remove the catheter. I then slid a 26 cm x 6 French double-J ureteral stent over the wire and into the renal pelvis under fluoroscopic guidance. Once a nice curl was noted in the renal pelvis I advanced the distal end of the stent into the bladder before removing the wire completely. A final fluoroscopic image was obtained confirming the curl in the renal pelvis as well as a curl in the bladder.   Disposition: The patient returned to the PACU in stable condition.

## 2021-12-26 NOTE — Anesthesia Postprocedure Evaluation (Signed)
Anesthesia Post Note  Patient: Karalee Hauter Tal  Procedure(s) Performed: CYSTOSCOPY WITH RETROGRADE PYELOGRAM/URETERAL STENT PLACEMENT (Left)     Patient location during evaluation: PACU Anesthesia Type: General Level of consciousness: awake and alert Pain management: pain level controlled Vital Signs Assessment: post-procedure vital signs reviewed and stable Respiratory status: spontaneous breathing, nonlabored ventilation, respiratory function stable and patient connected to nasal cannula oxygen Cardiovascular status: blood pressure returned to baseline and stable Postop Assessment: no apparent nausea or vomiting Anesthetic complications: no   No notable events documented.  Last Vitals:  Vitals:   12/26/21 2145 12/26/21 2200  BP: (!) 90/51 (!) 91/52  Pulse: 78 79  Resp: (!) 23 20  Temp:  36.6 C  SpO2: 98% 98%    Last Pain:  Vitals:   12/26/21 2200  TempSrc:   PainSc: 0-No pain                 Charie Pinkus,W. EDMOND

## 2021-12-26 NOTE — Consult Note (Signed)
I have been asked to see the patient by Dr. Noemi Chapel, for evaluation and management of left hydronephrosis and sepsis.  History of present illness: The history of depression and nephrolithiasis presented to the emergency department with 2 days of left-sided flank pain and more recent severe nausea and vomiting.  She has had emesis all day.  She has been febrile as well, noted to have a fever of 105 with EMS.  In the emergency department she was found to have a lactate of 5.8.  Her urine analysis was suspicious for infection.  She subsequently developed hypotension and was placed on Levophed.  CT scan demonstrated 6 mm left ureteral stone.  The patient has a history of nephrolithiasis.  Her last ureteroscopy was in 2013.  She has not been seen in our clinic since that time.  Review of systems: A 12 point comprehensive review of systems was obtained and is negative unless otherwise stated in the history of present illness.  Patient Active Problem List   Diagnosis Date Noted   Septic shock ([place]) 12/26/2021   Osteopenia 09/29/2020   Chronic cough 03/26/2020   Aortic atherosclerosis ([place]) 09/12/2019   Reactive airway disease 09/12/2019   Hyperlipidemia 09/12/2019   Insomnia 02/18/2016   GAD (generalized anxiety disorder) 02/18/2016   Severe episode of recurrent major depressive disorder, without psychotic features ([place]) 02/18/2016   Depression with anxiety 10/15/2014   Personal history of adenomatous colonic polyps 05/08/2012   LOW HDL 02/28/2007   OBESITY 02/28/2007   Tobacco use 02/21/2007   ECZEMA 02/21/2007   ROSACEA 02/21/2007    Current Facility-Administered Medications on File Prior to Encounter  Medication Dose Route Frequency Provider Last Rate Last Admin   0.9 %  sodium chloride infusion  500 mL Intravenous Once Gatha Mayer, MD       Current Outpatient Medications on File Prior to Encounter  Medication Sig Dispense Refill   acetaminophen (TYLENOL) 325 MG tablet Take 650  mg by mouth every 6 (six) hours as needed.     albuterol (VENTOLIN HFA) 108 (90 Base) MCG/ACT inhaler Inhale 1-2 puffs into the lungs every 6 (six) hours as needed for wheezing or shortness of breath. 6.7 g 0   ASPIRIN LOW DOSE 81 MG EC tablet TAKE ONE TABLET BY MOUTH DAILY 90 tablet 3   atorvastatin (LIPITOR) 10 MG tablet Take 1 tablet (10 mg total) by mouth daily. Office visit required for further refills. 90 tablet 0   clindamycin (CLINDAGEL) 1 % gel Apply 1 application topically as needed. For lips     ipratropium (ATROVENT) 0.06 % nasal spray Place 1 spray into both nostrils as needed.      lithium carbonate (ESKALITH) 450 MG CR tablet Take 1 tablet (450 mg total) by mouth 2 (two) times daily. 180 tablet 0   oxybutynin (DITROPAN-XL) 5 MG 24 hr tablet Take 5 mg by mouth at bedtime.     PARoxetine (PAXIL) 40 MG tablet Take 1.5 tablets (60 mg total) by mouth every morning. 135 tablet 0   sodium fluoride (FLUORISHIELD) 1.1 % GEL dental gel Place 1 application onto teeth every other day.      valACYclovir (VALTREX) 1000 MG tablet Take 1,000 mg by mouth as needed.       Past Medical History:  Diagnosis Date   Anxiety    Cancer ([place])    Depression    Diverticulosis    Fundic gland polyps of stomach, benign    Low HDL (under 40)  Personal history of adenomatous colonic polyps 05/08/2012   Rosacea    Tenosynovitis, de Quervain    Urosepsis 10/2011   after stent    Past Surgical History:  Procedure Laterality Date   CERVICAL BIOPSY  W/ LOOP ELECTRODE EXCISION  04/2003   COLONOSCOPY     CYSTOSCOPY W/ URETERAL STENT PLACEMENT  10/14/2011   Procedure: CYSTOSCOPY WITH RETROGRADE PYELOGRAM/URETERAL STENT PLACEMENT;  Surgeon: Hanley Ben, MD;  Location: [facility];  Service: Urology;  Laterality: Right;  Right Double J Stent   MULTIPLE TOOTH EXTRACTIONS     OOPHORECTOMY     left ovary   POLYPECTOMY     TUBAL LIGATION     UMBILICAL HERNIA REPAIR     UPPER GASTROINTESTINAL ENDOSCOPY  2008    VAGINAL HYSTERECTOMY     WISDOM TOOTH EXTRACTION      Social History   Tobacco Use   Smoking status: Every Day    Packs/day: 0.50    Years: 30.00    Pack years: 15.00    Types: Cigarettes   Smokeless tobacco: Never  Vaping Use   Vaping Use: Never used  Substance Use Topics   Alcohol use: No   Drug use: No    Family History  Problem Relation Age of Onset   Nephrolithiasis Mother    Skin cancer Mother        from radiation tx   Cancer Mother        ? type   Hyperlipidemia Mother    Irritable bowel syndrome Other    Fibroids Sister    Alcohol abuse Paternal Grandmother    Depression Paternal Grandmother    Suicidality Paternal Aunt    Depression Paternal Aunt    CVA Father 76   Hernia Maternal Grandmother    Other Sister        breast cyst   Skin cancer Sister    Anesthesia problems Neg Hx    Hypotension Neg Hx    Malignant hyperthermia Neg Hx    Pseudochol deficiency Neg Hx    Colon cancer Neg Hx    Colon polyps Neg Hx    Esophageal cancer Neg Hx    Stomach cancer Neg Hx    Rectal cancer Neg Hx     PE: Vitals:   12/26/21 1815 12/26/21 1830 12/26/21 1854 12/26/21 1905  BP: (!) 79/47 (!) 108/97 (!) 85/46   Pulse: 84 83 85   Resp: 17 (!) 24 (!) 29   Temp:    98.7 F (37.1 C)  TempSrc:    Oral  SpO2: 96% 96% 97%   Weight:      Height:       Patient appears to be in moderate distress patient is alert and oriented x3 Atraumatic normocephalic head No cervical or supraclavicular lymphadenopathy appreciated No increased work of breathing, no audible wheezes/rhonchi Regular sinus rhythm/rate Abdomen is soft, nontender, nondistended, severe left-sided flank and abdominal tenderness Lower extremities are symmetric without appreciable edema Grossly neurologically intact No identifiable skin lesions  Recent Labs    12/26/21 1530 12/26/21 1551  WBC 10.3  --   HGB 16.5* 16.0*  HCT 47.2* 47.0*   Recent Labs    12/26/21 1530 12/26/21 1551  NA 137  137  K 3.0* 2.9*  CL 106 104  CO2 17*  --   GLUCOSE 140* 138*  BUN 7 6  CREATININE 1.22* 1.00  CALCIUM 8.7*  --    Recent Labs    12/26/21 1530  INR  1.1   No results for input(s): LABURIN in the last 72 hours. Results for orders placed or performed during the hospital encounter of 12/26/21  Resp Panel by RT-PCR (Flu A&B, Covid) Nasopharyngeal Swab     Status: None   Collection Time: 12/26/21  3:24 PM   Specimen: Nasopharyngeal Swab; Nasopharyngeal(NP) swabs in vial transport medium  Result Value Ref Range Status   SARS Coronavirus 2 by RT PCR NEGATIVE NEGATIVE Final    Comment: (NOTE) SARS-CoV-2 target nucleic acids are NOT DETECTED.  The SARS-CoV-2 RNA is generally detectable in upper respiratory specimens during the acute phase of infection. The lowest concentration of SARS-CoV-2 viral copies this assay can detect is 138 copies/mL. A negative result does not preclude SARS-Cov-2 infection and should not be used as the sole basis for treatment or other patient management decisions. A negative result may occur with  improper specimen collection/handling, submission of specimen other than nasopharyngeal swab, presence of viral mutation(s) within the areas targeted by this assay, and inadequate number of viral copies(<138 copies/mL). A negative result must be combined with clinical observations, patient history, and epidemiological information. The expected result is Negative.  Fact Sheet for Patients:  EntrepreneurPulse.com.au  Fact Sheet for Healthcare Providers:  IncredibleEmployment.be  This test is no t yet approved or cleared by the [place] FDA and  has been authorized for detection and/or diagnosis of SARS-CoV-2 by FDA under an Emergency Use Authorization (EUA). This EUA will remain  in effect (meaning this test can be used) for the duration of the COVID-19 declaration under Section 564(b)(1) of the Act, 21 U.S.C.section  360bbb-3(b)(1), unless the authorization is terminated  or revoked sooner.       Influenza A by PCR NEGATIVE NEGATIVE Final   Influenza B by PCR NEGATIVE NEGATIVE Final    Comment: (NOTE) The Xpert Xpress SARS-CoV-2/FLU/RSV plus assay is intended as an aid in the diagnosis of influenza from Nasopharyngeal swab specimens and should not be used as a sole basis for treatment. Nasal washings and aspirates are unacceptable for Xpert Xpress SARS-CoV-2/FLU/RSV testing.  Fact Sheet for Patients: EntrepreneurPulse.com.au  Fact Sheet for Healthcare Providers: IncredibleEmployment.be  This test is not yet approved or cleared by the [place] FDA and has been authorized for detection and/or diagnosis of SARS-CoV-2 by FDA under an Emergency Use Authorization (EUA). This EUA will remain in effect (meaning this test can be used) for the duration of the COVID-19 declaration under Section 564(b)(1) of the Act, 21 U.S.C. section 360bbb-3(b)(1), unless the authorization is terminated or revoked.  Performed at [facility], [place] [address], [place], [place] [address]     Imaging: I independently reviewed the patient's CT scan which demonstrates a 6 mm stone in the left proximal ureter with some hydroureteronephrosis and delayed contrast uptake consistent with obstruction.  Imp: 56 year old female with septic shock secondary to a left obstructing ureteral stone and a UTI.  Recommendations: Plan to take the patient to the operating room emergently for a left ureteral stent placement and source control of her infection.  The remaining care will be per critical care and hospital medicine.  She will need a second procedure for stone removal.  We will work to get that scheduled as she improves.  This will be an outpatient procedure.   Ardis Hughs

## 2021-12-26 NOTE — H&P (Signed)
NAME:  Jennifer [name], MRN:  [id number], DOB:  December 02, 1965, LOS: 0 ADMISSION DATE:  12/26/2021, CONSULTATION DATE:  12/26/21 REFERRING MD:  Jennifer [name], CHIEF COMPLAINT:  septic shock   History of Present Illness:  Jennifer [name] is a 56 y/o woman with a history of depression who presented with 2 days of malaise and fatigue and today developed n/v with low back pain and fevers. She has not had recent antibiotics and is not on immunosuppressive meds. She has had similar pain in the past with kidney stones. She reports a fever of almost 105 with EMS. In the ED she developed hypotension requiring norepinephrine to be restarted. She was given cefepime, vanc, flagyl after blood cultures were drawn in the ED.   Pertinent  Medical History  Depression Diverticulosis Tobacco abuse  Significant Hospital Events: Including procedures, antibiotic start and stop dates in addition to other pertinent events   5/21 admission  Interim History / Subjective:    Objective   Blood pressure (!) 83/46, pulse 85, temperature 99.5 F (37.5 C), resp. rate (!) 24, height '5\' 8"'$  (1.727 m), weight 97.5 kg, SpO2 95 %.       No intake or output data in the 24 hours ending 12/26/21 1835 Filed Weights   12/26/21 1524  Weight: 97.5 kg    Examination: General: middle aged woman lying in bed in NAD, notoxic HENT: [place]/AT, eyes anicteric Lungs: breathing comfortably on RA, no tachypnea Cardiovascular: S1S2, RRR Abdomen: obese, nondistended Extremities: no cyanosis or clubbing Neuro: awake, alert, normal speech, repositioning herself in bed Derm: no diffuse rashes  LA 5.6> 4.3 K+ 2.9 Bicarb 17 BUN 7 Cr 1.22 WBC 10.3 UA: +nitrite, >50 WBCs  Resolved Hospital Problem list     Assessment & Plan:  Septic shock due to UTI, hydrouretonephrosis suggestive of possible L pyelonephritis -con't empiric vanc & cefepime -recheck lactic acid after volume resuscitation -source control per urology-- planning  for cysto with ureteral stent placement -NE to maintain MAP >65 -tylenol, oxycodone, fentanyl for pain  Lactic acidosis -bicarb gtt -recheck BMP at midnight and in AM -serial LA  AKI, suspect pre-renal vs ATN due to sepsis -monitor -volume repletion -strict I/Os -renally dose meds  Hyperbilirubinemia Steatosis on CT -monitor, supportive care  Hypokalemia -repleted -recheck this evening  Hypocalcemia -Ca+ gluconate  Hyperglycemia -SSI PRN -goal BG <180 -check A1c  History of depression -con't lithium and paxil-- needs dose verification from pharmacy  Tobacco abuse- 1/2 ppd, ongoing -counseled on the importance of quitting, does not want a nicotine patch currently -already enrolled in lung cancer screening  Best Practice (right click and "Reselect all SmartList Selections" daily)   Diet/type: NPO DVT prophylaxis: prophylactic heparin  GI prophylaxis: N/A Lines: N/A Foley:  N/A Code Status:  DNR Last date of multidisciplinary goals of care discussion [5/21- patient updated in ED, surrogate decision maker is daughter Jennifer [name]]  Labs   CBC: Recent Labs  Lab 12/26/21 1530 12/26/21 1551  WBC 10.3  --   NEUTROABS 9.2*  --   HGB 16.5* 16.0*  HCT 47.2* 47.0*  MCV 86.9  --   PLT 243  --     Basic Metabolic Panel: Recent Labs  Lab 12/26/21 1530 12/26/21 1551  NA 137 137  K 3.0* 2.9*  CL 106 104  CO2 17*  --   GLUCOSE 140* 138*  BUN 7 6  CREATININE 1.22* 1.00  CALCIUM 8.7*  --    GFR: Estimated Creatinine Clearance: 74.8 mL/min (by C-G  formula based on SCr of 1 mg/dL). Recent Labs  Lab 12/26/21 1530 12/26/21 1724  WBC 10.3  --   LATICACIDVEN 5.6* 4.3*    Liver Function Tests: Recent Labs  Lab 12/26/21 1530  AST 39  ALT 21  ALKPHOS 110  BILITOT 1.5*  PROT 6.5  ALBUMIN 3.5   Recent Labs  Lab 12/26/21 1530  LIPASE 26   No results for input(s): AMMONIA in the last 168 hours.  ABG    Component Value Date/Time   TCO2 17 (L)  12/26/2021 1551     Coagulation Profile: Recent Labs  Lab 12/26/21 1530  INR 1.1    Cardiac Enzymes: No results for input(s): CKTOTAL, CKMB, CKMBINDEX, TROPONINI in the last 168 hours.  HbA1C: No results found for: HGBA1C  CBG: No results for input(s): GLUCAP in the last 168 hours.  Review of Systems:   Review of Systems  Constitutional:  Positive for chills, fever and malaise/fatigue.  HENT: Negative.    Respiratory: Negative.    Cardiovascular: Negative.   Gastrointestinal:  Positive for nausea and vomiting.  Genitourinary:  Positive for flank pain.  Skin: Negative.   Neurological:  Negative for focal weakness.    Past Medical History:  She,  has a past medical history of Anxiety, Cancer ([place]), Depression, Diverticulosis, Fundic gland polyps of stomach, benign, Low HDL (under 40), Personal history of adenomatous colonic polyps (05/08/2012), Rosacea, Tenosynovitis, de Quervain, and Urosepsis (10/2011).   Surgical History:   Past Surgical History:  Procedure Laterality Date   CERVICAL BIOPSY  W/ LOOP ELECTRODE EXCISION  04/2003   COLONOSCOPY     CYSTOSCOPY W/ URETERAL STENT PLACEMENT  10/14/2011   Procedure: CYSTOSCOPY WITH RETROGRADE PYELOGRAM/URETERAL STENT PLACEMENT;  Surgeon: Hanley Ben, MD;  Location: [facility];  Service: Urology;  Laterality: Right;  Right Double J Stent   MULTIPLE TOOTH EXTRACTIONS     OOPHORECTOMY     left ovary   POLYPECTOMY     TUBAL LIGATION     UMBILICAL HERNIA REPAIR     UPPER GASTROINTESTINAL ENDOSCOPY  2008   VAGINAL HYSTERECTOMY     WISDOM TOOTH EXTRACTION       Social History:   reports that she has been smoking cigarettes. She has a 15.00 pack-year smoking history. She has never used smokeless tobacco. She reports that she does not drink alcohol and does not use drugs.   Family History:  Her family history includes Alcohol abuse in her paternal grandmother; CVA (age of onset: 38) in her father; Cancer in her mother; Depression  in her paternal aunt and paternal grandmother; Fibroids in her sister; Hernia in her maternal grandmother; Hyperlipidemia in her mother; Irritable bowel syndrome in an other family member; Nephrolithiasis in her mother; Other in her sister; Skin cancer in her mother and sister; Suicidality in her paternal aunt. There is no history of Anesthesia problems, Hypotension, Malignant hyperthermia, Pseudochol deficiency, Colon cancer, Colon polyps, Esophageal cancer, Stomach cancer, or Rectal cancer.   Allergies Allergies  Allergen Reactions   Nitrofurantoin Monohyd Macro Nausea And Vomiting    Body aches   Sulfa Antibiotics Nausea Only and Rash    Fever and disorientation     Home Medications  Prior to Admission medications   Medication Sig Start Date End Date Taking? Authorizing Provider  acetaminophen (TYLENOL) 325 MG tablet Take 650 mg by mouth every 6 (six) hours as needed.    [provider]  albuterol (VENTOLIN HFA) 108 (90 Base) MCG/ACT inhaler Inhale 1-2  puffs into the lungs every 6 (six) hours as needed for wheezing or shortness of breath. 09/12/19   [name], MD  ASPIRIN LOW DOSE 81 MG EC tablet TAKE ONE TABLET BY MOUTH DAILY 12/09/20   [name], MD  atorvastatin (LIPITOR) 10 MG tablet Take 1 tablet (10 mg total) by mouth daily. Office visit required for further refills. 08/19/21   Pleas Koch, NP  clindamycin (CLINDAGEL) 1 % gel Apply 1 application topically as needed. For lips    [provider]  ipratropium (ATROVENT) 0.06 % nasal spray Place 1 spray into both nostrils as needed.     [provider]  lithium carbonate (ESKALITH) 450 MG CR tablet Take 1 tablet (450 mg total) by mouth 2 (two) times daily. 11/25/21   [name], MD  oxybutynin (DITROPAN-XL) 5 MG 24 hr tablet Take 5 mg by mouth at bedtime.    [provider]  PARoxetine (PAXIL) 40 MG tablet Take 1.5 tablets (60 mg total) by mouth every morning. 12/16/21   [name],  MD  sodium fluoride (FLUORISHIELD) 1.1 % GEL dental gel Place 1 application onto teeth every other day.     [provider]  valACYclovir (VALTREX) 1000 MG tablet Take 1,000 mg by mouth as needed.  08/24/17   [provider]     Critical care time: 45 min.    Julian Hy, DO 12/26/21 7:13 PM [facility]

## 2021-12-26 NOTE — ED Provider Notes (Signed)
[facility] EMERGENCY DEPARTMENT Provider Note   CSN: [id number] Arrival date & time: 12/26/21  1515     History  Chief Complaint  Patient presents with   Abdominal Pain    Jennifer [name] is a 56 y.o. female with medical history significant for UTI, depression here for evaluation of abdominal pain, vomiting.  Patient states symptoms began yesterday.  She has diffuse abdominal pain radiating to her back.  No chest pain, shortness of breath.  No lower extremity edema.  Father states patient came to see him today and she seemed "off."  Noted to be running a fever.  On EMS arrival patient oral temperature 103.5, tachycardic, tachypneic.  EMS called code sepsis, PTA. Was given APAP prior to arrival.  Patient states she has had some loose stools.  She does have surgical incision to her midline abdomen however denies prior history of bowel obstruction.  She denies melena or bright red blood rectum.  No history of pancreatitis, chronic EtOH use, chronic NSAID use.  States she has chronic cough at baseline.  No prior history of AAA, dissection per patient. She thinks she has a hx of kidney stones  HX of recurrent UTI  PCP- [facility]  HPI     Home Medications Prior to Admission medications   Medication Sig Start Date End Date Taking? Authorizing Provider  acetaminophen (TYLENOL) 325 MG tablet Take 650 mg by mouth every 6 (six) hours as needed.    [provider]  albuterol (VENTOLIN HFA) 108 (90 Base) MCG/ACT inhaler Inhale 1-2 puffs into the lungs every 6 (six) hours as needed for wheezing or shortness of breath. 09/12/19   [name], MD  ASPIRIN LOW DOSE 81 MG EC tablet TAKE ONE TABLET BY MOUTH DAILY 12/09/20   [name], MD  atorvastatin (LIPITOR) 10 MG tablet Take 1 tablet (10 mg total) by mouth daily. Office visit required for further refills. 08/19/21   Pleas Koch, NP  clindamycin (CLINDAGEL) 1 % gel Apply 1 application topically as needed. For  lips    [provider]  ipratropium (ATROVENT) 0.06 % nasal spray Place 1 spray into both nostrils as needed.     [provider]  lithium carbonate (ESKALITH) 450 MG CR tablet Take 1 tablet (450 mg total) by mouth 2 (two) times daily. 11/25/21   [name], MD  oxybutynin (DITROPAN-XL) 5 MG 24 hr tablet Take 5 mg by mouth at bedtime.    [provider]  PARoxetine (PAXIL) 40 MG tablet Take 1.5 tablets (60 mg total) by mouth every morning. 12/16/21   [name], MD  sodium fluoride (FLUORISHIELD) 1.1 % GEL dental gel Place 1 application onto teeth every other day.     [provider]  valACYclovir (VALTREX) 1000 MG tablet Take 1,000 mg by mouth as needed.  08/24/17   [provider]      Allergies    Nitrofurantoin monohyd macro and Sulfa antibiotics    Review of Systems   Review of Systems  Constitutional:  Positive for fatigue and fever.  HENT: Negative.    Respiratory:  Positive for cough (chronic). Negative for apnea, choking, chest tightness, shortness of breath, wheezing and stridor.   Cardiovascular: Negative.   Gastrointestinal:  Positive for abdominal pain, diarrhea, nausea and vomiting. Negative for abdominal distention, anal bleeding, blood in stool, constipation and rectal pain.  Genitourinary: Negative.   Musculoskeletal: Negative.   Skin: Negative.   Neurological:  Positive for weakness (  Generalized). Negative for dizziness, tremors, seizures, syncope, speech difficulty, light-headedness, numbness and headaches.  All other systems reviewed and are negative.  Physical Exam Updated Vital Signs BP (!) 83/46   Pulse 85   Temp 99.5 F (37.5 C)   Resp (!) 24   Ht '5\' 8"'$  (1.727 m)   Wt 97.5 kg   SpO2 95%   BMI 32.69 kg/m  Physical Exam Vitals and nursing note reviewed.  Constitutional:      General: She is not in acute distress.    Appearance: She is well-developed. She is obese. She is ill-appearing and  toxic-appearing. She is not diaphoretic.  HENT:     Head: Normocephalic and atraumatic.     Mouth/Throat:     Pharynx: Oropharynx is clear.     Comments: Dry mucous membranes Eyes:     Pupils: Pupils are equal, round, and reactive to light.  Cardiovascular:     Rate and Rhythm: Tachycardia present.     Pulses: Normal pulses.          Radial pulses are 2+ on the right side and 2+ on the left side.       Dorsalis pedis pulses are 2+ on the right side and 2+ on the left side.     Heart sounds: Normal heart sounds.  Pulmonary:     Effort: No respiratory distress.     Comments: Tachypneic, clear lung sounds. Abdominal:     General: There is no distension.     Palpations: Abdomen is soft.     Tenderness: There is generalized abdominal tenderness. There is no right CVA tenderness, left CVA tenderness, guarding or rebound. Negative signs include Murphy's sign and McBurney's sign.     Comments: Diffuse abdominal tenderness.  No focal pain.  Midline incision without erythema or warmth  Musculoskeletal:        General: Normal range of motion.     Cervical back: Normal range of motion.     Comments: Moves all 4 extremities. No midline C/T/L tenderness. No sacral wounds  Skin:    General: Skin is warm and dry.     Capillary Refill: Capillary refill takes less than 2 seconds.     Comments: No obvious rash or lesions  Neurological:     General: No focal deficit present.     Mental Status: She is alert.     Cranial Nerves: Cranial nerves 2-12 are intact.     Sensory: Sensation is intact.     Motor: Weakness present.     Comments: Alert to person, place, time however very slow to answer Moves all 4 extremities without difficulty Generalized weakness all 4 extremities CN 2-12 grossly intact  Psychiatric:        Mood and Affect: Mood normal.    ED Results / Procedures / Treatments   Labs (all labs ordered are listed, but only abnormal results are displayed) Labs Reviewed  LACTIC ACID,  PLASMA - Abnormal; Notable for the following components:      Result Value   Lactic Acid, Venous 5.6 (*)    All other components within normal limits  LACTIC ACID, PLASMA - Abnormal; Notable for the following components:   Lactic Acid, Venous 4.3 (*)    All other components within normal limits  COMPREHENSIVE METABOLIC PANEL - Abnormal; Notable for the following components:   Potassium 3.0 (*)    CO2 17 (*)    Glucose, Bld 140 (*)    Creatinine, Ser 1.22 (*)  Calcium 8.7 (*)    Total Bilirubin 1.5 (*)    GFR, Estimated 51 (*)    All other components within normal limits  CBC WITH DIFFERENTIAL/PLATELET - Abnormal; Notable for the following components:   RBC 5.43 (*)    Hemoglobin 16.5 (*)    HCT 47.2 (*)    RDW 15.9 (*)    Neutro Abs 9.2 (*)    Monocytes Absolute 0.0 (*)    Abs Immature Granulocytes 0.15 (*)    All other components within normal limits  URINALYSIS, ROUTINE W REFLEX MICROSCOPIC - Abnormal; Notable for the following components:   APPearance CLOUDY (*)    Hgb urine dipstick LARGE (*)    Nitrite POSITIVE (*)    Leukocytes,Ua LARGE (*)    WBC, UA >50 (*)    Bacteria, UA RARE (*)    All other components within normal limits  I-STAT BETA HCG BLOOD, ED ([facility], [facility], [facility] ONLY) - Abnormal; Notable for the following components:   I-stat hCG, quantitative 5.8 (*)    All other components within normal limits  I-STAT CHEM 8, ED - Abnormal; Notable for the following components:   Potassium 2.9 (*)    Glucose, Bld 138 (*)    Calcium, Ion 0.96 (*)    TCO2 17 (*)    Hemoglobin 16.0 (*)    HCT 47.0 (*)    All other components within normal limits  RESP PANEL BY RT-PCR (FLU A&B, COVID) ARPGX2  CULTURE, BLOOD (ROUTINE X 2)  CULTURE, BLOOD (ROUTINE X 2)  URINE CULTURE  PROTIME-INR  APTT  LIPASE, BLOOD  RAPID URINE DRUG SCREEN, HOSP PERFORMED  TROPONIN I (HIGH SENSITIVITY)  TROPONIN I (HIGH SENSITIVITY)    EKG EKG Interpretation  Date/Time:  Sunday Dec 26 2021 15:32:31  EDT Ventricular Rate:  105 PR Interval:  154 QRS Duration: 86 QT Interval:  417 QTC Calculation: 552 R Axis:   269 Text Interpretation: Sinus tachycardia RSR' in V1 or V2, right VCD or RVH Inferior infarct, old Consider anterior infarct Prolonged QT interval No previous ECGs available Confirmed by Wandra Arthurs [phone]) on 12/26/2021 4:56:23 PM  Radiology CT HEAD WO CONTRAST (5MM)  Result Date: 12/26/2021 CLINICAL DATA:  Delirium EXAM: CT HEAD WITHOUT CONTRAST TECHNIQUE: Contiguous axial images were obtained from the base of the skull through the vertex without intravenous contrast. RADIATION DOSE REDUCTION: This exam was performed according to the departmental dose-optimization program which includes automated exposure control, adjustment of the mA and/or kV according to patient size and/or use of iterative reconstruction technique. COMPARISON:  09/11/2004 FINDINGS: Brain: No evidence of acute infarction, hemorrhage, hydrocephalus, extra-axial collection or mass lesion/mass effect. Unchanged perivascular space inferior to the left basal ganglia. Vascular: No hyperdense vessel or unexpected calcification. Skull: Normal. Negative for fracture or focal lesion. Sinuses/Orbits: Prominent mucosal thickening within the left maxillary sinus with near complete opacification. Other: None. IMPRESSION: 1. No acute intracranial abnormality. 2. Left maxillary sinus disease. Electronically Signed   By: [name] Poke D.O.   On: 12/26/2021 17:41   CT ABDOMEN PELVIS W CONTRAST  Result Date: 12/26/2021 CLINICAL DATA:  Delirium, sepsis. EXAM: CT ABDOMEN AND PELVIS WITH CONTRAST TECHNIQUE: Multidetector CT imaging of the abdomen and pelvis was performed using the standard protocol following bolus administration of intravenous contrast. RADIATION DOSE REDUCTION: This exam was performed according to the departmental dose-optimization program which includes automated exposure control, adjustment of the mA and/or kV  according to patient size and/or use of iterative reconstruction technique. CONTRAST:  172m OMNIPAQUE IOHEXOL 300 MG/ML  SOLN COMPARISON:  CT abdomen pelvis dated 07/19/2013. FINDINGS: Lower chest: Mild bilateral dependent atelectasis. Hepatobiliary: No focal liver abnormality is seen. The liver is hypoattenuating relative to the spleen which may reflect hepatic steatosis. No gallstones, gallbladder wall thickening, or biliary dilatation. Pancreas: Unremarkable. No pancreatic ductal dilatation or surrounding inflammatory changes. Spleen: Normal in size without focal abnormality. Adrenals/Urinary Tract: Adrenal glands are unremarkable. There is an obstructing 6 mm calculus in the proximal third of the left ureter resulting in mild left hydroureteronephrosis. Multiple bilateral nonobstructing renal calculi measure up to 2 mm in size. There is no focal lesion of the right kidney and there is no right hydronephrosis. Bladder is unremarkable. Stomach/Bowel: Stomach is within normal limits. Appendix appears normal. No evidence of bowel wall thickening, distention, or inflammatory changes. Vascular/Lymphatic: Aortic atherosclerosis. No enlarged abdominal or pelvic lymph nodes. Reproductive: Status post hysterectomy. No adnexal masses. Other: No abdominal wall hernia or abnormality. No abdominopelvic ascites. Musculoskeletal: Degenerative changes are seen in the spine. IMPRESSION: 1. Obstructing 6 mm calculus in the proximal third of the left ureter resulting in mild left hydroureteronephrosis. 2. Findings suggestive of hepatic steatosis. Aortic Atherosclerosis (ICD10-I70.0). Electronically Signed   By: TZerita BoersM.D.   On: 12/26/2021 17:51   DG Chest Port 1 View  Result Date: 12/26/2021 CLINICAL DATA:  Questionable sepsis. EXAM: PORTABLE CHEST 1 VIEW COMPARISON:  August 10, 2017 FINDINGS: The heart size and mediastinal contours are within normal limits. Both lungs are clear. The visualized skeletal structures are  unremarkable. IMPRESSION: No active disease. Electronically Signed   By: DDorise BullionIII M.D.   On: 12/26/2021 16:38    Procedures .Critical Care Performed by: HNettie Elm PA-C Authorized by: HNettie Elm PA-C   Critical care provider statement:    Critical care time (minutes):  75   Critical care was necessary to treat or prevent imminent or life-threatening deterioration of the following conditions:  Circulatory failure, dehydration, sepsis and shock   Critical care was time spent personally by me on the following activities:  Development of treatment plan with patient or surrogate, discussions with consultants, evaluation of patient's response to treatment, examination of patient, ordering and review of laboratory studies, ordering and review of radiographic studies, ordering and performing treatments and interventions, pulse oximetry, re-evaluation of patient's condition and review of old charts    Medications Ordered in ED Medications  lactated ringers infusion ( Intravenous New Bag/Given 12/26/21 1552)  vancomycin (VANCOREADY) IVPB 2000 mg/400 mL (2,000 mg Intravenous New Bag/Given 12/26/21 1646)  potassium chloride 10 mEq in 100 mL IVPB (10 mEq Intravenous New Bag/Given 12/26/21 1740)  lactated ringers bolus 2,000 mL (2,000 mLs Intravenous New Bag/Given 12/26/21 1733)  ceFEPIme (MAXIPIME) 2 g in sodium chloride 0.9 % 100 mL IVPB (has no administration in time range)  vancomycin (VANCOCIN) IVPB 1000 mg/200 mL premix (has no administration in time range)  0.9 %  sodium chloride infusion (has no administration in time range)  norepinephrine (LEVOPHED) '4mg'$  in 2527m(0.016 mg/mL) premix infusion (has no administration in time range)  ceFEPIme (MAXIPIME) 2 g in sodium chloride 0.9 % 100 mL IVPB (0 g Intravenous Stopped 12/26/21 1613)  metroNIDAZOLE (FLAGYL) IVPB 500 mg (0 mg Intravenous Stopped 12/26/21 1654)  lactated ringers bolus 1,000 mL (0 mLs Intravenous Stopped 12/26/21  1647)  fentaNYL (SUBLIMAZE) injection 50 mcg (50 mcg Intravenous Given 12/26/21 1700)  iohexol (OMNIPAQUE) 300 MG/ML solution 100 mL (100 mLs Intravenous Contrast Given 12/26/21  [email or web address])   ED Course/ Medical Decision Making/ A&P    56 year old history of recurrent UTI, depression here for evaluation under good sepsis.  Noted 1 day of nausea, vomiting, diarrhea.  Noted to be febrile up to 103.5 with EMS as well as tachycardic and tachypneic.  Received Tylenol prior to arrival as well as 500 IV fluids.  Patient states she has had diffuse abdominal pain radiating to her back.  States she typically does not have any dysuria or hematuria with her recurrent UTI.  She has no focal pain.  No melena blood per rectum.  Father also present in the room states patient seemed "off earlier today however he did not think she was altered.  She is ANO x4 here in the emergency department however is very slow to answer questions.  She has a nonfocal neuro exam without deficits.  She has no evidence of traumatic injuries on exam.  No rashes to suggest cellulitis or abscess as cause of her fever.  Does have a chronic cough.  Will get chest x-ray to rule out pneumonia.  She has no chest pain, shortness of breath, no history of AAA or dissections.  She is neurovascularly intact.  Code sepsis called on arrival, given broad-spectrum antibiotics, IV fluid.  No hypotension, pending lactic acid we will start with 1 L IV fluid.  Of 100 cc given by EMS prior to arrival  Labs and imaging personally viewed and interpreted:  CBC without leukocytosis, hemoglobin 51.8 Metabolic panel hypokalemia at 3.0, given emesis will give IV replacement, glucose 140, CO2 17 suspect due to vomiting, creatinine 1.22, T. bili 1.5 I-STAT hCG 5.8, patient total hysterectomy Lactic acid 5.6 given IV fluids Trop 7 Lipase 26 Chest x-ray without pneumonia, cardiomegaly, pulm edema, pneumothorax EKG without ischemic changes, prolonged QT interval. Will hold on  Zofran. No active emesis COVID/ Flu neg Urine positive for UTI, per chart review I do not see any prior urine cultures that have sensitivity reports  Patient reassessed.  Still tachycardic.  Lactic acid 5.6.  Given additional 2 L IV fluid to complete her 30/cc/kg bolus.  No current hypotension, maps greater than 65.  Patient reassessed. Pain controlled.  Patient reassessed.  Unfortunately blood pressures have not been verified in the computer however looking at her BP monitor it looks like patient has had blood pressure in the 80s over the last hour.  She is mentating appropriately.  Will start on Levophed.  She has received a total of 2500 cc fluid bolus from EMS and here.  She has her third liter hanging.  Will start on Levophed due to persistent hypotension.  CT scan shows 6 mm left ureteral stone with hydronephrosis CT head without acute abnormality  1810: CONSULT with Urology Dr. Louis Meckel who rec for stent placement.  Patient may stay here at [facility].  1820: CONSULT with PCCM Shirlee Limerick NP who states they will evaluate patient for admission.  Patient to be admitted for sepsis with septic shock for infected ureteral stone.  The patient appears reasonably stabilized for admission considering the current resources, flow, and capabilities available in the ED at this time, and I doubt any other [facility] requiring further screening and/or treatment in the ED prior to admission.                           Medical Decision Making Amount and/or Complexity of Data Reviewed Independent Historian: parent and EMS External Data Reviewed: labs, radiology, ECG  and notes. Labs: ordered. Decision-making details documented in ED Course. Radiology: ordered and independent interpretation performed. Decision-making details documented in ED Course. ECG/medicine tests: ordered and independent interpretation performed. Decision-making details documented in ED Course.  Risk OTC drugs. Prescription drug  management. Decision regarding hospitalization. Diagnosis or treatment significantly limited by social determinants of health.         Final Clinical Impression(s) / ED Diagnoses Final diagnoses:  Lactic acid acidosis  Hypokalemia  Altered mental status, unspecified altered mental status type  Acute cystitis with hematuria  Ureteral stone with hydronephrosis  Septic shock [facility])    Rx / DC Orders ED Discharge Orders     None         Kimi Kroft A, PA-C 12/26/21 1823    Drenda Freeze, MD 12/29/21 1504

## 2021-12-26 NOTE — Anesthesia Preprocedure Evaluation (Addendum)
Anesthesia Evaluation  Patient identified by MRN, date of birth, ID band Patient awake    Reviewed: Allergy & Precautions, H&P , NPO status , Patient's Chart, lab work & pertinent test results  Airway Mallampati: III  TM Distance: >3 FB Neck ROM: Full    Dental no notable dental hx. (+) Teeth Intact, Dental Advisory Given   Pulmonary Current Smoker,    Pulmonary exam normal breath sounds clear to auscultation       Cardiovascular negative cardio ROS   Rhythm:Regular Rate:Normal     Neuro/Psych Anxiety Depression negative neurological ROS     GI/Hepatic negative GI ROS, Neg liver ROS,   Endo/Other  negative endocrine ROS  Renal/GU negative Renal ROS  negative genitourinary   Musculoskeletal   Abdominal   Peds  Hematology negative hematology ROS (+)   Anesthesia Other Findings   Reproductive/Obstetrics negative OB ROS                            Anesthesia Physical Anesthesia Plan  ASA: 2  Anesthesia Plan: General   Post-op Pain Management: Ofirmev IV (intra-op)*   Induction: Intravenous  PONV Risk Score and Plan: 3 and Ondansetron, Dexamethasone and Midazolam  Airway Management Planned: Oral ETT  Additional Equipment:   Intra-op Plan:   Post-operative Plan: Extubation in OR  Informed Consent: I have reviewed the patients History and Physical, chart, labs and discussed the procedure including the risks, benefits and alternatives for the proposed anesthesia with the patient or authorized representative who has indicated his/her understanding and acceptance.   Patient has DNR.  Discussed DNR with patient and Continue DNR.   Dental advisory given  Plan Discussed with: CRNA  Anesthesia Plan Comments:        Anesthesia Quick Evaluation

## 2021-12-27 ENCOUNTER — Encounter (HOSPITAL_COMMUNITY): Payer: Self-pay | Admitting: Urology

## 2021-12-27 DIAGNOSIS — A419 Sepsis, unspecified organism: Secondary | ICD-10-CM | POA: Diagnosis not present

## 2021-12-27 DIAGNOSIS — R6521 Severe sepsis with septic shock: Secondary | ICD-10-CM | POA: Diagnosis not present

## 2021-12-27 LAB — CBC
HCT: 39.3 % (ref 36.0–46.0)
HCT: 37.9 % (ref 36.0–46.0)
Hemoglobin: 13.6 g/dL (ref 12.0–15.0)
Hemoglobin: 13.3 g/dL (ref 12.0–15.0)
MCH: 30.1 pg (ref 26.0–34.0)
MCH: 30.4 pg (ref 26.0–34.0)
MCHC: 34.6 g/dL (ref 30.0–36.0)
MCHC: 35.1 g/dL (ref 30.0–36.0)
MCV: 86.9 fL (ref 80.0–100.0)
MCV: 86.5 fL (ref 80.0–100.0)
Platelets: 234 10*3/uL (ref 150–400)
Platelets: 237 10*3/uL (ref 150–400)
RBC: 4.52 MIL/uL (ref 3.87–5.11)
RBC: 4.38 MIL/uL (ref 3.87–5.11)
RDW: 16.4 % — ABNORMAL HIGH (ref 11.5–15.5)
RDW: 16.3 % — ABNORMAL HIGH (ref 11.5–15.5)
WBC: 49.8 10*3/uL — ABNORMAL HIGH (ref 4.0–10.5)
WBC: 55.4 10*3/uL (ref 4.0–10.5)
nRBC: 0 % (ref 0.0–0.2)
nRBC: 0 % (ref 0.0–0.2)

## 2021-12-27 LAB — BLOOD CULTURE ID PANEL (REFLEXED) - BCID2

## 2021-12-27 LAB — LACTIC ACID, PLASMA
Lactic Acid, Venous: 4.6 mmol/L (ref 0.5–1.9)
Lactic Acid, Venous: 4.6 mmol/L (ref 0.5–1.9)
Lactic Acid, Venous: 4.2 mmol/L (ref 0.5–1.9)

## 2021-12-27 LAB — BASIC METABOLIC PANEL
Anion gap: 9 (ref 5–15)
Anion gap: 9 (ref 5–15)
BUN: 9 mg/dL (ref 6–20)
BUN: 9 mg/dL (ref 6–20)
CO2: 19 mmol/L — ABNORMAL LOW (ref 22–32)
CO2: 21 mmol/L — ABNORMAL LOW (ref 22–32)
Calcium: 8.7 mg/dL — ABNORMAL LOW (ref 8.9–10.3)
Calcium: 8.7 mg/dL — ABNORMAL LOW (ref 8.9–10.3)
Chloride: 108 mmol/L (ref 98–111)
Chloride: 110 mmol/L (ref 98–111)
Creatinine, Ser: 1.18 mg/dL — ABNORMAL HIGH (ref 0.44–1.00)
Creatinine, Ser: 1.04 mg/dL — ABNORMAL HIGH (ref 0.44–1.00)
GFR, Estimated: 54 mL/min — ABNORMAL LOW (ref >60)
GFR, Estimated: >60 mL/min (ref >60)
Glucose, Bld: 185 mg/dL — ABNORMAL HIGH (ref 70–99)
Glucose, Bld: 202 mg/dL — ABNORMAL HIGH (ref 70–99)
Potassium: 3.4 mmol/L — ABNORMAL LOW (ref 3.5–5.1)
Potassium: 3.3 mmol/L — ABNORMAL LOW (ref 3.5–5.1)
Sodium: 136 mmol/L (ref 135–145)
Sodium: 140 mmol/L (ref 135–145)

## 2021-12-27 LAB — RAPID URINE DRUG SCREEN, HOSP PERFORMED
Amphetamines: NOT DETECTED
Barbiturates: NOT DETECTED
Benzodiazepines: NOT DETECTED
Cocaine: NOT DETECTED
Opiates: NOT DETECTED
Tetrahydrocannabinol: NOT DETECTED

## 2021-12-27 LAB — HEMOGLOBIN A1C
Hgb A1c MFr Bld: 5.2 % (ref 4.8–5.6)
Mean Plasma Glucose: 102.54 mg/dL

## 2021-12-27 LAB — GLUCOSE, CAPILLARY
Glucose-Capillary: 223 mg/dL — ABNORMAL HIGH (ref 70–99)
Glucose-Capillary: 201 mg/dL — ABNORMAL HIGH (ref 70–99)
Glucose-Capillary: 164 mg/dL — ABNORMAL HIGH (ref 70–99)
Glucose-Capillary: 167 mg/dL — ABNORMAL HIGH (ref 70–99)
Glucose-Capillary: 149 mg/dL — ABNORMAL HIGH (ref 70–99)
Glucose-Capillary: 128 mg/dL — ABNORMAL HIGH (ref 70–99)

## 2021-12-27 LAB — PHOSPHORUS: Phosphorus: 2.2 mg/dL — ABNORMAL LOW (ref 2.5–4.6)

## 2021-12-27 LAB — MAGNESIUM: Magnesium: 1.6 mg/dL — ABNORMAL LOW (ref 1.7–2.4)

## 2021-12-27 LAB — HIV ANTIBODY (ROUTINE TESTING W REFLEX): HIV Screen 4th Generation wRfx: NONREACTIVE

## 2021-12-27 MED ORDER — INSULIN ASPART 100 UNIT/ML IJ SOLN: 0.0000 [IU] | Freq: Every day | INTRAMUSCULAR | Status: DC

## 2021-12-27 MED ORDER — CHLORHEXIDINE GLUCONATE CLOTH 2 % EX PADS
6.0000 | MEDICATED_PAD | Freq: Every day | CUTANEOUS | Status: DC
  Administered 2021-12-27 – 2021-12-28 (×2): 6 via TOPICAL

## 2021-12-27 MED ORDER — MAGNESIUM SULFATE 4 GM/100ML IV SOLN
4.0000 g | Freq: Once | INTRAVENOUS | Status: AC
  Administered 2021-12-27: 4 g via INTRAVENOUS
  Filled 2021-12-27: qty 100

## 2021-12-27 MED ORDER — LACTATED RINGERS IV BOLUS
1000.0000 mL | Freq: Once | INTRAVENOUS | Status: AC
  Administered 2021-12-27: 1000 mL via INTRAVENOUS

## 2021-12-27 MED ORDER — SODIUM CHLORIDE 0.9 % IV SOLN
2.0000 g | INTRAVENOUS | Status: DC
  Administered 2021-12-27 – 2021-12-28 (×2): 2 g via INTRAVENOUS
  Filled 2021-12-27 (×2): qty 20

## 2021-12-27 MED ORDER — POTASSIUM PHOSPHATES 15 MMOLE/5ML IV SOLN
15.0000 mmol | Freq: Once | INTRAVENOUS | Status: AC
  Administered 2021-12-27: 15 mmol via INTRAVENOUS
  Filled 2021-12-27: qty 5

## 2021-12-27 MED ORDER — GUAIFENESIN-DM 100-10 MG/5ML PO SYRP
10.0000 mL | ORAL_SOLUTION | Freq: Four times a day (QID) | ORAL | Status: DC | PRN
  Administered 2021-12-27 – 2021-12-29 (×5): 10 mL via ORAL
  Filled 2021-12-27 (×7): qty 10

## 2021-12-27 MED ORDER — INSULIN ASPART 100 UNIT/ML IJ SOLN
0.0000 [IU] | Freq: Three times a day (TID) | INTRAMUSCULAR | Status: DC
  Administered 2021-12-27 (×2): 3 [IU] via SUBCUTANEOUS

## 2021-12-27 MED ORDER — LACTATED RINGERS IV SOLN: INTRAVENOUS | Status: DC

## 2021-12-27 NOTE — Progress Notes (Signed)
eLink Physician-Brief Progress Note Patient Name: Jennifer [name] DOB: 1965-12-21 MRN: [id number]   Date of Service  12/27/2021  HPI/Events of Note  Patient has an order for Sodium bicarbonate gtt but her serum bicarb is 19, serum lactic acid is 4.6 which has increased from previous result, she is currently on LR at 75 ml / hour.  eICU Interventions  Bicarb gtt order discontinued, LR ordered at 150 ml / hour x 20 hours.        Joseguadalupe Stan U Rye Dorado 12/27/2021, 2:10 AM

## 2021-12-27 NOTE — Progress Notes (Addendum)
NAME:  Jennifer [name], MRN:  [id number], DOB:  09-30-1965, LOS: 1 ADMISSION DATE:  12/26/2021, CONSULTATION DATE:  12/26/21 REFERRING MD:  Orlan Leavens, CHIEF COMPLAINT:  septic shock   History of Present Illness:  Jennifer [name] is a 56 y/o woman with a history of depression who presented with 2 days of malaise and fatigue and today developed n/v with low back pain and fevers. She has not had recent antibiotics and is not on immunosuppressive meds. She has had similar pain in the past with kidney stones. She reports a fever of almost 105 with EMS. In the ED she developed hypotension requiring norepinephrine to be restarted. She was given cefepime, vanc, flagyl after blood cultures were drawn in the ED.   Pertinent  Medical History  Depression Diverticulosis Tobacco abuse  Significant Hospital Events: Including procedures, antibiotic start and stop dates in addition to other pertinent events   5/21 admission 5/21 underwent ureteral stent placement 5/22 narrowed abx to CFTX for E coli bacteremia  Interim History / Subjective:   Evaluated Jennifer [name] at bedside. States that she feels so much better now. She is not currently having any pain. She is thirsty.  Asking how long she will need to be hospitalized so that she can arrange for someone to care for her pets. Discussed that it may take at least a several days.  States that she did not feel any UTI symptoms (burning, frequency, urgency, etc) prior to admission.   Objective   Blood pressure (!) 114/53, pulse 72, temperature 98.1 F (36.7 C), temperature source Oral, resp. rate (!) 29, height '5\' 8"'$  (1.727 m), weight 97.5 kg, SpO2 95 %.        Intake/Output Summary (Last 24 hours) at 12/27/2021 0942 Last data filed at 12/27/2021 0800 Gross per 24 hour  Intake 4021.26 ml  Output 3575 ml  Net 446.26 ml   Filed Weights   12/26/21 1524  Weight: 97.5 kg    Examination: General: pleasant middle aged female, lying in bed,  NAD. HENT: anicteric sclera, dry oral mucosa CV: normal rate and regular rhythm, no m/r/g. Pulm: CTABL, no adventitious sounds noted. Normal work of breathing on RA. Abdomen: soft, nondistended. Nontender at rest, some tenderness with palpation. +BS. Extremities: no peripheral edema noted. Neuro: AAOx3, no focal deficits noted.   Resolved Hospital Problem list   AKI  Assessment & Plan:  L Obstructive Ureteral Stone s/p Stent Placement 5/21 Lactic acidosis E coli bacteremia 2/2 UTI Septic Shock She is now POD #1 following L ureteral stent placement per urology. Appreciate assistance. Will need second procedure to remove ureteral stone as an outpatient. Overall, reassuring that she is feeling better. Requiring minimal pressor support, will continue to wean as tolerated to maintain MAP >65. She is feeling up for PO intake, so will encourage fluids. Blood cultures grew E coli. She does have worsening leukocytosis, but this is likely reactive 2/2 stent procedure. -narrow abx from vanc/cefepime to CFTX  -discontinue maintenance fluids, will give 1L LR bolus and encourage PO intake -levophed to maintain MAP >65, wean as tolerated to off -recheck lactate this afternoon -pain control with tylenol, oxycodone, fentanyl  AKI - resolved Several potential etiologies, including prerenal from decreased PO intake versus ATN 2/2 septic shock versus post-obstructive 2/2 hydronephrosis from ureteral stone. She is now s/p ureteral stent placement and has been receiving IVF for rehydration. Kidney function back to baseline. -1L LR bolus today for volume repletion -stopped maintenance fluids -encourage PO intake -strict  I/Os, monitor UOP  Hypokalemia Hypomagnesemia Hypophosphatemia Hypocalcemia -monitor electrolytes, replete as needed  Hyperglycemia CBGs slightly above goal. Received dexamethasone prior to ureteral stent procedure yesterday. -changed to ACHS moderate SSI -goal CBG 140-180 -check  A1c today  Hyperbilirubinemia Hepatic Steatosis on CT -monitor, supportive care  Depression -continue lithium and paxil  Tobacco abuse - 1/2 ppd, ongoing -counseled on the importance of quitting, does not want a nicotine patch currently -already enrolled in lung cancer screening  Best Practice (right click and "Reselect all SmartList Selections" daily)   Diet/type: Clear liquids DVT prophylaxis: prophylactic heparin  GI prophylaxis: N/A Lines: N/A Foley:  In place Code Status:  DNR Last date of multidisciplinary goals of care discussion [5/22 - updated patient, mother, and significant other at bedside. Surrogate decision maker is daughter, Jennifer [name]]  Critical care time: 35 min.    Virl Axe, MD IMTS PGY-2 12/27/21 9:42 AM

## 2021-12-27 NOTE — Progress Notes (Signed)
[facility] ADULT ICU REPLACEMENT PROTOCOL   The patient does apply for the [facility] Adult ICU Electrolyte Replacment Protocol based on the criteria listed below:   1.Exclusion criteria: TCTS patients, ECMO patients, and Dialysis patients 2. Is GFR >/= 30 ml/min? Yes.    Patient's GFR today is 54 3. Is SCr </= 2? Yes.   Patient's SCr is 1.18 mg/dL 4. Did SCr increase >/= 0.5 in 24 hours? No. 5.Pt's weight >40kg  Yes.   6. Abnormal electrolyte(s): Phos 2.2, K+ 3.4, Mag 1.6  7. Electrolytes replaced per protocol 8.  Call MD STAT for K+ </= 2.5, Phos </= 1, or Mag </= 1 Physician:  Dr.Ogan  Carlisle Beers 12/27/2021 2:05 AM

## 2021-12-27 NOTE — Progress Notes (Signed)
eLink Physician-Brief Progress Note Patient Name: MARVIS BAKKEN DOB: Apr 27, 1966 MRN: [id number]   Date of Service  12/27/2021  HPI/Events of Note  Bedside RN reporting HR in 50-60s On camera check, patient AAOx4. On levophed 2 Asymptomatic, no chest paint, sob, dizziness EKG and beside tele with intermittent sinus brady to 50s Tele reviewed and similar rates in 50s-70s noted earlier in the day  eICU Interventions  Continue to monitor on tele     Intervention Category Intermediate Interventions: Arrhythmia - evaluation and management  Sydnie Sigmund Rodman Pickle 12/27/2021, 10:34 PM

## 2021-12-27 NOTE — Progress Notes (Signed)
PHARMACY - PHYSICIAN COMMUNICATION CRITICAL VALUE ALERT - BLOOD CULTURE IDENTIFICATION (BCID)  Jennifer [name] is an 56 y.o. female who presented to [facility] on 12/26/2021 with a chief complaint of fevers, back pain, fatigue.   Assessment:  1/4 blood cultures from 5/21 growing E. Coli (no resistance markers detected) (include suspected source if known  Name of physician (or Provider) Contacted: Dr. Lucile Shutters  Current antibiotics: vancomycin, cefepime  Changes to prescribed antibiotics recommended:  Patient is on recommended antibiotics - No changes needed  Results for orders placed or performed during the hospital encounter of 12/26/21  Blood Culture ID Panel (Reflexed) (Collected: 12/26/2021  3:29 PM)  Result Value Ref Range   Enterococcus faecalis NOT DETECTED NOT DETECTED   Enterococcus Faecium NOT DETECTED NOT DETECTED   Listeria monocytogenes NOT DETECTED NOT DETECTED   Staphylococcus species NOT DETECTED NOT DETECTED   Staphylococcus aureus (BCID) NOT DETECTED NOT DETECTED   Staphylococcus epidermidis NOT DETECTED NOT DETECTED   Staphylococcus lugdunensis NOT DETECTED NOT DETECTED   Streptococcus species NOT DETECTED NOT DETECTED   Streptococcus agalactiae NOT DETECTED NOT DETECTED   Streptococcus pneumoniae NOT DETECTED NOT DETECTED   Streptococcus pyogenes NOT DETECTED NOT DETECTED   A.calcoaceticus-baumannii NOT DETECTED NOT DETECTED   Bacteroides fragilis NOT DETECTED NOT DETECTED   Enterobacterales DETECTED (A) NOT DETECTED   Enterobacter cloacae complex NOT DETECTED NOT DETECTED   Escherichia coli DETECTED (A) NOT DETECTED   Klebsiella aerogenes NOT DETECTED NOT DETECTED   Klebsiella oxytoca NOT DETECTED NOT DETECTED   Klebsiella pneumoniae NOT DETECTED NOT DETECTED   Proteus species NOT DETECTED NOT DETECTED   Salmonella species NOT DETECTED NOT DETECTED   Serratia marcescens NOT DETECTED NOT DETECTED   Haemophilus influenzae NOT DETECTED NOT DETECTED   Neisseria  meningitidis NOT DETECTED NOT DETECTED   Pseudomonas aeruginosa NOT DETECTED NOT DETECTED   Stenotrophomonas maltophilia NOT DETECTED NOT DETECTED   Candida albicans NOT DETECTED NOT DETECTED   Candida auris NOT DETECTED NOT DETECTED   Candida glabrata NOT DETECTED NOT DETECTED   Candida krusei NOT DETECTED NOT DETECTED   Candida parapsilosis NOT DETECTED NOT DETECTED   Candida tropicalis NOT DETECTED NOT DETECTED   Cryptococcus neoformans/gattii NOT DETECTED NOT DETECTED   CTX-M ESBL NOT DETECTED NOT DETECTED   Carbapenem resistance IMP NOT DETECTED NOT DETECTED   Carbapenem resistance KPC NOT DETECTED NOT DETECTED   Carbapenem resistance NDM NOT DETECTED NOT DETECTED   Carbapenem resist OXA 48 LIKE NOT DETECTED NOT DETECTED   Carbapenem resistance VIM NOT DETECTED NOT DETECTED     Thank you for allowing pharmacy to be a part of this patient's care.  Ardyth Harps, PharmD Clinical Pharmacist

## 2021-12-27 NOTE — Progress Notes (Signed)
This RN spoke with urology about removing foley catheter. Dr. Jeffie Pollock (on call urologist) states there is no issue with removing the foley. Will update CCM and remove foley if able.

## 2021-12-28 DIAGNOSIS — R6521 Severe sepsis with septic shock: Secondary | ICD-10-CM | POA: Diagnosis not present

## 2021-12-28 DIAGNOSIS — A419 Sepsis, unspecified organism: Secondary | ICD-10-CM | POA: Diagnosis not present

## 2021-12-28 LAB — CBC WITH DIFFERENTIAL/PLATELET
Abs Immature Granulocytes: 1.14 10*3/uL — ABNORMAL HIGH (ref 0.00–0.07)
Basophils Absolute: 0.1 10*3/uL (ref 0.0–0.1)
Basophils Relative: 0 %
Eosinophils Absolute: 0 10*3/uL (ref 0.0–0.5)
Eosinophils Relative: 0 %
HCT: 37.3 % (ref 36.0–46.0)
Hemoglobin: 13.1 g/dL (ref 12.0–15.0)
Immature Granulocytes: 3 %
Lymphocytes Relative: 8 %
Lymphs Abs: 3.3 10*3/uL (ref 0.7–4.0)
MCH: 30.8 pg (ref 26.0–34.0)
MCHC: 35.1 g/dL (ref 30.0–36.0)
MCV: 87.6 fL (ref 80.0–100.0)
Monocytes Absolute: 2 10*3/uL — ABNORMAL HIGH (ref 0.1–1.0)
Monocytes Relative: 5 %
Neutro Abs: 36.5 10*3/uL — ABNORMAL HIGH (ref 1.7–7.7)
Neutrophils Relative %: 84 %
Platelets: 211 10*3/uL (ref 150–400)
RBC: 4.26 MIL/uL (ref 3.87–5.11)
RDW: 17.1 % — ABNORMAL HIGH (ref 11.5–15.5)
WBC: 43 10*3/uL — ABNORMAL HIGH (ref 4.0–10.5)
nRBC: 0 % (ref 0.0–0.2)

## 2021-12-28 LAB — COMPREHENSIVE METABOLIC PANEL
ALT: 17 U/L (ref 0–44)
AST: 15 U/L (ref 15–41)
Albumin: 2.7 g/dL — ABNORMAL LOW (ref 3.5–5.0)
Alkaline Phosphatase: 61 U/L (ref 38–126)
Anion gap: 8 (ref 5–15)
BUN: 11 mg/dL (ref 6–20)
CO2: 20 mmol/L — ABNORMAL LOW (ref 22–32)
Calcium: 8.3 mg/dL — ABNORMAL LOW (ref 8.9–10.3)
Chloride: 112 mmol/L — ABNORMAL HIGH (ref 98–111)
Creatinine, Ser: 0.84 mg/dL (ref 0.44–1.00)
GFR, Estimated: >60 mL/min (ref >60)
Glucose, Bld: 136 mg/dL — ABNORMAL HIGH (ref 70–99)
Potassium: 3.5 mmol/L (ref 3.5–5.1)
Sodium: 140 mmol/L (ref 135–145)
Total Bilirubin: 0.4 mg/dL (ref 0.3–1.2)
Total Protein: 5.4 g/dL — ABNORMAL LOW (ref 6.5–8.1)

## 2021-12-28 LAB — PHOSPHORUS: Phosphorus: 3.4 mg/dL (ref 2.5–4.6)

## 2021-12-28 LAB — GLUCOSE, CAPILLARY
Glucose-Capillary: 120 mg/dL — ABNORMAL HIGH (ref 70–99)
Glucose-Capillary: 93 mg/dL (ref 70–99)
Glucose-Capillary: 89 mg/dL (ref 70–99)
Glucose-Capillary: 130 mg/dL — ABNORMAL HIGH (ref 70–99)

## 2021-12-28 LAB — MAGNESIUM: Magnesium: 2.3 mg/dL (ref 1.7–2.4)

## 2021-12-28 LAB — LACTIC ACID, PLASMA: Lactic Acid, Venous: 1.5 mmol/L (ref 0.5–1.9)

## 2021-12-28 MED ORDER — DIPHENHYDRAMINE HCL 50 MG/ML IJ SOLN
25.0000 mg | Freq: Four times a day (QID) | INTRAMUSCULAR | Status: DC | PRN
  Administered 2021-12-28: 25 mg via INTRAVENOUS
  Filled 2021-12-28: qty 1

## 2021-12-28 MED ORDER — POTASSIUM CHLORIDE CRYS ER 20 MEQ PO TBCR
40.0000 meq | EXTENDED_RELEASE_TABLET | Freq: Once | ORAL | Status: AC
  Administered 2021-12-28: 40 meq via ORAL
  Filled 2021-12-28: qty 2

## 2021-12-28 MED ORDER — LACTATED RINGERS IV BOLUS
1000.0000 mL | Freq: Once | INTRAVENOUS | Status: AC
  Administered 2021-12-28: 1000 mL via INTRAVENOUS

## 2021-12-28 MED ORDER — SODIUM CHLORIDE 0.9 % IV SOLN
2.0000 g | Freq: Three times a day (TID) | INTRAVENOUS | Status: DC
  Administered 2021-12-29: 2 g via INTRAVENOUS
  Filled 2021-12-28: qty 12.5

## 2021-12-28 NOTE — Evaluation (Signed)
Occupational Therapy Evaluation Patient Details Name: Jennifer [name] MRN: [id number] DOB: 06/12/1966 Today's Date: 12/28/2021   History of Present Illness 56 yo female admitted 5/21 with septic shock due to UTI, E. coli bacteremia in the setting of obstructive ureteral stone status post stent placement, HTN. PMhx: obesity, eczema, depression, HLD   Clinical Impression   PTA, pt was living alone and was independent with ADLs, IADLs, and working full time. Pt currently performing at [place] level for ADLs and functional mobility with RW. Pt presenting with decreased activity tolerance as seen by fatigue and "shakiness". Pt would benefit from further acute OT to increase independence and facilitate safe dc. Pending pt progress, recommend dc to home with HHOT to optimize safety and return to PLOF.   SpO2 94% on RA. HR 73. BP Sitting 99/66 and supine BP 95/40.   Recommendations for follow up therapy are one component of a multi-disciplinary discharge planning process, led by the attending physician.  Recommendations may be updated based on patient status, additional functional criteria and insurance authorization.   Follow Up Recommendations  Home health OT (Pending progress)    Assistance Recommended at Discharge Frequent or constant Supervision/Assistance  Patient can return home with the following A little help with walking and/or transfers;A little help with bathing/dressing/bathroom    Functional Status Assessment  Patient has had a recent decline in their functional status and demonstrates the ability to make significant improvements in function in a reasonable and predictable amount of time.  Equipment Recommendations  Tub/shower seat    Recommendations for Other Services PT consult     Precautions / Restrictions Precautions Precautions: Fall;Other (comment) Precaution Comments: watch bp      Mobility Bed Mobility Overal bed mobility: Needs Assistance Bed Mobility: Sit  to Supine     Supine to sit: Min guard Sit to supine: Supervision   General bed mobility comments: HOB flat, increased time and effort    Transfers Overall transfer level: Needs assistance Equipment used: Rolling walker (2 wheels), None Transfers: Sit to/from Stand Sit to Stand: Min guard           General transfer comment: Min Guard A for safety      Balance Overall balance assessment: Needs assistance   Sitting balance-Leahy Scale: Good Sitting balance - Comments: static sitting without support   Standing balance support: Single extremity supported, Bilateral upper extremity supported Standing balance-Leahy Scale: Fair Standing balance comment: Performing grooming at sink                           ADL either performed or assessed with clinical judgement   ADL Overall ADL's : Needs assistance/impaired Eating/Feeding: Set up;Sitting   Grooming: Oral care;Wash/dry face;Min guard;Standing   Upper Body Bathing: Supervision/ safety;Set up;Sitting   Lower Body Bathing: Min guard;Sit to/from stand   Upper Body Dressing : Supervision/safety;Set up;Sitting   Lower Body Dressing: Min guard;Sit to/from stand Lower Body Dressing Details (indicate cue type and reason): Figure four for doffing socks Toilet Transfer: Min guard;Ambulation;Rolling walker (2 wheels);Regular Toilet   Toileting- Clothing Manipulation and Hygiene: Min guard;Sitting/lateral lean;Sit to/from stand       Functional mobility during ADLs: Min guard;Rolling walker (2 wheels) General ADL Comments: Pt presenting with decreased activity tolerance and strength as seen by fatigue.     Vision         Perception     Praxis      Pertinent Vitals/Pain Pain  Assessment Pain Assessment: No/denies pain     Hand Dominance Right   Extremity/Trunk Assessment Upper Extremity Assessment Upper Extremity Assessment: Overall WFL for tasks assessed   Lower Extremity Assessment Lower Extremity  Assessment: Generalized weakness   Cervical / Trunk Assessment Cervical / Trunk Assessment: Normal   Communication Communication Communication: No difficulties   Cognition Arousal/Alertness: Awake/alert Behavior During Therapy: WFL for tasks assessed/performed Overall Cognitive Status: Within Functional Limits for tasks assessed                                 General Comments: Very motivated     General Comments  SpO2 94% on RA. HR 73. BP Sitting 99/66 and supine BP 95/40    Exercises     Shoulder Instructions      Home Living Family/patient expects to be discharged to:: Private residence Living Arrangements: Alone Available Help at Discharge: Family;Available PRN/intermittently Type of Home: House Home Access: Stairs to enter [employer] of Steps: 3 Entrance Stairs-Rails: Left Home Layout: One level     Bathroom Shower/Tub: Walk-in shower;Tub/shower unit   Bathroom Toilet: Handicapped height     Home Equipment: None          Prior Functioning/Environment Prior Level of Function : Independent/Modified Independent               ADLs Comments: Works at [employer]. Drives. Enjoys spending time with her granddaughter        OT Problem List: Decreased strength;Decreased activity tolerance;Decreased knowledge of precautions;Decreased knowledge of use of DME or AE      OT Treatment/Interventions: Self-care/ADL training;Therapeutic exercise;Energy conservation;DME and/or AE instruction;Therapeutic activities;Patient/family education    OT Goals(Current goals can be found in the care plan section) Acute Rehab OT Goals Patient Stated Goal: Go home OT Goal Formulation: With patient Time For Goal Achievement: 01/11/22 Potential to Achieve Goals: Good  OT Frequency: Min 3X/week    Co-evaluation              AM-PAC OT "6 Clicks" Daily Activity     Outcome Measure Help from another person eating meals?: A Little Help from  another person taking care of personal grooming?: A Little Help from another person toileting, which includes using toliet, bedpan, or urinal?: A Little Help from another person bathing (including washing, rinsing, drying)?: A Little Help from another person to put on and taking off regular upper body clothing?: A Little Help from another person to put on and taking off regular lower body clothing?: A Little 6 Click Score: 18   End of Session Equipment Utilized During Treatment: Gait belt;Rolling walker (2 wheels) Nurse Communication: Mobility status  Activity Tolerance: Patient tolerated treatment well Patient left: in bed;with call bell/phone within reach;with bed alarm set  OT Visit Diagnosis: Unsteadiness on feet (R26.81);Other abnormalities of gait and mobility (R26.89);Muscle weakness (generalized) (M62.81)                Time: 9163-8466 OT Time Calculation (min): 28 min Charges:  OT General Charges $OT Visit: 1 Visit OT Evaluation $OT Eval Moderate Complexity: 1 Mod OT Treatments $Self Care/Home Management : 8-22 mins  Charnele Semple MSOT, OTR/L Acute Rehab Pager: [phone] Office: [place] 12/28/2021, 3:35 PM

## 2021-12-28 NOTE — Progress Notes (Signed)
[facility] ADULT ICU REPLACEMENT PROTOCOL   The patient does apply for the [facility] Adult ICU Electrolyte Replacment Protocol based on the criteria listed below:   1.Exclusion criteria: TCTS patients, ECMO patients, and Dialysis patients 2. Is GFR >/= 30 ml/min? Yes.    Patient's GFR today is >60 3. Is SCr </= 2? Yes.   Patient's SCr is 0.84 mg/dL 4. Did SCr increase >/= 0.5 in 24 hours? No. 5.Pt's weight >40kg  Yes.   6. Abnormal electrolyte(s): K+ 3.5  7. Electrolytes replaced per protocol 8.  Call MD STAT for K+ </= 2.5, Phos </= 1, or Mag </= 1 Physician:  Judieth Keens 12/28/2021 3:28 AM

## 2021-12-28 NOTE — Progress Notes (Signed)
NAME:  Jennifer [name], MRN:  [id number], DOB:  10/10/1965, LOS: 2 ADMISSION DATE:  12/26/2021, CONSULTATION DATE:  12/26/21 REFERRING MD:  Orlan Leavens, CHIEF COMPLAINT:  septic shock   History of Present Illness:  Jennifer [name] is a 56 y/o woman with a history of depression who presented with 2 days of malaise and fatigue and today developed n/v with low back pain and fevers. She has not had recent antibiotics and is not on immunosuppressive meds. She has had similar pain in the past with kidney stones. She reports a fever of almost 105 with EMS. In the ED she developed hypotension requiring norepinephrine to be restarted. She was given cefepime, vanc, flagyl after blood cultures were drawn in the ED.   Pertinent  Medical History  Depression Diverticulosis Tobacco abuse  Significant Hospital Events: Including procedures, antibiotic start and stop dates in addition to other pertinent events   5/21 admission 5/21 underwent ureteral stent placement 5/22 narrowed abx to CFTX for E coli bacteremia  Interim History / Subjective:   Evaluated Jennifer [name] at bedside. She is feeling a lot better. Happy that she has not required pressors. States that her WBC count has always been slightly higher than normal.   Discussed plan for today including possible transfer out of hospital should her blood pressures remain stable. She agrees with plan.  Objective   Blood pressure (!) 101/58, pulse (!) 53, temperature (!) 97 F (36.1 C), temperature source Axillary, resp. rate (!) 22, height '5\' 8"'$  (1.727 m), weight 97.5 kg, SpO2 93 %.        Intake/Output Summary (Last 24 hours) at 12/28/2021 0824 Last data filed at 12/28/2021 0700 Gross per 24 hour  Intake 4328.38 ml  Output 4200 ml  Net 128.38 ml   Filed Weights   12/26/21 1524  Weight: 97.5 kg    Examination: General: pleasant middle aged female, sitting up in bed, NAD. HENT: anicteric sclera, MMM. CV: normal rate and regular rhythm,  no m/r/g. Pulm: CTABL, no adventitious sounds noted. Normal WOB on RA. Abdomen: soft, nondistended, nontender, +BS. Extremities: no peripheral edema noted. Skin: warm and dry. Neuro: AAOx3, no focal deficits noted.  Resolved Hospital Problem list   AKI  Assessment & Plan:  L Obstructive Ureteral Stone s/p Stent Placement 5/21 Lactic acidosis E coli bacteremia 2/2 UTI Septic Shock She is now POD #2 following L ureteral stent placement per urology, appreciate assistance. She will need a second procedure for ureteral stone removal +/- stent removal, but this can be done as an outpatient. She is feeling better. Off pressors, MAPs have been fluctuating between 63 - 70, which is acceptable. Leukocytosis is improving and lactate has cleared. If BP remains stable, plan to transfer out of ICU later today. -continue CFTX -encourage PO intake -pain control with tylenol and oxycodone prn -discontinued fentanyl -PT/OT eval -plan for transfer out of ICU later today if BP remains stable  AKI - resolved Several potential etiologies, including prerenal from decreased PO intake versus ATN 2/2 septic shock versus post-obstructive 2/2 hydronephrosis from ureteral stone. She is s/p ureteral stent placement and has been receiving IVF for rehydration. Kidney function back to baseline and stable.  -received 1L IVF bolus to facilitate weaning off levo overnight -encourage PO intake -strict I/O's, monitor UOP  Hypokalemia - resolved Hypomagnesemia - resolved Hypophosphatemia - resolved -monitor electrolytes, replete as needed  Hyperglycemia CBGs at goal overnight. A1c 5.2%, indicating adequate glycemic control.  -continue ACHS moderate SSI -goal CBG 140-180  Hyperbilirubinemia Hepatic Steatosis on CT -monitor, supportive care  Depression -continue lithium and paxil  Tobacco abuse - 1/2 ppd, ongoing -counseled on the importance of quitting, does not want a nicotine patch currently -already  enrolled in lung cancer screening  Best Practice (right click and "Reselect all SmartList Selections" daily)   Diet/type: Clear liquids DVT prophylaxis: prophylactic heparin  GI prophylaxis: N/A Lines: N/A Foley:  In place Code Status:  DNR Last date of multidisciplinary goals of care discussion [5/22 - updated patient, mother, and significant other at bedside. Surrogate decision maker is daughter, Amanda]  Critical care time: 32 min.    Virl Axe, MD IMTS PGY-2 12/28/21 8:24 AM

## 2021-12-28 NOTE — Progress Notes (Signed)
[facility] Progress Note Patient Name: Jennifer [name] DOB: 02/17/1966 MRN: [id number]   Date of Service  12/28/2021  HPI/Events of Note  1L LR bolus ordered to facilitate weaning levophed  eICU Interventions       Intervention Category Intermediate Interventions: Hypotension - evaluation and management  Jennifer [name] 12/28/2021, 3:37 AM

## 2021-12-28 NOTE — Evaluation (Signed)
Physical Therapy Evaluation Patient Details Name: Jennifer [name] MRN: [id number] DOB: Apr 05, 1966 Today's Date: 12/28/2021  History of Present Illness  56 yo female admitted 5/21 with septic shock due to UTI, E. coli bacteremia in the setting of obstructive ureteral stone status post stent placement, HTN. PMhx: obesity, eczema, depression, HLD  Clinical Impression  Pt pleasant, cooperative and wanting to return to independence. Pt with maintained low BP but able to tolerate limited gait and up to chair. Pt with decreased strength, functional mobility and gait who will benefit from acute therapy to maximize mobility and safety. Pt works at [employer] at baseline, lives alone and has 2 dogs. Pt reports family can assist at D/C.   Supine 86/54 (65), HR 69 Sitting 97/83, HR 75 After gait 91/49 (62)       Recommendations for follow up therapy are one component of a multi-disciplinary discharge planning process, led by the attending physician.  Recommendations may be updated based on patient status, additional functional criteria and insurance authorization.  Follow Up Recommendations Home health PT    Assistance Recommended at Discharge Intermittent Supervision/Assistance  Patient can return home with the following  A little help with walking and/or transfers;A little help with bathing/dressing/bathroom;Assistance with cooking/housework;Help with stairs or ramp for entrance    Equipment Recommendations Rolling walker (2 wheels)  Recommendations for Other Services       Functional Status Assessment Patient has had a recent decline in their functional status and demonstrates the ability to make significant improvements in function in a reasonable and predictable amount of time.     Precautions / Restrictions Precautions Precautions: Fall;Other (comment) Precaution Comments: watch bp      Mobility  Bed Mobility Overal bed mobility: Needs Assistance Bed Mobility: Supine to Sit      Supine to sit: Min guard     General bed mobility comments: HOB flat, increased time and effort    Transfers Overall transfer level: Needs assistance   Transfers: Sit to/from Stand Sit to Stand: Min guard                Ambulation/Gait Ambulation/Gait assistance: Min assist, Min guard Gait Distance (Feet): 60 Feet Assistive device: Rolling walker (2 wheels), 1 person hand held assist Gait Pattern/deviations: Step-through pattern, Decreased stride length   Gait velocity interpretation: <1.8 ft/sec, indicate of risk for recurrent falls   General Gait Details: pt initially denied RW and using bed rail and HHA to ambulate 60' with unsteady shaking gait.  Pt then performed 23' with RW with minguard and cues for sequence and safety  Stairs            Wheelchair Mobility    Modified Rankin (Stroke Patients Only)       Balance Overall balance assessment: Needs assistance   Sitting balance-Leahy Scale: Good Sitting balance - Comments: static sitting without support   Standing balance support: Single extremity supported, Bilateral upper extremity supported Standing balance-Leahy Scale: Poor Standing balance comment: UE support for standing                             Pertinent Vitals/Pain Pain Assessment Pain Assessment: No/denies pain    Home Living Family/patient expects to be discharged to:: Private residence Living Arrangements: Alone Available Help at Discharge: Family;Available PRN/intermittently Type of Home: House Home Access: Stairs to enter Entrance Stairs-Rails: Left Entrance Stairs-Number of Steps: 3   Home Layout: One level Home Equipment: None  Prior Function Prior Level of Function : Independent/Modified Independent                     Hand Dominance        Extremity/Trunk Assessment   Upper Extremity Assessment Upper Extremity Assessment: Overall WFL for tasks assessed    Lower Extremity  Assessment Lower Extremity Assessment: Generalized weakness    Cervical / Trunk Assessment Cervical / Trunk Assessment: Normal  Communication   Communication: No difficulties  Cognition Arousal/Alertness: Awake/alert Behavior During Therapy: WFL for tasks assessed/performed Overall Cognitive Status: Within Functional Limits for tasks assessed                                          General Comments      Exercises     Assessment/Plan    PT Assessment Patient needs continued PT services  PT Problem List Decreased strength;Decreased mobility;Decreased activity tolerance;Decreased balance;Decreased knowledge of use of DME;Cardiopulmonary status limiting activity       PT Treatment Interventions Gait training;Balance training;Stair training;Functional mobility training;Therapeutic activities;Patient/family education;Therapeutic exercise;DME instruction    PT Goals (Current goals can be found in the Care Plan section)  Acute Rehab PT Goals Patient Stated Goal: 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 PT Goal Formulation: With patient Time For Goal Achievement: 01/11/22 Potential to Achieve Goals: Good    Frequency Min 3X/week     Co-evaluation               AM-PAC PT "6 Clicks" Mobility  Outcome Measure Help needed turning from your back to your side while in a flat bed without using bedrails?: A Little Help needed moving from lying on your back to sitting on the side of a flat bed without using bedrails?: A Little Help needed moving to and from a bed to a chair (including a wheelchair)?: A Little Help needed standing up from a chair using your arms (e.g., wheelchair or bedside chair)?: A Little Help needed to walk in hospital room?: A Little Help needed climbing 3-5 steps with a railing? : A Lot 6 Click Score: 17    End of Session Equipment Utilized During Treatment: Gait belt Activity Tolerance: Patient tolerated treatment well Patient left: in  chair;with call bell/phone within reach;with chair alarm set Nurse Communication: Mobility status PT Visit Diagnosis: Other abnormalities of gait and mobility (R26.89);Difficulty in walking, not elsewhere classified (R26.2);Muscle weakness (generalized) (M62.81)    Time: 0258-5277 PT Time Calculation (min) (ACUTE ONLY): 29 min   Charges:   PT Evaluation $PT Eval Moderate Complexity: 1 Mod PT Treatments $Gait Training: 8-22 mins        Jehad Bisono P, PT Acute Rehabilitation Services Pager: [phone] Office: [place] 12/28/2021, 1:57 PM

## 2021-12-28 NOTE — Progress Notes (Signed)
eLink Physician-Brief Progress Note Patient Name: Jennifer [name] DOB: [birth year] MRN: [id number]   Date of Service  12/28/2021  HPI/Events of Note  Patient with new diffuse blaching rash on chest, upper shoulders and back, legs and knees Denies throat swelling or difficulty swallowing Started ceftriaxone today. Pt reports remote hx of reaction to ceftriaxone in the past. Tolerated cefepime earlier this admission Discussed with pharmacy  eICU Interventions  STOP ceftriaxone Restart Cefepime Start PRN benadryl     Intervention Category Intermediate Interventions: Other:  Parry Po Rodman Pickle 12/28/2021, 9:44 PM

## 2021-12-29 ENCOUNTER — Other Ambulatory Visit (HOSPITAL_COMMUNITY): Payer: Self-pay

## 2021-12-29 DIAGNOSIS — R6521 Severe sepsis with septic shock: Secondary | ICD-10-CM | POA: Diagnosis not present

## 2021-12-29 DIAGNOSIS — A419 Sepsis, unspecified organism: Secondary | ICD-10-CM | POA: Diagnosis not present

## 2021-12-29 DIAGNOSIS — N179 Acute kidney failure, unspecified: Secondary | ICD-10-CM

## 2021-12-29 LAB — COMPREHENSIVE METABOLIC PANEL
ALT: 17 U/L (ref 0–44)
AST: 17 U/L (ref 15–41)
Albumin: 2.6 g/dL — ABNORMAL LOW (ref 3.5–5.0)
Alkaline Phosphatase: 79 U/L (ref 38–126)
Anion gap: 8 (ref 5–15)
BUN: 9 mg/dL (ref 6–20)
CO2: 22 mmol/L (ref 22–32)
Calcium: 8.1 mg/dL — ABNORMAL LOW (ref 8.9–10.3)
Chloride: 111 mmol/L (ref 98–111)
Creatinine, Ser: 0.83 mg/dL (ref 0.44–1.00)
GFR, Estimated: >60 mL/min (ref >60)
Glucose, Bld: 68 mg/dL — ABNORMAL LOW (ref 70–99)
Potassium: 3.9 mmol/L (ref 3.5–5.1)
Sodium: 141 mmol/L (ref 135–145)
Total Bilirubin: 0.6 mg/dL (ref 0.3–1.2)
Total Protein: 5.1 g/dL — ABNORMAL LOW (ref 6.5–8.1)

## 2021-12-29 LAB — URINE CULTURE: Culture: 20000 — AB

## 2021-12-29 LAB — CBC WITH DIFFERENTIAL/PLATELET
Abs Immature Granulocytes: 0.16 10*3/uL — ABNORMAL HIGH (ref 0.00–0.07)
Basophils Absolute: 0 10*3/uL (ref 0.0–0.1)
Basophils Relative: 0 %
Eosinophils Absolute: 0.2 10*3/uL (ref 0.0–0.5)
Eosinophils Relative: 1 %
HCT: 37 % (ref 36.0–46.0)
Hemoglobin: 12.4 g/dL (ref 12.0–15.0)
Immature Granulocytes: 1 %
Lymphocytes Relative: 21 %
Lymphs Abs: 4.3 10*3/uL — ABNORMAL HIGH (ref 0.7–4.0)
MCH: 29.8 pg (ref 26.0–34.0)
MCHC: 33.5 g/dL (ref 30.0–36.0)
MCV: 88.9 fL (ref 80.0–100.0)
Monocytes Absolute: 0.8 10*3/uL (ref 0.1–1.0)
Monocytes Relative: 4 %
Neutro Abs: 14.8 10*3/uL — ABNORMAL HIGH (ref 1.7–7.7)
Neutrophils Relative %: 73 %
Platelets: 192 10*3/uL (ref 150–400)
RBC: 4.16 MIL/uL (ref 3.87–5.11)
RDW: 17.1 % — ABNORMAL HIGH (ref 11.5–15.5)
WBC: 20.2 10*3/uL — ABNORMAL HIGH (ref 4.0–10.5)
nRBC: 0 % (ref 0.0–0.2)

## 2021-12-29 LAB — GLUCOSE, CAPILLARY
Glucose-Capillary: 101 mg/dL — ABNORMAL HIGH (ref 70–99)
Glucose-Capillary: 81 mg/dL (ref 70–99)

## 2021-12-29 LAB — CULTURE, BLOOD (ROUTINE X 2)
Special Requests: ADEQUATE
Special Requests: ADEQUATE

## 2021-12-29 MED ORDER — AMOXICILLIN-POT CLAVULANATE 875-125 MG PO TABS
1.0000 | ORAL_TABLET | Freq: Two times a day (BID) | ORAL | Status: DC
  Administered 2021-12-29: 1 via ORAL
  Filled 2021-12-29: qty 1

## 2021-12-29 MED ORDER — NYSTATIN 100000 UNIT/ML MT SUSP
5.0000 mL | Freq: Three times a day (TID) | OROMUCOSAL | Status: DC
  Administered 2021-12-29 (×2): 500000 [IU] via ORAL
  Filled 2021-12-29 (×2): qty 5

## 2021-12-29 MED ORDER — NYSTATIN 100000 UNIT/ML MT SUSP
5.0000 mL | Freq: Four times a day (QID) | OROMUCOSAL | 0 refills | Status: DC
  Filled 2021-12-29: qty 200, 10d supply, fill #0

## 2021-12-29 MED ORDER — AMOXICILLIN-POT CLAVULANATE 875-125 MG PO TABS
1.0000 | ORAL_TABLET | Freq: Two times a day (BID) | ORAL | 0 refills | Status: DC
  Filled 2021-12-29: qty 13, 7d supply, fill #0

## 2021-12-29 NOTE — TOC Initial Note (Signed)
Transition of Care [facility]) - Initial/Assessment Note    Patient Details  Name: Jennifer [name] MRN: [id number] Date of Birth: [birth year]  [facility]) CM/SW Contact:    Angelita Ingles, RN Phone [phone]  12/29/2021, 1:11 PM  Clinical Narrative:                 [facility] consulted for patient discharging home with DME and home health recommendations. Patient and daughter at bedside. CM to discuss home health recommendations. Patient states that she honestly feels that she will be fine once she gets home in her own surrounding and she will be fine. Patient states that daughter will be staying with her for a few days and after that she can depend on her mother and sisters to assist her. Patient is agreeable to rolling walker. Gilford Rile has been ordered per [employer] and to be delivered to the room. No other needs noted at this time.   Expected Discharge Plan: Home/Self Care Barriers to Discharge: No Barriers Identified   Patient Goals and CMS Choice Patient states their goals for this hospitalization and ongoing recovery are:: Ready to go home CMS Medicare.gov Compare Post Acute Care list provided to:: Patient Choice offered to / list presented to : Patient (Patient feels that she does not neet home health and will be fine once she gets home in familiar surroundings.)  Expected Discharge Plan and Services Expected Discharge Plan: Home/Self Care In-house Referral: NA Discharge Planning Services: CM Consult Post Acute Care Choice: [place] arrangements for the past 2 months: Single Family Home Expected Discharge Date: 12/29/21               DME Arranged: Gilford Rile rolling DME Agency: NA       HH Arranged: NA          Prior Living Arrangements/Services Living arrangements for the past 2 months: Single Family Home Lives with:: Self (daughter planning to stay with her for the next few days) Patient language and need for interpreter reviewed:: Yes Do you feel safe  going back to the place where you live?: Yes      Need for Family Participation in Patient Care: Yes (Comment) Care giver support system in place?: Yes (comment) Current home services:  (n/a) Criminal Activity/Legal Involvement Pertinent to Current Situation/Hospitalization: No - Comment as needed  Activities of Daily Living      Permission Sought/Granted   Permission granted to share information with : No              Emotional Assessment Appearance:: Appears stated age Attitude/Demeanor/Rapport: Gracious Affect (typically observed): Accepting, Pleasant Orientation: : Oriented to Self, Oriented to Place, Oriented to  Time, Oriented to Situation Alcohol / Substance Use: Not Applicable Psych Involvement: No (comment)  Admission diagnosis:  Hypokalemia [E87.6] Lactic acid acidosis [E87.20] Acute cystitis with hematuria [N30.01] Ureteral stone with hydronephrosis [N13.2] Septic shock (HCC) [A41.9, R65.21] Altered mental status, unspecified altered mental status type [R41.82] Patient Active Problem List   Diagnosis Date Noted   Osteopenia 09/29/2020   Chronic cough 03/26/2020   Aortic atherosclerosis ([place]) 09/12/2019   Reactive airway disease 09/12/2019   Hyperlipidemia 09/12/2019   Insomnia 02/18/2016   GAD (generalized anxiety disorder) 02/18/2016   Severe episode of recurrent major depressive disorder, without psychotic features ([place]) 02/18/2016   Depression with anxiety 10/15/2014   Personal history of adenomatous colonic polyps 05/08/2012   LOW HDL 02/28/2007   OBESITY 02/28/2007   Tobacco use 02/21/2007   ECZEMA  02/21/2007   ROSACEA 02/21/2007   PCP:  [name], MD Pharmacy:   [id number] - [place], [place] - [address] [place] [address] Phone: [phone] Fax: [phone]  [id number] [place] [place] [address] [place]Phone: [phone]: [phone] [facility] [address]Phone: [phone]: [phone]    Social Determinants of Health (SDOH) Interventions    Readmission Risk Interventions     View : No data to display.

## 2021-12-29 NOTE — Discharge Summary (Addendum)
Physician Discharge Summary         Patient ID: Jennifer [name] MRN: [id number] DOB/AGE: [birth year] 58 y.o.  Admit date: 12/26/2021 Discharge date: 12/29/2021  Discharge Diagnoses:    Active Hospital Problems  No active problems to display.    Resolved Hospital Problems   Diagnosis Date Noted Date Resolved   Septic shock [facility]) 12/26/2021 12/29/2021    Priority: High   AKI (acute kidney injury) [facility]) 12/29/2021 12/29/2021      Discharge summary    Ms. Jennifer [name] is a 56 year old woman with history of depression, diverticulosis, and tobacco use who presented with fatigue, N/V, fever and developed hypotension requiring pressor support. She was found to have septic shock 2/2 E coli bacteremia/UTI and had a left-sided 70m ureteral stone. Urology was consulted and performed cysto with ureteral stent placement with plan for second procedure for stone removal as an outpatient. She was also started on broad-spectrum antibiotics while awaiting sensitivities. She received IVF rehydration until able to tolerate PO intake. She was gradually weaned off of levophed and has maintained MAP >65 for the past 2 days without pressor support. Has also remained afebrile with improving leukocytosis during this time. Blood cultures and urine cultures grew pan-sensitive E coli. Will discharge with 7-day course of augmentin to complete course. PT/OT recommending HH PT/OT.  Patient also had an AKI on admission, likely secondary to ureteral stone, dehydration/decreased PO intake, and septic shock. This quickly improved with IVF rehydration. Kidney function is back to her baseline.    Discharge Plan by Active Problems    E coli bacteremia/UTI -- complete 7 additional days of Augmentin BID (last day 01/04/22). HH PT/OT ordered along with home tub bench and rolling walker. F/u with PCP, Dr. CEinar Pheasant in 1-2 weeks for repeat labs to ensure resolution of infection.  Oral Thrush -- nystatin swish and swallow QID x7  days.  Tobacco use disorder -- will need continued smoking cessation counseling  Depression - resume home lithium and paxil  Procedures    Cystoscopy, left retrograde pyelogram, left ureteral stent placement (12/26/21)  Culture data/antimicrobials    Blood Culture    Component Value Date/Time   SDES IN/OUT CATH URINE 12/26/2021 2059   SPECREQUEST  12/26/2021 2059    NONE [facility]formed at [facility] [id number] [place] [address]   CULT 20,000 COLONIES/mL ESCHERICHIA COLI (A) 12/26/2021 2059   REPTSTATUS 12/29/2021 FINAL 12/26/2021 2059   Urine Culture - Pan-sensitive E coli  Consults   Urology - Dr. HLouis Meckel    Discharge Exam: BP 108/63   Pulse 67   Temp 97.9 F (36.6 C) (Oral)   Resp (!) 26   Ht '5\' 8"'$  (1.727 m)   Wt 97.5 kg   SpO2 93%   BMI 32.69 kg/m  General: pleasant middle aged female, sitting up in bed, NAD. HENT: anicteric sclera, MMM. CV: normal rate and regular rhythm, no m/r/g. Pulm: CTABL, no adventitious sounds noted. Normal WOB on RA. Abdomen: soft, nondistended, nontender, normoactive bowel sounds. Extremities: no peripheral edema noted. Skin: warm and dry. Neuro: AAOx3, no focal deficits noted.   Labs at discharge   Lab Results  Component Value Date   CREATININE 0.83 12/29/2021   BUN 9 12/29/2021   NA 141 12/29/2021   K 3.9 12/29/2021   CL 111 12/29/2021   CO2 22 12/29/2021   Lab Results  Component Value Date   WBC 20.2 (H) 12/29/2021   HGB 12.4 12/29/2021  HCT 37.0 12/29/2021   MCV 88.9 12/29/2021   PLT 192 12/29/2021   Lab Results  Component Value Date   ALT 17 12/29/2021   AST 17 12/29/2021   ALKPHOS 79 12/29/2021   BILITOT 0.6 12/29/2021   Lab Results  Component Value Date   INR 1.1 12/26/2021   INR 1.22 10/24/2011   Lactic Acid, Venous    Component Value Date/Time   LATICACIDVEN 1.5 12/28/2021 0221   Hemoglobin A1c - 5.2%  Drugs of Abuse     Component Value Date/Time   LABOPIA NONE  DETECTED 12/26/2021 2059   COCAINSCRNUR NONE DETECTED 12/26/2021 2059   LABBENZ NONE DETECTED 12/26/2021 2059   AMPHETMU NONE DETECTED 12/26/2021 2059   THCU NONE DETECTED 12/26/2021 2059   LABBARB NONE DETECTED 12/26/2021 2059     Current radiological studies    CT HEAD WO CONTRAST (5MM)  Result Date: 12/26/2021 CLINICAL DATA:  Delirium EXAM: CT HEAD WITHOUT CONTRAST TECHNIQUE: Contiguous axial images were obtained from the base of the skull through the vertex without intravenous contrast. RADIATION DOSE REDUCTION: This exam was performed according to the departmental dose-optimization program which includes automated exposure control, adjustment of the mA and/or kV according to patient size and/or use of iterative reconstruction technique. COMPARISON:  09/11/2004 FINDINGS: Brain: No evidence of acute infarction, hemorrhage, hydrocephalus, extra-axial collection or mass lesion/mass effect. Unchanged perivascular space inferior to the left basal ganglia. Vascular: No hyperdense vessel or unexpected calcification. Skull: Normal. Negative for fracture or focal lesion. Sinuses/Orbits: Prominent mucosal thickening within the left maxillary sinus with near complete opacification. Other: None. IMPRESSION: 1. No acute intracranial abnormality. 2. Left maxillary sinus disease. Electronically Signed   By: [name] Poke D.O.   On: 12/26/2021 17:41   CT ABDOMEN PELVIS W CONTRAST  Result Date: 12/26/2021 CLINICAL DATA:  Delirium, sepsis. EXAM: CT ABDOMEN AND PELVIS WITH CONTRAST TECHNIQUE: Multidetector CT imaging of the abdomen and pelvis was performed using the standard protocol following bolus administration of intravenous contrast. RADIATION DOSE REDUCTION: This exam was performed according to the departmental dose-optimization program which includes automated exposure control, adjustment of the mA and/or kV according to patient size and/or use of iterative reconstruction technique. CONTRAST:  181m  OMNIPAQUE IOHEXOL 300 MG/ML  SOLN COMPARISON:  CT abdomen pelvis dated 07/19/2013. FINDINGS: Lower chest: Mild bilateral dependent atelectasis. Hepatobiliary: No focal liver abnormality is seen. The liver is hypoattenuating relative to the spleen which may reflect hepatic steatosis. No gallstones, gallbladder wall thickening, or biliary dilatation. Pancreas: Unremarkable. No pancreatic ductal dilatation or surrounding inflammatory changes. Spleen: Normal in size without focal abnormality. Adrenals/Urinary Tract: Adrenal glands are unremarkable. There is an obstructing 6 mm calculus in the proximal third of the left ureter resulting in mild left hydroureteronephrosis. Multiple bilateral nonobstructing renal calculi measure up to 2 mm in size. There is no focal lesion of the right kidney and there is no right hydronephrosis. Bladder is unremarkable. Stomach/Bowel: Stomach is within normal limits. Appendix appears normal. No evidence of bowel wall thickening, distention, or inflammatory changes. Vascular/Lymphatic: Aortic atherosclerosis. No enlarged abdominal or pelvic lymph nodes. Reproductive: Status post hysterectomy. No adnexal masses. Other: No abdominal wall hernia or abnormality. No abdominopelvic ascites. Musculoskeletal: Degenerative changes are seen in the spine. IMPRESSION: 1. Obstructing 6 mm calculus in the proximal third of the left ureter resulting in mild left hydroureteronephrosis. 2. Findings suggestive of hepatic steatosis. Aortic Atherosclerosis (ICD10-I70.0). Electronically Signed   By: TZerita BoersM.D.   On: 12/26/2021 17:51   DG  Retrograde Pyelogram  Result Date: 12/26/2021 CLINICAL DATA:  Left ureteral stone on CT today. EXAM: RETROGRADE PYELOGRAM COMPARISON:  CT with IV contrast earlier today. FINDINGS: Fluoro time: 34 seconds. Dose: 16.01 mGy. A single spot fluoroscopic image is provided revealing the pelvic portion of a left double-J ureteral stent with the distal loop in the bladder.  The CT demonstrated a 6 mm proximal left ureteral stone. The stent was placed apparently during this procedure. The abdominal portion of the stent is not evaluated. Refer to Dr. Carlton Adam operative note for further details and recommendations. IMPRESSION: The pelvic portion of a double-J left ureteral stent is noted on the single image with the distal loop in the bladder. No images of the proximal stent were submitted. Electronically Signed   By: Telford Nab M.D.   On: 12/26/2021 21:26   DG Chest Port 1 View  Result Date: 12/26/2021 CLINICAL DATA:  Questionable sepsis. EXAM: PORTABLE CHEST 1 VIEW COMPARISON:  August 10, 2017 FINDINGS: The heart size and mediastinal contours are within normal limits. Both lungs are clear. The visualized skeletal structures are unremarkable. IMPRESSION: No active disease. Electronically Signed   By: Dorise Bullion III M.D.   On: 12/26/2021 16:38     Disposition:    Discharge disposition: 01-Home or Self Care     Patient is medically stable for discharge to her Home.  Discharge Instructions     Call MD for:  persistant nausea and vomiting   Complete by: As directed    Call MD for:  redness, tenderness, or signs of infection (pain, swelling, redness, odor or green/yellow discharge around incision site)   Complete by: As directed    Call MD for:  severe uncontrolled pain   Complete by: As directed    Call MD for:  temperature >100.4   Complete by: As directed    Diet - low sodium heart healthy   Complete by: As directed    Discharge instructions   Complete by: As directed    Ms. Weimer, please be sure to do the following after leaving the hospital.  1. Take Augmentin (antibiotic) twice a day for 7 days (make sure to complete all 7 days). 2. Take Nystatin (for your throat) 3-4 times daily for 7 days (please complete full 7 days). 3. Go to your Urology appointment in 2 weeks (see on discharge paperwork) for follow up. 4. Make an appointment with your  primary care doctor in 1-2 weeks for follow up to make sure the infection has fully resolved.   Increase activity slowly   Complete by: As directed    No wound care   Complete by: As directed        Allergies as of 12/29/2021       Reactions   Ceftriaxone Rash   Significant full body rash,  Tolerated cefepime 5/22   Nitrofurantoin Monohyd Macro Nausea And Vomiting   Body aches   Sulfa Antibiotics Nausea Only, Rash   Fever and disorientation        Medication List     TAKE these medications    acetaminophen 325 MG tablet Commonly known as: TYLENOL Take 650 mg by mouth every 6 (six) hours as needed for moderate pain.   albuterol 108 (90 Base) MCG/ACT inhaler Commonly known as: VENTOLIN HFA Inhale 1-2 puffs into the lungs every 6 (six) hours as needed for wheezing or shortness of breath.   amoxicillin-clavulanate 875-125 MG tablet Commonly known as: AUGMENTIN Take 1 tablet by mouth every  12 (twelve) hours.   Aspirin Low Dose 81 MG tablet Generic drug: aspirin EC TAKE ONE TABLET BY MOUTH DAILY What changed: how much to take   atorvastatin 10 MG tablet Commonly known as: LIPITOR Take 1 tablet (10 mg total) by mouth daily. Office visit required for further refills. What changed: additional instructions   clindamycin 1 % gel Commonly known as: CLINDAGEL Apply 1 application. topically daily as needed (For lips).   ipratropium 0.06 % nasal spray Commonly known as: ATROVENT Place 1 spray into both nostrils daily as needed for rhinitis.   lithium carbonate 450 MG CR tablet Commonly known as: ESKALITH Take 1 tablet (450 mg total) by mouth 2 (two) times daily.   nystatin 100000 UNIT/ML suspension Commonly known as: MYCOSTATIN Take 5 mLs (500,000 Units total) by mouth 4 (four) times daily.   oxybutynin 5 MG 24 hr tablet Commonly known as: DITROPAN-XL Take 5 mg by mouth at bedtime.   PARoxetine 40 MG tablet Commonly known as: PAXIL Take 1.5 tablets (60 mg total)  by mouth every morning. What changed: when to take this   sodium fluoride 1.1 % Gel dental gel Commonly known as: FLUORISHIELD Place 1 application onto teeth every other day.   traMADol 50 MG tablet Commonly known as: Ultram Take 1-2 tablets (50-100 mg total) by mouth every 6 (six) hours as needed for moderate pain.   valACYclovir 1000 MG tablet Commonly known as: VALTREX Take 1,000 mg by mouth daily as needed (for fever blisters).               Durable Medical Equipment  (From admission, onward)           Start     Ordered   12/29/21 1025  For home use only DME Walker rolling  Once       Question Answer Comment  Walker: With 5 Inch Wheels   Patient needs a walker to treat with the following condition Initial physical therapy assessment      12/29/21 1026   Unscheduled  DME Shower stool  Once        12/29/21 1050             Follow-up appointment    Urology - in 2 weeks (arranged by urology)  PCP - in 1-2 weeks (secure chat message sent to PCP)  Discharge Condition:    Fair   35 minutes of time have been dedicated to discharge assessment, planning and discharge instructions.   Signed: Virl Axe, MD IMTS PGY-2 12/29/2021, 10:50 AM    PCCM:  56 yo FM, admitted for sepsis, UTI. Now improved   BP 116/61 (BP Location: Right Arm)   Pulse (!) 59   Temp 97.9 F (36.6 C) (Oral)   Resp (!) 23   Ht '5\' 8"'$  (1.727 m)   Wt 97.5 kg   SpO2 91%   BMI 32.69 kg/m   Gen: middle aged FM Neuro: alert following commands  Heart: RRR, s1 s2  Lungs: CTAB   Labs reviewed   A: E Coli, sepsis  UTI  Tobacco use   P:  Complete augmentin BID, last dose 5/30  Smoking cessation counseling  Outpatient follow up with PCP within 1 week.    Garner Nash, DO [facility] 12/29/2021 1:32 PM

## 2021-12-29 NOTE — Progress Notes (Signed)
Physical Therapy Treatment Patient Details Name: Jennifer [name] MRN: [id number] DOB: 21-Jan-1966 Today's Date: 12/29/2021   History of Present Illness 56 yo female admitted 5/21 with septic shock due to UTI, E. coli bacteremia in the setting of obstructive ureteral stone status post stent placement, HTN. PMhx: obesity, eczema, depression, HLD    PT Comments    Focus of session on stair training. Pt able to ascend/descend steps with handrail with min guard and able to ambulate in room without AD and min guard assist. Pt mobilizing at a level that does not require additional PT services but she would benefit from RW for steadiness with longer distance ambulation. Pt looking forward to discharge. Daughter to stay with her first night.    Recommendations for follow up therapy are one component of a multi-disciplinary discharge planning process, led by the attending physician.  Recommendations may be updated based on patient status, additional functional criteria and insurance authorization.  Follow Up Recommendations  No PT follow up     Assistance Recommended at Discharge Set up Supervision/Assistance  Patient can return home with the following A little help with walking and/or transfers;A little help with bathing/dressing/bathroom;Assistance with cooking/housework;Help with stairs or ramp for entrance   Equipment Recommendations  Rolling walker (2 wheels)    Recommendations for Other Services       Precautions / Restrictions Precautions Precautions: Fall;Other (comment) Precaution Comments: watch bp Restrictions Weight Bearing Restrictions: No     Mobility  Bed Mobility Overal bed mobility: Needs Assistance Bed Mobility: Sit to Supine     Supine to sit: Supervision Sit to supine: Supervision   General bed mobility comments: supervision for safety    Transfers Overall transfer level: Needs assistance Equipment used: Rolling walker (2 wheels), None Transfers: Sit to/from  Stand Sit to Stand: Min guard           General transfer comment: Min Guard A for safety    Ambulation/Gait Ambulation/Gait assistance: Counsellor (Feet): 18 Feet Assistive device: Rolling walker (2 wheels), None Gait Pattern/deviations: Step-through pattern, Decreased stride length Gait velocity: slowed Gait velocity interpretation: <1.8 ft/sec, indicate of risk for recurrent falls   General Gait Details: initially walked with RW with min guard and requests to ambulate without AD and with slow, steady pace able to walk into and out of  bathroom without outside assist   Stairs Stairs: Yes Stairs assistance: Min guard Stair Management: Two rails, Step to pattern Number of Stairs: 1 (x3) General stair comments: min guard for use of RW as handrail to step up and back x 3, increased effort required but able to ascend/descend without outside assist       Balance Overall balance assessment: Needs assistance   Sitting balance-Leahy Scale: Good Sitting balance - Comments: static sitting without support   Standing balance support: Single extremity supported, Bilateral upper extremity supported Standing balance-Leahy Scale: Fair Standing balance comment: Performing grooming at sink                            Cognition Arousal/Alertness: Awake/alert Behavior During Therapy: WFL for tasks assessed/performed Overall Cognitive Status: Within Functional Limits for tasks assessed                                 General Comments: Very motivated           General Comments General comments (  skin integrity, edema, etc.): VSS on RA      Pertinent Vitals/Pain Pain Assessment Pain Assessment: No/denies pain     PT Goals (current goals can now be found in the care plan section) Acute Rehab PT Goals Patient Stated Goal: 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 PT Goal Formulation: With patient Time For Goal Achievement: 01/11/22 Potential to  Achieve Goals: Good    Frequency    Min 3X/week      PT Plan Discharge plan needs to be updated       AM-PAC PT "6 Clicks" Mobility   Outcome Measure  Help needed turning from your back to your side while in a flat bed without using bedrails?: None Help needed moving from lying on your back to sitting on the side of a flat bed without using bedrails?: None Help needed moving to and from a bed to a chair (including a wheelchair)?: None Help needed standing up from a chair using your arms (e.g., wheelchair or bedside chair)?: None Help needed to walk in hospital room?: A Little Help needed climbing 3-5 steps with a railing? : A Lot 6 Click Score: 21    End of Session Equipment Utilized During Treatment: Gait belt Activity Tolerance: Patient tolerated treatment well Patient left: with call bell/phone within reach;in bed Nurse Communication: Mobility status PT Visit Diagnosis: Other abnormalities of gait and mobility (R26.89);Difficulty in walking, not elsewhere classified (R26.2);Muscle weakness (generalized) (M62.81)     Time: 5400-8676 PT Time Calculation (min) (ACUTE ONLY): 18 min  Charges:  $Gait Training: 8-22 mins                     Ferrel Simington B. Migdalia Dk PT, DPT Acute Rehabilitation Services Please use secure chat or  Call Office [phone]    [place] 12/29/2021, 11:35 AM

## 2021-12-30 ENCOUNTER — Telehealth: Payer: Self-pay

## 2021-12-30 NOTE — Telephone Encounter (Signed)
Transition Care Management Follow-up Telephone Call Date of discharge and from where: [place] 12-29-21 Dx: E-coli- UTI How have you been since you were released from the hospital? Doing ok  Any questions or concerns? No  Items Reviewed: Did the pt receive and understand the discharge instructions provided? Yes  Medications obtained and verified? Yes  Other? No  Any new allergies since your discharge? No  Dietary orders reviewed? Yes Do you have support at home? Yes   Home Care and Equipment/Supplies: Were home health services ordered? yes If so, what is the name of the agency? Unsure   Has the agency set up a time to come to the patient's home? no Were any new equipment or medical supplies ordered?  Yes: Rolling walker  What is the name of the medical supply agency? Hospital  Were you able to get the supplies/equipment? yes Do you have any questions related to the use of the equipment or supplies? No  Functional Questionnaire: (I = Independent and D = Dependent) ADLs: I  Bathing/Dressing- I  Meal Prep- I  Eating- I  Maintaining continence- I  Transferring/Ambulation- I  Managing Meds- I  Follow up appointments reviewed:  PCP Hospital f/u appt confirmed? Yes  Scheduled to see Dr Einar Pheasant on 01-06-22 @ noon . [facility] f/u appt confirmed? No  . Are transportation arrangements needed? No  If their condition worsens, is the pt aware to call PCP or go to the Emergency Dept.? Yes Was the patient provided with contact information for the PCP's office or ED? Yes Was to pt encouraged to call back with questions or concerns? Yes

## 2022-01-06 ENCOUNTER — Encounter: Payer: Self-pay | Admitting: Family Medicine

## 2022-01-06 ENCOUNTER — Telehealth: Payer: Self-pay | Admitting: Radiology

## 2022-01-06 ENCOUNTER — Ambulatory Visit: Payer: Managed Care, Other (non HMO) | Admitting: Family Medicine

## 2022-01-06 VITALS — BP 122/80 | HR 73 | Temp 97.1°F | Ht 66.5 in | Wt 203.2 lb

## 2022-01-06 DIAGNOSIS — I700 Atherosclerosis of aorta: Secondary | ICD-10-CM | POA: Diagnosis not present

## 2022-01-06 DIAGNOSIS — E441 Mild protein-calorie malnutrition: Secondary | ICD-10-CM

## 2022-01-06 DIAGNOSIS — D72829 Elevated white blood cell count, unspecified: Secondary | ICD-10-CM | POA: Diagnosis not present

## 2022-01-06 DIAGNOSIS — R319 Hematuria, unspecified: Secondary | ICD-10-CM

## 2022-01-06 DIAGNOSIS — E785 Hyperlipidemia, unspecified: Secondary | ICD-10-CM

## 2022-01-06 DIAGNOSIS — J4520 Mild intermittent asthma, uncomplicated: Secondary | ICD-10-CM | POA: Diagnosis not present

## 2022-01-06 DIAGNOSIS — N390 Urinary tract infection, site not specified: Secondary | ICD-10-CM

## 2022-01-06 DIAGNOSIS — N200 Calculus of kidney: Secondary | ICD-10-CM

## 2022-01-06 DIAGNOSIS — R197 Diarrhea, unspecified: Secondary | ICD-10-CM

## 2022-01-06 DIAGNOSIS — N3001 Acute cystitis with hematuria: Secondary | ICD-10-CM | POA: Diagnosis not present

## 2022-01-06 HISTORY — DX: Mild protein-calorie malnutrition: E44.1

## 2022-01-06 LAB — COMPREHENSIVE METABOLIC PANEL
ALT: 15 U/L (ref 0–35)
AST: 20 U/L (ref 0–37)
Albumin: 4.2 g/dL (ref 3.5–5.2)
Alkaline Phosphatase: 89 U/L (ref 39–117)
BUN: 6 mg/dL (ref 6–23)
CO2: 27 mEq/L (ref 19–32)
Calcium: 9.9 mg/dL (ref 8.4–10.5)
Chloride: 98 mEq/L (ref 96–112)
Creatinine, Ser: 0.79 mg/dL (ref 0.40–1.20)
GFR: 82.52 mL/min (ref >60.00)
Glucose, Bld: 88 mg/dL (ref 70–99)
Potassium: 3.4 mEq/L — ABNORMAL LOW (ref 3.5–5.1)
Sodium: 136 mEq/L (ref 135–145)
Total Bilirubin: 0.7 mg/dL (ref 0.2–1.2)
Total Protein: 7.6 g/dL (ref 6.0–8.3)

## 2022-01-06 LAB — POC URINALSYSI DIPSTICK (AUTOMATED)
Bilirubin, UA: NEGATIVE
Blood, UA: POSITIVE
Glucose, UA: NEGATIVE
Ketones, UA: NEGATIVE
Nitrite, UA: NEGATIVE
Protein, UA: POSITIVE — AB
Spec Grav, UA: 1.015 (ref 1.010–1.025)
Urobilinogen, UA: 0.2 E.U./dL
pH, UA: 6.5 (ref 5.0–8.0)

## 2022-01-06 LAB — CBC WITH DIFFERENTIAL/PLATELET
Basophils Absolute: 0.1 10*3/uL (ref 0.0–0.1)
Basophils Relative: 0.5 % (ref 0.0–3.0)
Eosinophils Absolute: 0.5 10*3/uL (ref 0.0–0.7)
Eosinophils Relative: 2.8 % (ref 0.0–5.0)
HCT: 47.9 % — ABNORMAL HIGH (ref 36.0–46.0)
Hemoglobin: 16 g/dL — ABNORMAL HIGH (ref 12.0–15.0)
Lymphocytes Relative: 21.2 % (ref 12.0–46.0)
Lymphs Abs: 4.2 10*3/uL — ABNORMAL HIGH (ref 0.7–4.0)
MCHC: 33.5 g/dL (ref 30.0–36.0)
MCV: 86.4 fl (ref 78.0–100.0)
Monocytes Absolute: 0.9 10*3/uL (ref 0.1–1.0)
Monocytes Relative: 4.6 % (ref 3.0–12.0)
Neutro Abs: 13.9 10*3/uL — ABNORMAL HIGH (ref 1.4–7.7)
Neutrophils Relative %: 70.9 % (ref 43.0–77.0)
Platelets: 389 10*3/uL (ref 150.0–400.0)
RBC: 5.54 Mil/uL — ABNORMAL HIGH (ref 3.87–5.11)
RDW: 15.5 % (ref 11.5–15.5)
WBC: 19.6 10*3/uL (ref 4.0–10.5)

## 2022-01-06 MED ORDER — ATORVASTATIN CALCIUM 10 MG PO TABS: 10.0000 mg | ORAL_TABLET | Freq: Every day | ORAL | 3 refills | Status: DC

## 2022-01-06 MED ORDER — ALBUTEROL SULFATE HFA 108 (90 BASE) MCG/ACT IN AERS: 1.0000 | INHALATION_SPRAY | Freq: Four times a day (QID) | RESPIRATORY_TRACT | 0 refills | Status: DC | PRN

## 2022-01-06 NOTE — Assessment & Plan Note (Signed)
Cont atorvastatin and asa.

## 2022-01-06 NOTE — Telephone Encounter (Signed)
This is improved from prev values and can await PCP input.  Thanks.

## 2022-01-06 NOTE — Assessment & Plan Note (Signed)
Persistent dysuria and incontinence. Unclear if this is 2/2 the stone/stent or persistent infection. She has finished abx. Will recheck culture and consider extended course if culture is positive. If new fevers/worsening symptoms update.

## 2022-01-06 NOTE — Progress Notes (Signed)
Subjective:     NAOMA BOXELL is a 56 y.o. female presenting for Hospitalization Follow-up     HPI  #kidney stone - has a stent in place - has appointment for the stone removal  #dyrusia - feels shaky - still sweaty - no fever - feel like the stent is pulling - has terrible headaches daily - very tired - legs feel shaky when she gets up to walk  - burning - some times - frothy urine - persistent symptoms since leaving the hospital  Hydration - drinking 3-4 bottle of water  - is having some incontinence - some stool incontinence - diarrhea - 6-7 times a day since leaving the hospital - no change since stopping abx  Endorses some lightheadedness   Review of Systems  5/21-5/24/2023: Hospital - septic shock 2/2 to UTI - and ureteral stone - with cysto and stent placed. ABX. HH PT/OT recommended  Social History   Tobacco Use  Smoking Status Every Day   Packs/day: 0.50   Years: 30.00   Pack years: 15.00   Types: Cigarettes  Smokeless Tobacco Never        Objective:    BP Readings from Last 3 Encounters:  01/06/22 122/80  12/29/21 116/61  05/27/21 (!) 147/85   Wt Readings from Last 3 Encounters:  01/06/22 203 lb 4 oz (92.2 kg)  12/26/21 215 lb (97.5 kg)  09/17/20 223 lb 4 oz (101.3 kg)    BP 122/80   Pulse 73   Temp (!) 97.1 F (36.2 C) (Temporal)   Ht 5' 6.5" (1.689 m)   Wt 203 lb 4 oz (92.2 kg)   SpO2 98%   BMI 32.31 kg/m    Physical Exam Constitutional:      General: She is not in acute distress.    Appearance: She is well-developed. She is not diaphoretic.  HENT:     Head: Normocephalic and atraumatic.     Right Ear: Tympanic membrane and external ear normal.     Left Ear: Tympanic membrane and external ear normal.     Nose: Nose normal.  Eyes:     Conjunctiva/sclera: Conjunctivae normal.  Cardiovascular:     Rate and Rhythm: Normal rate and regular rhythm.     Heart sounds: No murmur heard. Pulmonary:     Effort: Pulmonary  effort is normal. No respiratory distress.     Breath sounds: Normal breath sounds. No wheezing.  Abdominal:     General: Abdomen is flat. Bowel sounds are normal. There is no distension.     Palpations: Abdomen is soft.     Tenderness: There is abdominal tenderness (generalized). There is no guarding or rebound.  Musculoskeletal:     Cervical back: Neck supple.  Skin:    General: Skin is warm and dry.     Capillary Refill: Capillary refill takes less than 2 seconds.  Neurological:     Mental Status: She is alert. Mental status is at baseline.  Psychiatric:        Mood and Affect: Mood normal.        Behavior: Behavior normal.          Assessment & Plan:   Problem List Items Addressed This Visit       Cardiovascular and Mediastinum   Aortic atherosclerosis ([place])    Cont atorvastatin and asa.        Relevant Medications   atorvastatin (LIPITOR) 10 MG tablet     Respiratory   Reactive airway  disease    Stable. Cont albuterol prn       Relevant Medications   albuterol (VENTOLIN HFA) 108 (90 Base) MCG/ACT inhaler     Digestive   Diarrhea of presumed infectious origin    Suspect either abx side effect vs infectious (concerning for c diff given recent hospitalization and abx use). Test for c diff and treat if positive. If negative - trial of imodium. Also discussed drinking 80 oz of water daily to compensate.        Relevant Orders   Comprehensive metabolic panel   C. difficile GDH and Toxin A/B     Genitourinary   Acute cystitis with hematuria - Primary    Persistent dysuria and incontinence. Unclear if this is 2/2 the stone/stent or persistent infection. She has finished abx. Will recheck culture and consider extended course if culture is positive. If new fevers/worsening symptoms update.        Relevant Orders   Urine Culture   Nephrolithiasis    C/b recent urosepsis with shock. Has urology f/u in 2 weeks for stone removal and stent exchange. Persistent  dysuria and some incontinence. Repeat UCx and f/u with urology as planned. Out of work on [employer] until 4 days after stone procedure (return on 01/24/2022         Other   Hyperlipidemia   Relevant Medications   atorvastatin (LIPITOR) 10 MG tablet   Mild protein-calorie malnutrition (HCC)    ~7% weight loss with this hospital stay and pt only consuming 400 calories daily. Poor appetite. Encouraged supplementing with ensure or alternative for any missed meal.        Other Visit Diagnoses     Urinary tract infection with hematuria, site unspecified       Relevant Orders   POCT Urinalysis Dipstick (Automated) (Completed)   Leukocytosis, unspecified type       Relevant Orders   CBC with Differential        Return in about 4 weeks (around 02/03/2022) for check in - if needed.  [name], MD

## 2022-01-06 NOTE — Patient Instructions (Addendum)
Diarrhea - we will test for C diff to rule this out - Drink at least 80 oz of water or hydration non-caffeine per day  Calories - try to increase food intake as tolerate - drink 2-3 nutritional drinks daily to help make up calories until you are feeling better  General recovery - expect 3-4 days per day in a hospital bed - so 12-16 days before you are feeling strength   Call if worsening fatigue or strength  If C diff is negative - ok to try imodium to help with bowels

## 2022-01-06 NOTE — Assessment & Plan Note (Signed)
~  7% weight loss with this hospital stay and pt only consuming 400 calories daily. Poor appetite. Encouraged supplementing with ensure or alternative for any missed meal.

## 2022-01-06 NOTE — Assessment & Plan Note (Addendum)
C/b recent urosepsis with shock. Has urology f/u in 2 weeks for stone removal and stent exchange. Persistent dysuria and some incontinence. Repeat UCx and f/u with urology as planned. Out of work on [employer] until 4 days after stone procedure (return on 01/24/2022

## 2022-01-06 NOTE — Telephone Encounter (Signed)
[facility] called a critical WBC, 19.6. Results given to Dr Damita Dunnings and sent to Dr Einar Pheasant

## 2022-01-06 NOTE — Assessment & Plan Note (Signed)
Suspect either abx side effect vs infectious (concerning for c diff given recent hospitalization and abx use). Test for c diff and treat if positive. If negative - trial of imodium. Also discussed drinking 80 oz of water daily to compensate.

## 2022-01-06 NOTE — Assessment & Plan Note (Signed)
Stable. Cont albuterol prn

## 2022-01-07 ENCOUNTER — Other Ambulatory Visit: Payer: Managed Care, Other (non HMO)

## 2022-01-07 DIAGNOSIS — Z029 Encounter for administrative examinations, unspecified: Secondary | ICD-10-CM

## 2022-01-07 DIAGNOSIS — R197 Diarrhea, unspecified: Secondary | ICD-10-CM

## 2022-01-07 LAB — URINE CULTURE
MICRO NUMBER:: 13470582
Result:: NO GROWTH
SPECIMEN QUALITY:: ADEQUATE

## 2022-01-07 NOTE — Telephone Encounter (Signed)
Improved from prior. Pt notified via mychart

## 2022-01-11 ENCOUNTER — Telehealth: Payer: Self-pay | Admitting: Family Medicine

## 2022-01-11 DIAGNOSIS — A498 Other bacterial infections of unspecified site: Secondary | ICD-10-CM

## 2022-01-11 LAB — C. DIFFICILE GDH AND TOXIN A/B
GDH ANTIGEN: DETECTED
MICRO NUMBER:: 13476577
SPECIMEN QUALITY:: ADEQUATE
TOXIN A AND B: NOT DETECTED

## 2022-01-11 LAB — CLOSTRIDIUM DIFFICILE TOXIN B, QUALITATIVE, REAL-TIME PCR: Toxigenic C. Difficile by PCR: DETECTED — AB

## 2022-01-11 MED ORDER — FIDAXOMICIN 200 MG PO TABS: 200.0000 mg | ORAL_TABLET | Freq: Two times a day (BID) | ORAL | 0 refills | Status: DC

## 2022-01-11 NOTE — Telephone Encounter (Signed)
C diff positive  Treat with medication. Update if no improvement.   ER if worsening or return  Advised that she try zinc oxide for skin irritation.

## 2022-01-12 ENCOUNTER — Other Ambulatory Visit: Payer: Self-pay | Admitting: Urology

## 2022-01-12 NOTE — Patient Instructions (Signed)
DUE TO COVID-19 ONLY TWO VISITORS  (aged 58 and older)  ARE ALLOWED TO COME WITH YOU AND STAY IN THE WAITING ROOM ONLY DURING PRE OP AND PROCEDURE.   **NO VISITORS ARE ALLOWED IN THE SHORT STAY AREA OR RECOVERY ROOM!!**  IF YOU WILL BE ADMITTED INTO THE HOSPITAL YOU ARE ALLOWED ONLY FOUR SUPPORT PEOPLE DURING VISITATION HOURS ONLY (7 AM -8PM)   The support person(s) must pass our screening, gel in and out, and wear a mask at all times, including in the patient's room. Patients must also wear a mask when staff or their support person are in the room. Visitors GUEST BADGE MUST BE WORN VISIBLY  One adult visitor may remain with you overnight and MUST be in the room by 8 P.M.     Your procedure is scheduled on: 01/19/22   Report to [facility] Main Entrance    Report to admitting at: 7:45 AM   Call this number if you have problems the morning of surgery [phone]   Do not eat food :After Midnight.   After Midnight you may have the following liquids until: 7:00 AM DAY OF SURGERY  Water Black Coffee (sugar ok, NO MILK/CREAM OR CREAMERS)  Tea (sugar ok, NO MILK/CREAM OR CREAMERS) regular and decaf                             Plain Jell-O (NO RED)                                           Fruit ices (not with fruit pulp, NO RED)                                     Popsicles (NO RED)                                                                  Juice: apple, WHITE grape, WHITE cranberry Sports drinks like Gatorade (NO RED) Clear broth(vegetable,chicken,beef)   Oral Hygiene is also important to reduce your risk of infection.                                    Remember - BRUSH YOUR TEETH THE MORNING OF SURGERY WITH YOUR REGULAR TOOTHPASTE   Do NOT smoke after Midnight   Take these medicines the morning of surgery with A SIP OF WATER: paroxetine,lithium.Tylenol as needed.  DO NOT TAKE ANY ORAL DIABETIC MEDICATIONS DAY OF YOUR SURGERY  Bring CPAP mask and tubing day of  surgery.                              You may not have any metal on your body including hair pins, jewelry, and body piercing             Do not wear make-up, lotions, powders, perfumes/cologne, or deodorant  Do not wear nail polish including  gel and S&S, artificial/acrylic nails, or any other type of covering on natural nails including finger and toenails. If you have artificial nails, gel coating, etc. that needs to be removed by a nail salon please have this removed prior to surgery or surgery may need to be canceled/ delayed if the surgeon/ anesthesia feels like they are unable to be safely monitored.   Do not shave  48 hours prior to surgery.    Do not bring valuables to the hospital. [place].   Contacts, dentures or bridgework may not be worn into surgery.   Bring small overnight bag day of surgery.   DO NOT [place]. PHARMACY WILL DISPENSE MEDICATIONS LISTED ON YOUR MEDICATION LIST TO YOU DURING YOUR ADMISSION [place]!    Patients discharged on the day of surgery will not be allowed to drive home.  Someone NEEDS to stay with you for the first 24 hours after anesthesia.   Special Instructions: Bring a copy of your healthcare power of attorney and living will documents         the day of surgery if you haven't scanned them before.              Please read over the following fact sheets you were given: IF YOU HAVE QUESTIONS ABOUT YOUR PRE-OP INSTRUCTIONS PLEASE CALL [phone]     [facility] - Preparing for Surgery Before surgery, you can play an important role.  Because skin is not sterile, your skin needs to be as free of germs as possible.  You can reduce the number of germs on your skin by washing with CHG (chlorahexidine gluconate) soap before surgery.  CHG is an antiseptic cleaner which kills germs and bonds with the skin to continue killing germs even after washing. Please DO NOT  use if you have an allergy to CHG or antibacterial soaps.  If your skin becomes reddened/irritated stop using the CHG and inform your nurse when you arrive at Short Stay. Do not shave (including legs and underarms) for at least 48 hours prior to the first CHG shower.  You may shave your face/neck. Please follow these instructions carefully:  1.  Shower with CHG Soap the night before surgery and the  morning of Surgery.  2.  If you choose to wash your hair, wash your hair first as usual with your  normal  shampoo.  3.  After you shampoo, rinse your hair and body thoroughly to remove the  shampoo.                           4.  Use CHG as you would any other liquid soap.  You can apply chg directly  to the skin and wash                       Gently with a scrungie or clean washcloth.  5.  Apply the CHG Soap to your body ONLY FROM THE NECK DOWN.   Do not use on face/ open                           Wound or open sores. Avoid contact with eyes, ears mouth and genitals (private parts).  Wash face,  Genitals (private parts) with your normal soap.             6.  Wash thoroughly, paying special attention to the area where your surgery  will be performed.  7.  Thoroughly rinse your body with warm water from the neck down.  8.  DO NOT shower/wash with your normal soap after using and rinsing off  the CHG Soap.                9.  Pat yourself dry with a clean towel.            10.  Wear clean pajamas.            11.  Place clean sheets on your bed the night of your first shower and do not  sleep with pets. Day of Surgery : Do not apply any lotions/deodorants the morning of surgery.  Please wear clean clothes to the hospital/surgery center.  FAILURE TO FOLLOW THESE INSTRUCTIONS MAY RESULT IN THE CANCELLATION OF YOUR SURGERY PATIENT SIGNATURE_________________________________  NURSE  SIGNATURE__________________________________  ________________________________________________________________________

## 2022-01-12 NOTE — Progress Notes (Signed)
Pt. Needs orders for upcoming procedure. 

## 2022-01-13 ENCOUNTER — Encounter (HOSPITAL_COMMUNITY): Payer: Self-pay

## 2022-01-13 ENCOUNTER — Other Ambulatory Visit: Payer: Self-pay

## 2022-01-13 ENCOUNTER — Encounter (HOSPITAL_COMMUNITY)
Admission: RE | Admit: 2022-01-13 | Discharge: 2022-01-13 | Disposition: A | Discharge status: Home / Self Care | Payer: Managed Care, Other (non HMO) | Source: Ambulatory Visit | Attending: Urology | Admitting: Urology

## 2022-01-13 VITALS — BP 137/80 | HR 69 | Temp 98.2°F | Ht 66.5 in | Wt 202.0 lb

## 2022-01-13 DIAGNOSIS — Z01812 Encounter for preprocedural laboratory examination: Secondary | ICD-10-CM | POA: Diagnosis present

## 2022-01-13 DIAGNOSIS — Z01818 Encounter for other preprocedural examination: Secondary | ICD-10-CM

## 2022-01-13 HISTORY — DX: Personal history of urinary calculi: Z87.442

## 2022-01-13 LAB — CBC
HCT: 43.7 % (ref 36.0–46.0)
Hemoglobin: 15.6 g/dL — ABNORMAL HIGH (ref 12.0–15.0)
MCH: 29.9 pg (ref 26.0–34.0)
MCHC: 35.7 g/dL (ref 30.0–36.0)
MCV: 83.9 fL (ref 80.0–100.0)
Platelets: 401 10*3/uL — ABNORMAL HIGH (ref 150–400)
RBC: 5.21 MIL/uL — ABNORMAL HIGH (ref 3.87–5.11)
RDW: 16.2 % — ABNORMAL HIGH (ref 11.5–15.5)
WBC: 14.9 10*3/uL — ABNORMAL HIGH (ref 4.0–10.5)
nRBC: 0 % (ref 0.0–0.2)

## 2022-01-13 NOTE — Progress Notes (Addendum)
For Short Stay: [place] appointment date: Date of COVID positive in last 42 days:  Bowel Prep reminder:   For Anesthesia: PCP - Dr. Waunita Schooner Cardiologist -   Chest x-ray - 12/26/21 EKG - 12/27/21 Stress Test -  ECHO -  Cardiac Cath -  Pacemaker/ICD device last checked: Pacemaker orders received: Device Rep notified:  Spinal Cord Stimulator:  Sleep Study -  CPAP -   Fasting Blood Sugar -  Checks Blood Sugar _____ times a day Date and result of last Hgb A1c-  Blood Thinner Instructions: Aspirin Instructions: Last Dose:  Activity level: Can go up a flight of stairs and activities of daily living without stopping and without chest pain and/or shortness of breath   Able to exercise without chest pain and/or shortness of breath   Unable to go up a flight of stairs without chest pain and/or shortness of breath     Anesthesia review: Hx: smoker  Patient denies shortness of breath, fever, cough and chest pain at PAT appointment   Patient verbalized understanding of instructions that were given to them at the PAT appointment. Patient was also instructed that they will need to review over the PAT instructions again at home before surgery.

## 2022-01-18 ENCOUNTER — Encounter (HOSPITAL_COMMUNITY): Payer: Self-pay | Admitting: Urology

## 2022-01-19 ENCOUNTER — Ambulatory Visit (HOSPITAL_COMMUNITY)
Admission: RE | Admit: 2022-01-19 | Discharge: 2022-01-19 | Disposition: A | Discharge status: Home / Self Care | Payer: Managed Care, Other (non HMO) | Attending: Urology | Admitting: Urology

## 2022-01-19 ENCOUNTER — Ambulatory Visit (HOSPITAL_COMMUNITY): Payer: Managed Care, Other (non HMO)

## 2022-01-19 ENCOUNTER — Ambulatory Visit (HOSPITAL_COMMUNITY): Payer: Managed Care, Other (non HMO) | Admitting: Anesthesiology

## 2022-01-19 ENCOUNTER — Encounter (HOSPITAL_COMMUNITY): Payer: Self-pay | Admitting: Urology

## 2022-01-19 ENCOUNTER — Encounter (HOSPITAL_COMMUNITY)
Admission: RE | Disposition: A | Discharge status: Home / Self Care | Payer: Self-pay | Source: Home / Self Care | Attending: Urology

## 2022-01-19 ENCOUNTER — Ambulatory Visit (HOSPITAL_BASED_OUTPATIENT_CLINIC_OR_DEPARTMENT_OTHER): Payer: Managed Care, Other (non HMO) | Admitting: Anesthesiology

## 2022-01-19 DIAGNOSIS — F17210 Nicotine dependence, cigarettes, uncomplicated: Secondary | ICD-10-CM | POA: Insufficient documentation

## 2022-01-19 DIAGNOSIS — N200 Calculus of kidney: Secondary | ICD-10-CM

## 2022-01-19 DIAGNOSIS — Z87440 Personal history of urinary (tract) infections: Secondary | ICD-10-CM | POA: Diagnosis not present

## 2022-01-19 DIAGNOSIS — N201 Calculus of ureter: Secondary | ICD-10-CM | POA: Diagnosis present

## 2022-01-19 DIAGNOSIS — Z87442 Personal history of urinary calculi: Secondary | ICD-10-CM | POA: Insufficient documentation

## 2022-01-19 DIAGNOSIS — F419 Anxiety disorder, unspecified: Secondary | ICD-10-CM | POA: Diagnosis not present

## 2022-01-19 DIAGNOSIS — F32A Depression, unspecified: Secondary | ICD-10-CM | POA: Insufficient documentation

## 2022-01-19 DIAGNOSIS — F418 Other specified anxiety disorders: Secondary | ICD-10-CM | POA: Diagnosis not present

## 2022-01-19 HISTORY — PX: CYSTOSCOPY/URETEROSCOPY/HOLMIUM LASER/STENT PLACEMENT: SHX6546

## 2022-01-19 SURGERY — CYSTOSCOPY/URETEROSCOPY/HOLMIUM LASER/STENT PLACEMENT
Anesthesia: General | Laterality: Left

## 2022-01-19 MED ORDER — GENTAMICIN SULFATE 40 MG/ML IJ SOLN
360.0000 mg | INTRAVENOUS | Status: AC
  Administered 2022-01-19: 360 mg via INTRAVENOUS
  Filled 2022-01-19: qty 9

## 2022-01-19 MED ORDER — TRAMADOL HCL 50 MG PO TABS: 50.0000 mg | ORAL_TABLET | Freq: Four times a day (QID) | ORAL | 0 refills | Status: DC | PRN

## 2022-01-19 MED ORDER — MIDAZOLAM HCL 2 MG/2ML IJ SOLN
2.0000 mg | Freq: Once | INTRAMUSCULAR | Status: AC | PRN
Start: 2022-01-19 — End: 2022-01-19
  Administered 2022-01-19: 2 mg via INTRAVENOUS
  Filled 2022-01-19: qty 2

## 2022-01-19 MED ORDER — SODIUM CHLORIDE 0.9 % IR SOLN
Status: DC | PRN
  Administered 2022-01-19: 3000 mL via INTRAVESICAL

## 2022-01-19 MED ORDER — PROPOFOL 10 MG/ML IV BOLUS
INTRAVENOUS | Status: DC | PRN
  Administered 2022-01-19: 200 mg via INTRAVENOUS

## 2022-01-19 MED ORDER — MIDAZOLAM HCL 5 MG/5ML IJ SOLN
INTRAMUSCULAR | Status: DC | PRN
  Administered 2022-01-19: 1 mg via INTRAVENOUS

## 2022-01-19 MED ORDER — PROPOFOL 10 MG/ML IV BOLUS
INTRAVENOUS | Status: AC
  Filled 2022-01-19: qty 20

## 2022-01-19 MED ORDER — FENTANYL CITRATE (PF) 100 MCG/2ML IJ SOLN
INTRAMUSCULAR | Status: AC
  Filled 2022-01-19: qty 2

## 2022-01-19 MED ORDER — PHENAZOPYRIDINE HCL 200 MG PO TABS: 200.0000 mg | ORAL_TABLET | Freq: Three times a day (TID) | ORAL | 0 refills | Status: DC | PRN

## 2022-01-19 MED ORDER — FENTANYL CITRATE PF 50 MCG/ML IJ SOSY: 25.0000 ug | PREFILLED_SYRINGE | INTRAMUSCULAR | Status: DC | PRN

## 2022-01-19 MED ORDER — CELECOXIB 200 MG PO CAPS
200.0000 mg | ORAL_CAPSULE | Freq: Once | ORAL | Status: AC
  Administered 2022-01-19: 200 mg via ORAL
  Filled 2022-01-19: qty 1

## 2022-01-19 MED ORDER — MEPERIDINE HCL 50 MG/ML IJ SOLN: 6.2500 mg | INTRAMUSCULAR | Status: DC | PRN

## 2022-01-19 MED ORDER — OXYCODONE HCL 5 MG/5ML PO SOLN: 5.0000 mg | Freq: Once | ORAL | Status: DC | PRN

## 2022-01-19 MED ORDER — ACETAMINOPHEN 325 MG PO TABS: 325.0000 mg | ORAL_TABLET | ORAL | Status: DC | PRN

## 2022-01-19 MED ORDER — DEXAMETHASONE SODIUM PHOSPHATE 4 MG/ML IJ SOLN
INTRAMUSCULAR | Status: DC | PRN
  Administered 2022-01-19: 4 mg via INTRAVENOUS

## 2022-01-19 MED ORDER — FENTANYL CITRATE (PF) 100 MCG/2ML IJ SOLN
INTRAMUSCULAR | Status: DC | PRN
Start: 2022-01-19 — End: 2022-01-19
  Administered 2022-01-19: 25 ug via INTRAVENOUS
  Administered 2022-01-19: 50 ug via INTRAVENOUS
  Administered 2022-01-19: 25 ug via INTRAVENOUS

## 2022-01-19 MED ORDER — ACETAMINOPHEN 160 MG/5ML PO SOLN: 325.0000 mg | ORAL | Status: DC | PRN

## 2022-01-19 MED ORDER — OXYCODONE HCL 5 MG PO TABS: 5.0000 mg | ORAL_TABLET | Freq: Once | ORAL | Status: DC | PRN

## 2022-01-19 MED ORDER — ONDANSETRON HCL 4 MG/2ML IJ SOLN: 4.0000 mg | Freq: Once | INTRAMUSCULAR | Status: DC | PRN

## 2022-01-19 MED ORDER — MIDAZOLAM HCL 2 MG/2ML IJ SOLN
INTRAMUSCULAR | Status: AC
  Filled 2022-01-19: qty 2

## 2022-01-19 MED ORDER — CHLORHEXIDINE GLUCONATE 0.12 % MT SOLN
15.0000 mL | Freq: Once | OROMUCOSAL | Status: AC
Start: 2022-01-19 — End: 2022-01-19
  Administered 2022-01-19: 15 mL via OROMUCOSAL

## 2022-01-19 MED ORDER — LIDOCAINE HCL (CARDIAC) PF 100 MG/5ML IV SOSY
PREFILLED_SYRINGE | INTRAVENOUS | Status: DC | PRN
  Administered 2022-01-19: 100 mg via INTRAVENOUS

## 2022-01-19 MED ORDER — CIPROFLOXACIN HCL 500 MG PO TABS: 500.0000 mg | ORAL_TABLET | Freq: Once | ORAL | 0 refills | Status: AC

## 2022-01-19 MED ORDER — ORAL CARE MOUTH RINSE: 15.0000 mL | Freq: Once | OROMUCOSAL | Status: AC

## 2022-01-19 MED ORDER — LACTATED RINGERS IV SOLN: INTRAVENOUS | Status: DC

## 2022-01-19 MED ORDER — ONDANSETRON HCL 4 MG/2ML IJ SOLN
INTRAMUSCULAR | Status: DC | PRN
  Administered 2022-01-19: 4 mg via INTRAVENOUS

## 2022-01-19 MED ORDER — ACETAMINOPHEN 500 MG PO TABS
1000.0000 mg | ORAL_TABLET | Freq: Once | ORAL | Status: AC
  Administered 2022-01-19: 1000 mg via ORAL
  Filled 2022-01-19: qty 2

## 2022-01-19 SURGICAL SUPPLY — 21 items
BAG URO CATCHER STRL LF (MISCELLANEOUS) ×2 IMPLANT
BASKET ZERO TIP NITINOL 2.4FR (BASKET) IMPLANT
BSKT STON RTRVL ZERO TP 2.4FR (BASKET)
CATH URETL OPEN 5X70 (CATHETERS) ×2 IMPLANT
CLOTH BEACON ORANGE TIMEOUT ST (SAFETY) ×2 IMPLANT
EXTRACTOR STONE 1.7FRX115CM (UROLOGICAL SUPPLIES) ×1 IMPLANT
GLOVE SURG LX 7.5 STRW (GLOVE) ×5
GLOVE SURG LX STRL 7.5 STRW (GLOVE) ×1 IMPLANT
GOWN STRL REUS W/ TWL LRG LVL3 (GOWN DISPOSABLE) ×2 IMPLANT
GOWN STRL REUS W/ TWL XL LVL3 (GOWN DISPOSABLE) ×1 IMPLANT
GOWN STRL REUS W/TWL LRG LVL3 (GOWN DISPOSABLE) ×4
GOWN STRL REUS W/TWL XL LVL3 (GOWN DISPOSABLE) ×2
GUIDEWIRE ANG ZIPWIRE 038X150 (WIRE) IMPLANT
GUIDEWIRE STR DUAL SENSOR (WIRE) ×2 IMPLANT
MANIFOLD NEPTUNE II (INSTRUMENTS) ×2 IMPLANT
PACK CYSTO (CUSTOM PROCEDURE TRAY) ×2 IMPLANT
SHEATH NAVIGATOR HD 11/13X28 (SHEATH) IMPLANT
SHEATH NAVIGATOR HD 11/13X36 (SHEATH) IMPLANT
STENT URET 6FRX24 CONTOUR (STENTS) ×1 IMPLANT
TUBING CONNECTING 10 (TUBING) ×2 IMPLANT
TUBING UROLOGY SET (TUBING) ×2 IMPLANT

## 2022-01-19 NOTE — Op Note (Signed)
Preoperative diagnosis: left ureteral calculus  Postoperative diagnosis: left ureteral calculus  Procedure:  Cystoscopy left ureteroscopy and basket stone removal left 72F x 24 ureteral stent exchange  left retrograde pyelography with interpretation  Surgeon: Ardis Hughs, MD  Anesthesia: General  Complications: None  Intraoperative findings:  #1: The patient's stone had been pushed up into the lower pole of the kidney.  I was able to remove the stone without laser fragmentation.  There was 1 other larger stone in the upper pole that I was able to remove as well. #2: 24 cm time 6 French double-J ureteral stent was placed at the end of the case. #3: The patient's left retrograde pyelogram demonstrated normal caliber ureter no filling defects or significant abnormalities.  The stone in the lower pole was radiopaque and easily visualized with fluoroscopic evaluation.  There were no other abnormalities.  EBL: Minimal  Specimens: left ureteral calculus  Disposition of specimens: [facility] for stone analysis  Indication: Jennifer [name] is a 56 y.o.   patient with a left ureteral stone and associated left symptoms.  Several weeks prior she had presented to the emergency department septic with an E. coli bacteremia.  A stent was placed and she was treated with a long course of antibiotics.  After reviewing the management options for treatment, the patient elected to proceed with the above surgical procedure(s). We have discussed the potential benefits and risks of the procedure, side effects of the proposed treatment, the likelihood of the patient achieving the goals of the procedure, and any potential problems that might occur during the procedure or recuperation. Informed consent has been obtained.   Description of procedure:  The patient was taken to the operating room and general anesthesia was induced.  The patient was placed in the dorsal lithotomy  position, prepped and draped in the usual sterile fashion, and preoperative antibiotics were administered. A preoperative time-out was performed.   Cystourethroscopy was performed.  The patient's urethra was examined and was normal.  The bladder was then systematically examined in its entirety. There was no evidence for any bladder tumors, stones, or other mucosal pathology.    The stent emanating from the patient's left ureteral orifice was then brought to the urethral meatus.  I then passed a wire up through the stent and up into the left renal pelvis.  I then exchanged the wire for an open-ended ureteral catheter.  I then performed a retrograde pyelogram with the above findings.  I then repassed the wire.  I subsequently advanced a semirigid ureteroscope through the patient's urethra and the bladder easily cannulating the left ureteral orifice and advanced up to the renal pelvis.  There were no stones within the ureter.  Subsequently remove the semirigid ureteroscope and exchanged for the dual-lumen flexible ureteroscope.  I was able to easily pass this up into the patient's left kidney under visual guidance.  I removed a small nonobstructing stone from the lower pole with the engage basket.  I then was able to get back into the kidney and remove a larger stone in the upper pole without laser fragmentation.  This was removed entirely.  I then repassed the scope up into the patient's kidney and found the stone in the lower pole that I had pushed up at the time of stent placement.  The stone was significantly larger, but I was able to grasp it in a long way fashion and remove it through the prestented ureter without laser fragmentation.  I  reinspected the collecting system with fluoroscopic guidance noting no additional stones.  I then gently backed out the ureteroscope noting no significant ureteral trauma.  The stent was then advanced over the wire and into the upper pole of the left kidney under  fluoroscopic guidance.  Once the stent was noted to be well within the kidney advanced to the urethral meatus before removing the wire entirely.  The stent then popped into the patient's bladder and a nice curl was confirmed with fluoroscopy.  Stent tether was then brought through the patient's urethra and tucked into the patient's vagina.  The bladder was then emptied and the procedure ended.  The patient appeared to tolerate the procedure well and without complications.  The patient was able to be awakened and transferred to the recovery unit in satisfactory condition.   Disposition: The tether of the stent was left on and tucked inside the patient's vagina.  Instructions for removing the stent have been provided to the patient. The patient has been scheduled for followup in 6 weeks with a renal ultrasound.

## 2022-01-19 NOTE — Anesthesia Postprocedure Evaluation (Signed)
Anesthesia Post Note  Patient: Jennifer [name]  Procedure(s) Performed: LEFT URETEROSCOPY/STENT EXCHANGE/BASKET STONE REMOVAL (Left)     Patient location during evaluation: PACU Anesthesia Type: General Level of consciousness: awake and alert Pain management: pain level controlled Vital Signs Assessment: post-procedure vital signs reviewed and stable Respiratory status: spontaneous breathing, nonlabored ventilation, respiratory function stable and patient connected to nasal cannula oxygen Cardiovascular status: blood pressure returned to baseline and stable Postop Assessment: no apparent nausea or vomiting Anesthetic complications: no   No notable events documented.  Last Vitals:  Vitals:   01/19/22 1030 01/19/22 1149  BP: 133/76 131/78  Pulse: 60 68  Resp: 17 13  Temp:  36.4 C  SpO2: 98% 100%    Last Pain:  Vitals:   01/19/22 1149  TempSrc:   PainSc: Asleep                 Jolleen Seman

## 2022-01-19 NOTE — Discharge Instructions (Signed)
DISCHARGE INSTRUCTIONS FOR KIDNEY STONE/URETERAL STENT   MEDICATIONS:  1. Resume all your other meds from home - except do not take any extra narcotic pain meds that you may have at home.  2. Pyridium is to help with the burning/stinging when you urinate. 3. Tramadol is for moderate/severe pain, otherwise taking upto 1000 mg every 6 hours of plainTylenol will help treat your pain.   4. Take Cipro one hour prior to removal of your stent.   ACTIVITY:  1. No strenuous activity x 1week  2. No driving while on narcotic pain medications  3. Drink plenty of water  4. Continue to walk at home - you can still get blood clots when you are at home, so keep active, but don't over do it.  5. May return to work/school tomorrow or when you feel ready   BATHING:  1. You can shower and we recommend daily showers  2. You have a string coming from your urethra: The stent string is attached to your ureteral stent. Do not pull on this.   SIGNS/SYMPTOMS TO CALL:  Please call us if you have a fever greater than 101.5, uncontrolled nausea/vomiting, uncontrolled pain, dizziness, unable to urinate, bloody urine, chest pain, shortness of breath, leg swelling, leg pain, redness around wound, drainage from wound, or any other concerns or questions.   You can reach [place] at [phone].   FOLLOW-UP:  1. You have an appointment in 6 weeks with a ultrasound of your kidneys prior.   2. You have a string attached to your stent, you may remove it on Monday, June 19th. To do this, pull the strings until the stents are completely removed. You may feel an odd sensation in your back.

## 2022-01-19 NOTE — Anesthesia Procedure Notes (Signed)
Procedure Name: LMA Insertion Date/Time: 01/19/2022 11:01 AM  Performed by: Lavina Hamman, CRNAPre-anesthesia Checklist: Patient identified, Emergency Drugs available, Suction available and Patient being monitored Patient Re-evaluated:Patient Re-evaluated prior to induction Oxygen Delivery Method: Circle System Utilized Preoxygenation: Pre-oxygenation with 100% oxygen Induction Type: IV induction Ventilation: Mask ventilation without difficulty LMA: LMA inserted LMA Size: 4.0 Number of attempts: 1 Airway Equipment and Method: Bite block Placement Confirmation: positive ETCO2 Tube secured with: Tape Dental Injury: Teeth and Oropharynx as per pre-operative assessment

## 2022-01-19 NOTE — Interval H&P Note (Signed)
History and Physical Interval Note: Patient was treated for her infection and now is here for stone removal.  She has had no complications from the stent.  She is feeling well. 01/19/2022 10:28 AM  Jennifer [name]  has presented today for surgery, with the diagnosis of LEFT URETERAL STONE.  The various methods of treatment have been discussed with the patient and family. After consideration of risks, benefits and other options for treatment, the patient has consented to  Procedure(s): LEFT URETEROSCOPY/HOLMIUM LASER/STENT EXCHANGE (Left) as a surgical intervention.  The patient's history has been reviewed, patient examined, no change in status, stable for surgery.  I have reviewed the patient's chart and labs.  Questions were answered to the patient's satisfaction.     Ardis Hughs

## 2022-01-19 NOTE — Anesthesia Preprocedure Evaluation (Addendum)
Anesthesia Evaluation  Patient identified by MRN, date of birth, ID band Patient awake    Reviewed: Allergy & Precautions, H&P , NPO status , Patient's Chart, lab work & pertinent test results  Airway Mallampati: III  TM Distance: >3 FB Neck ROM: Full    Dental no notable dental hx. (+) Teeth Intact, Dental Advisory Given, Missing, Caps, Poor Dentition,    Pulmonary Current Smoker,    Pulmonary exam normal breath sounds clear to auscultation       Cardiovascular negative cardio ROS   Rhythm:Regular Rate:Normal     Neuro/Psych PSYCHIATRIC DISORDERS Anxiety Depression negative neurological ROS     GI/Hepatic negative GI ROS, Neg liver ROS,   Endo/Other  negative endocrine ROS  Renal/GU negative Renal ROS  negative genitourinary   Musculoskeletal   Abdominal   Peds  Hematology negative hematology ROS (+)   Anesthesia Other Findings   Reproductive/Obstetrics negative OB ROS                            Anesthesia Physical  Anesthesia Plan  ASA: 3  Anesthesia Plan: General   Post-op Pain Management: Tylenol PO (pre-op)* and Celebrex PO (pre-op)*   Induction: Intravenous  PONV Risk Score and Plan: 3 and Ondansetron, Dexamethasone and Midazolam  Airway Management Planned: Oral ETT and LMA  Additional Equipment: None  Intra-op Plan:   Post-operative Plan: Extubation in OR  Informed Consent: I have reviewed the patients History and Physical, chart, labs and discussed the procedure including the risks, benefits and alternatives for the proposed anesthesia with the patient or authorized representative who has indicated his/her understanding and acceptance.   Patient has DNR.  Discussed DNR with patient and Continue DNR.   Dental advisory given  Plan Discussed with: CRNA and Anesthesiologist  Anesthesia Plan Comments:         Anesthesia Quick Evaluation

## 2022-01-19 NOTE — Transfer of Care (Signed)
Immediate Anesthesia Transfer of Care Note  Patient: Jennifer [name]  Procedure(s) Performed: Procedure(s): LEFT URETEROSCOPY/STENT EXCHANGE/BASKET STONE REMOVAL (Left)  Patient Location: PACU  Anesthesia Type:General  Level of Consciousness:  sedated, patient cooperative and responds to stimulation  Airway & Oxygen Therapy:Patient Spontanous Breathing and Patient connected to face mask oxgen  Post-op Assessment:  Report given to PACU RN and Post -op Vital signs reviewed and stable  Post vital signs:  Reviewed and stable  Last Vitals:  Vitals:   01/19/22 0853 01/19/22 1030  BP: (!) 165/79 133/76  Pulse: 68 60  Resp: 18 17  Temp: 36.7 C   SpO2: 81% 38%    Complications: No apparent anesthesia complications

## 2022-01-20 ENCOUNTER — Encounter (HOSPITAL_COMMUNITY): Payer: Self-pay | Admitting: Urology

## 2022-01-20 LAB — SURGICAL PATHOLOGY

## 2022-01-24 ENCOUNTER — Emergency Department (HOSPITAL_COMMUNITY): Payer: Managed Care, Other (non HMO)

## 2022-01-24 ENCOUNTER — Emergency Department (HOSPITAL_COMMUNITY)
Admission: EM | Admit: 2022-01-24 | Discharge: 2022-01-24 | Disposition: A | Discharge status: Home / Self Care | Payer: Managed Care, Other (non HMO) | Attending: Emergency Medicine | Admitting: Emergency Medicine

## 2022-01-24 ENCOUNTER — Encounter (HOSPITAL_COMMUNITY): Payer: Self-pay

## 2022-01-24 ENCOUNTER — Other Ambulatory Visit: Payer: Self-pay

## 2022-01-24 DIAGNOSIS — D72829 Elevated white blood cell count, unspecified: Secondary | ICD-10-CM | POA: Diagnosis not present

## 2022-01-24 DIAGNOSIS — R197 Diarrhea, unspecified: Secondary | ICD-10-CM | POA: Insufficient documentation

## 2022-01-24 DIAGNOSIS — Z7982 Long term (current) use of aspirin: Secondary | ICD-10-CM | POA: Insufficient documentation

## 2022-01-24 DIAGNOSIS — R112 Nausea with vomiting, unspecified: Secondary | ICD-10-CM | POA: Diagnosis not present

## 2022-01-24 DIAGNOSIS — R1030 Lower abdominal pain, unspecified: Secondary | ICD-10-CM | POA: Diagnosis present

## 2022-01-24 DIAGNOSIS — R509 Fever, unspecified: Secondary | ICD-10-CM | POA: Insufficient documentation

## 2022-01-24 DIAGNOSIS — E876 Hypokalemia: Secondary | ICD-10-CM | POA: Insufficient documentation

## 2022-01-24 LAB — CBG MONITORING, ED: Glucose-Capillary: 131 mg/dL — ABNORMAL HIGH (ref 70–99)

## 2022-01-24 LAB — CBC WITH DIFFERENTIAL/PLATELET
Abs Immature Granulocytes: 0.28 10*3/uL — ABNORMAL HIGH (ref 0.00–0.07)
Basophils Absolute: 0.1 10*3/uL (ref 0.0–0.1)
Basophils Relative: 1 %
Eosinophils Absolute: 0.6 10*3/uL — ABNORMAL HIGH (ref 0.0–0.5)
Eosinophils Relative: 3 %
HCT: 44.8 % (ref 36.0–46.0)
Hemoglobin: 16 g/dL — ABNORMAL HIGH (ref 12.0–15.0)
Immature Granulocytes: 1 %
Lymphocytes Relative: 18 %
Lymphs Abs: 4.2 10*3/uL — ABNORMAL HIGH (ref 0.7–4.0)
MCH: 29.6 pg (ref 26.0–34.0)
MCHC: 35.7 g/dL (ref 30.0–36.0)
MCV: 83 fL (ref 80.0–100.0)
Monocytes Absolute: 1.4 10*3/uL — ABNORMAL HIGH (ref 0.1–1.0)
Monocytes Relative: 6 %
Neutro Abs: 16.7 10*3/uL — ABNORMAL HIGH (ref 1.7–7.7)
Neutrophils Relative %: 71 %
Platelets: 374 10*3/uL (ref 150–400)
RBC: 5.4 MIL/uL — ABNORMAL HIGH (ref 3.87–5.11)
RDW: 16.7 % — ABNORMAL HIGH (ref 11.5–15.5)
WBC: 23.4 10*3/uL — ABNORMAL HIGH (ref 4.0–10.5)
nRBC: 0 % (ref 0.0–0.2)

## 2022-01-24 LAB — COMPREHENSIVE METABOLIC PANEL
ALT: 16 U/L (ref 0–44)
AST: 21 U/L (ref 15–41)
Albumin: 3.9 g/dL (ref 3.5–5.0)
Alkaline Phosphatase: 91 U/L (ref 38–126)
Anion gap: 12 (ref 5–15)
BUN: 6 mg/dL (ref 6–20)
CO2: 29 mmol/L (ref 22–32)
Calcium: 9.6 mg/dL (ref 8.9–10.3)
Chloride: 102 mmol/L (ref 98–111)
Creatinine, Ser: 0.91 mg/dL (ref 0.44–1.00)
GFR, Estimated: >60 mL/min (ref >60)
Glucose, Bld: 131 mg/dL — ABNORMAL HIGH (ref 70–99)
Potassium: 2.8 mmol/L — ABNORMAL LOW (ref 3.5–5.1)
Sodium: 143 mmol/L (ref 135–145)
Total Bilirubin: 0.7 mg/dL (ref 0.3–1.2)
Total Protein: 7.5 g/dL (ref 6.5–8.1)

## 2022-01-24 LAB — URINALYSIS, ROUTINE W REFLEX MICROSCOPIC
Bacteria, UA: NONE SEEN
Bilirubin Urine: NEGATIVE
Glucose, UA: NEGATIVE mg/dL
Hgb urine dipstick: NEGATIVE
Ketones, ur: NEGATIVE mg/dL
Nitrite: NEGATIVE
Protein, ur: NEGATIVE mg/dL
Specific Gravity, Urine: 1.003 — ABNORMAL LOW (ref 1.005–1.030)
pH: 8 (ref 5.0–8.0)

## 2022-01-24 LAB — LACTIC ACID, PLASMA: Lactic Acid, Venous: 3.7 mmol/L (ref 0.5–1.9)

## 2022-01-24 LAB — PROTIME-INR
INR: 1 (ref 0.8–1.2)
Prothrombin Time: 13.3 seconds (ref 11.4–15.2)

## 2022-01-24 LAB — APTT: aPTT: 26 seconds (ref 24–36)

## 2022-01-24 MED ORDER — LACTATED RINGERS IV BOLUS (SEPSIS)
1000.0000 mL | Freq: Once | INTRAVENOUS | Status: AC
  Administered 2022-01-24: 1000 mL via INTRAVENOUS

## 2022-01-24 MED ORDER — PROMETHAZINE HCL 25 MG PO TABS: 25.0000 mg | ORAL_TABLET | Freq: Four times a day (QID) | ORAL | 0 refills | Status: AC | PRN

## 2022-01-24 MED ORDER — POTASSIUM CHLORIDE 20 MEQ PO PACK: 40.0000 meq | PACK | Freq: Every day | ORAL | Status: DC

## 2022-01-24 MED ORDER — DIPHENHYDRAMINE HCL 50 MG/ML IJ SOLN
25.0000 mg | Freq: Once | INTRAMUSCULAR | Status: AC
  Administered 2022-01-24: 25 mg via INTRAVENOUS
  Filled 2022-01-24: qty 1

## 2022-01-24 MED ORDER — MAGNESIUM OXIDE -MG SUPPLEMENT 400 (240 MG) MG PO TABS: 800.0000 mg | ORAL_TABLET | Freq: Once | ORAL | Status: DC

## 2022-01-24 MED ORDER — ONDANSETRON 4 MG PO TBDP: ORAL_TABLET | ORAL | 0 refills | Status: DC

## 2022-01-24 MED ORDER — HYDROMORPHONE HCL 1 MG/ML IJ SOLN
0.5000 mg | Freq: Once | INTRAMUSCULAR | Status: AC
  Administered 2022-01-24: 0.5 mg via INTRAVENOUS
  Filled 2022-01-24: qty 1

## 2022-01-24 MED ORDER — PROCHLORPERAZINE EDISYLATE 10 MG/2ML IJ SOLN
10.0000 mg | Freq: Once | INTRAMUSCULAR | Status: AC
  Administered 2022-01-24: 10 mg via INTRAVENOUS
  Filled 2022-01-24: qty 2

## 2022-01-24 NOTE — ED Provider Notes (Signed)
[facility] DEPT Provider Note   CSN: [id number] Arrival date & time: 01/24/22  0850     History  Chief Complaint  Patient presents with   Nausea   Weakness   Diarrhea   Abdominal Pain    Jennifer [name] is a 56 y.o. female.  56 yo F with a chief complaint of nausea vomiting subjective fevers and chills going on for a couple days now.  The patient had a ureteral stent placed on the left after having some kidney stones removed about 48 hours ago.  Since then she has not felt very well.  She took her stent out last night as per instructed.  She denies any specific urinary symptoms.  Complaining mostly of lower abdominal discomfort.  She is also recently been sick and was hospitalized later ended up having C. difficile.  She still having some loose stools.  Has not been able to keep anything down.  Nausea and vomiting.   Weakness Associated symptoms: abdominal pain and diarrhea   Diarrhea Associated symptoms: abdominal pain   Abdominal Pain Associated symptoms: diarrhea        Home Medications Prior to Admission medications   Medication Sig Start Date End Date Taking? Authorizing Provider  ondansetron (ZOFRAN-ODT) 4 MG disintegrating tablet '4mg'$  ODT q4 hours prn nausea/vomit 01/24/22  Yes Deno Etienne, DO  promethazine (PHENERGAN) 25 MG tablet Take 1 tablet (25 mg total) by mouth every 6 (six) hours as needed for nausea or vomiting. 01/24/22  Yes Deno Etienne, DO  acetaminophen (TYLENOL) 325 MG tablet Take 650 mg by mouth every 6 (six) hours as needed for moderate pain.    [provider]  albuterol (VENTOLIN HFA) 108 (90 Base) MCG/ACT inhaler Inhale 1-2 puffs into the lungs every 6 (six) hours as needed for wheezing or shortness of breath. 01/06/22   [name], MD  ASPIRIN LOW DOSE 81 MG EC tablet TAKE ONE TABLET BY MOUTH DAILY 12/09/20   [name], MD  atorvastatin (LIPITOR) 10 MG tablet Take 1 tablet (10 mg total) by mouth daily.  01/06/22   [name], MD  clindamycin (CLINDAGEL) 1 % gel Apply 1 application. topically daily as needed (For lips).    [provider]  fidaxomicin (DIFICID) 200 MG TABS tablet Take 1 tablet (200 mg total) by mouth 2 (two) times daily. 01/11/22   [name], MD  ipratropium (ATROVENT) 0.06 % nasal spray Place 1 spray into both nostrils daily as needed for rhinitis.    [provider]  lithium carbonate (ESKALITH) 450 MG CR tablet Take 1 tablet (450 mg total) by mouth 2 (two) times daily. 11/25/21   [name], MD  oxybutynin (DITROPAN-XL) 5 MG 24 hr tablet Take 5 mg by mouth at bedtime.    [provider]  PARoxetine (PAXIL) 40 MG tablet Take 1.5 tablets (60 mg total) by mouth every morning. 12/16/21   [name], MD  phenazopyridine (PYRIDIUM) 200 MG tablet Take 1 tablet (200 mg total) by mouth 3 (three) times daily as needed for pain. 01/19/22   Ardis Hughs, MD  sodium fluoride (FLUORISHIELD) 1.1 % GEL dental gel Place 1 application onto teeth every other day.  Patient not taking: Reported on 01/12/2022    [provider]  traMADol (ULTRAM) 50 MG tablet Take 1-2 tablets (50-100 mg total) by mouth every 6 (six) hours as needed for moderate pain. 01/19/22   Ardis Hughs, MD  valACYclovir ([place]) 1000  MG tablet Take 1,000 mg by mouth daily as needed (for fever blisters). 08/24/17   [provider]      Allergies    Ceftriaxone, Nitrofurantoin monohyd macro, and Sulfa antibiotics    Review of Systems   Review of Systems  Gastrointestinal:  Positive for abdominal pain and diarrhea.  Neurological:  Positive for weakness.    Physical Exam Updated Vital Signs BP 130/75 (BP Location: Left Arm)   Pulse 79   Temp 98.6 F (37 C) (Oral)   Resp 16   Ht '5\' 8"'$  (1.727 m)   Wt 90.7 kg   SpO2 100%   BMI 30.41 kg/m  Physical Exam Vitals and nursing note reviewed.  Constitutional:      General: She is not in acute distress.     Appearance: She is well-developed. She is not diaphoretic.  HENT:     Head: Normocephalic and atraumatic.  Eyes:     Pupils: Pupils are equal, round, and reactive to light.  Cardiovascular:     Rate and Rhythm: Normal rate and regular rhythm.     Heart sounds: No murmur heard.    No friction rub. No gallop.  Pulmonary:     Effort: Pulmonary effort is normal.     Breath sounds: No wheezing or rales.  Abdominal:     General: There is no distension.     Palpations: Abdomen is soft.     Tenderness: There is no abdominal tenderness.     Comments: No significant abdominal discomfort on exam patient points to the suprapubic area as region of most pain.  Musculoskeletal:        General: No tenderness.     Cervical back: Normal range of motion and neck supple.  Skin:    General: Skin is warm and dry.  Neurological:     Mental Status: She is alert and oriented to person, place, and time.  Psychiatric:        Behavior: Behavior normal.     ED Results / Procedures / Treatments   Labs (all labs ordered are listed, but only abnormal results are displayed) Labs Reviewed  COMPREHENSIVE METABOLIC PANEL - Abnormal; Notable for the following components:      Result Value   Potassium 2.8 (*)    Glucose, Bld 131 (*)    All other components within normal limits  CBC WITH DIFFERENTIAL/PLATELET - Abnormal; Notable for the following components:   WBC 23.4 (*)    RBC 5.40 (*)    Hemoglobin 16.0 (*)    RDW 16.7 (*)    Neutro Abs 16.7 (*)    Lymphs Abs 4.2 (*)    Monocytes Absolute 1.4 (*)    Eosinophils Absolute 0.6 (*)    Abs Immature Granulocytes 0.28 (*)    All other components within normal limits  URINALYSIS, ROUTINE W REFLEX MICROSCOPIC - Abnormal; Notable for the following components:   Color, Urine STRAW (*)    Specific Gravity, Urine 1.003 (*)    Leukocytes,Ua SMALL (*)    All other components within normal limits  CBG MONITORING, ED - Abnormal; Notable for the following  components:   Glucose-Capillary 131 (*)    All other components within normal limits  CULTURE, BLOOD (ROUTINE X 2)  CULTURE, BLOOD (ROUTINE X 2)  URINE CULTURE  PROTIME-INR  APTT  LACTIC ACID, PLASMA  LACTIC ACID, PLASMA    EKG EKG Interpretation  Date/Time:  Monday January 24 2022 09:06:57 EDT Ventricular Rate:  62 PR Interval:  177 QRS Duration: 99 QT Interval:  482 QTC Calculation: 490 R Axis:   -24 Text Interpretation: Sinus rhythm Borderline left axis deviation Borderline T abnormalities, anterior leads Borderline prolonged QT interval Baseline wander TECHNICALLY DIFFICULT Otherwise no significant change Confirmed by Deno Etienne [phone]) on 01/24/2022 9:11:47 AM  Radiology CT Renal Stone Study  Result Date: 01/24/2022 CLINICAL DATA:  57 year old female with weakness, nausea, diarrhea, abdominal pain. EXAM: CT ABDOMEN AND PELVIS WITHOUT CONTRAST TECHNIQUE: Multidetector CT imaging of the abdomen and pelvis was performed following the standard protocol without IV contrast. RADIATION DOSE REDUCTION: This exam was performed according to the departmental dose-optimization program which includes automated exposure control, adjustment of the mA and/or kV according to patient size and/or use of iterative reconstruction technique. COMPARISON:  CT Abdomen and Pelvis with contrast 12/26/2021 and earlier. FINDINGS: Lower chest: Improved lung volumes and lung base ventilation. Mild residual scarring and/or atelectasis. No cardiomegaly, pericardial effusion, pleural effusion. Hepatobiliary: Negative noncontrast liver and gallbladder except for mild fatty sparing adjacent to the gallbladder fossa. Pancreas: Negative noncontrast pancreas. Spleen: Negative. Adrenals/Urinary Tract: Normal adrenal glands. Extensive bilateral nephrolithiasis, mostly punctate calculi. No convincing hydronephrosis or pararenal inflammation. And both ureters are decompressed to the bladder. There is mild asymmetric left ureter  urothelial thickening, but no convincing Peri ureteral inflammation. Decompressed and negative bladder. Stomach/Bowel: Redundant distal large bowel with retained stool. Mild if any diverticular disease. Similar retained stool in the proximal colon. No active large bowel inflammation. Normal appendix adjacent to the cecum on series 4, image 47. Decompressed terminal ileum. No dilated small bowel. Decompressed stomach. No free air or free fluid. Vascular/Lymphatic: Aortoiliac calcified atherosclerosis. Normal caliber abdominal aorta. No lymphadenopathy identified. Reproductive: Surgically absent uterus. Diminutive or absent ovaries. Other: No pelvic free fluid.  Small pelvic phleboliths. Musculoskeletal: Osteopenia. No acute osseous abnormality identified. IMPRESSION: 1. Extensive bilateral nephrolithiasis. No ureteral calculus or obstructive uropathy. 2. No acute or inflammatory process identified in the noncontrast abdomen or pelvis. 3. Aortic Atherosclerosis (ICD10-I70.0). Electronically Signed   By: Genevie Ann M.D.   On: 01/24/2022 09:52   DG Chest Port 1 View  Result Date: 01/24/2022 CLINICAL DATA:  56 year old female with possible sepsis. EXAM: PORTABLE CHEST 1 VIEW COMPARISON:  Portable chest 12/26/2021 and earlier. FINDINGS: Portable AP semi upright view at 0916 hours. Improved lung volumes from last month. Normal cardiac size and mediastinal contours. Visualized tracheal air column is within normal limits. Allowing for portable technique the lungs are clear. No pneumothorax. No acute osseous abnormality identified. Paucity of bowel gas in the upper abdomen. IMPRESSION: Negative portable chest. Electronically Signed   By: Genevie Ann M.D.   On: 01/24/2022 09:22    Procedures Procedures    Medications Ordered in ED Medications  potassium chloride (KLOR-CON) packet 40 mEq (has no administration in time range)  magnesium oxide (MAG-OX) tablet 800 mg (has no administration in time range)  lactated ringers  bolus 1,000 mL (1,000 mLs Intravenous New Bag/Given 01/24/22 1022)  HYDROmorphone (DILAUDID) injection 0.5 mg (0.5 mg Intravenous Given 01/24/22 1022)  prochlorperazine (COMPAZINE) injection 10 mg (10 mg Intravenous Given 01/24/22 1021)  diphenhydrAMINE (BENADRYL) injection 25 mg (25 mg Intravenous Given 01/24/22 1019)    ED Course/ Medical Decision Making/ A&P                           Medical Decision Making Amount and/or Complexity of Data Reviewed Labs: ordered. Radiology: ordered. ECG/medicine tests: ordered.  Risk OTC  drugs. Prescription drug management.   56 yo F with a chief complaints of intractable nausea vomiting and suprapubic abdominal discomfort after having a ureteral stent placement about 48 hours ago for kidney stones.  She is quite dehydrated clinically on exam.  We will give a bolus of IV fluids.  Blood work treat pain and nausea CT stone study reassess.  CT stone study independently interpreted by me without obvious hydronephrosis or stone to the left ureter.  No significant change in renal function.  Patient's hypokalemic consistent with her persistent nausea and vomiting.  No significant anemia.  Elevated white count looks like it is chronically elevated but a bit higher than typically.  Chest x-ray independently interpreted by me without focal trait or pneumothorax.  UA independently interpreted by me negative for infection.  On reassessment the patient is feeling much better and would like to go home.  12:18 PM:  I have discussed the diagnosis/risks/treatment options with the patient and family.  Evaluation and diagnostic testing in the emergency department does not suggest an emergent condition requiring admission or immediate intervention beyond what has been performed at this time.  They will follow up with  Urology. We also discussed returning to the ED immediately if new or worsening sx occur. We discussed the sx which are most concerning (e.g., sudden worsening  pain, fever, inability to tolerate by mouth) that necessitate immediate return. Medications administered to the patient during their visit and any new prescriptions provided to the patient are listed below.  Medications given during this visit Medications  potassium chloride (KLOR-CON) packet 40 mEq (has no administration in time range)  magnesium oxide (MAG-OX) tablet 800 mg (has no administration in time range)  lactated ringers bolus 1,000 mL (1,000 mLs Intravenous New Bag/Given 01/24/22 1022)  HYDROmorphone (DILAUDID) injection 0.5 mg (0.5 mg Intravenous Given 01/24/22 1022)  prochlorperazine (COMPAZINE) injection 10 mg (10 mg Intravenous Given 01/24/22 1021)  diphenhydrAMINE (BENADRYL) injection 25 mg (25 mg Intravenous Given 01/24/22 1019)     The patient appears reasonably screen and/or stabilized for discharge and I doubt any other medical condition or other [facility] requiring further screening, evaluation, or treatment in the ED at this time prior to discharge.          Final Clinical Impression(s) / ED Diagnoses Final diagnoses:  Lower abdominal pain  Nausea and vomiting in adult    Rx / DC Orders ED Discharge Orders          Ordered    promethazine (PHENERGAN) 25 MG tablet  Every 6 hours PRN        01/24/22 1211    ondansetron (ZOFRAN-ODT) 4 MG disintegrating tablet        01/24/22 1211              Deno Etienne, DO 01/24/22 1218

## 2022-01-24 NOTE — Discharge Instructions (Signed)
Your potassium was a bit low.  Typically I would have you try something high in potassium with each meal for at least the next week.  Have it rechecked by your family doctor in the next week or 2.  Most people start with a brat diet after a episode of nausea and vomiting, this is not appealing but is thought to be gentle in your stomach.  Please return for worsening pain fever or inability to eat or drink.  Please let your urologist know what happened and see when they want to see you in the office.

## 2022-01-25 LAB — CALCULI, WITH PHOTOGRAPH (CLINICAL LAB)
Calcium Oxalate Dihydrate: 80 %
Calcium Oxalate Monohydrate: 20 %
Weight Calculi: 78 mg

## 2022-01-25 LAB — URINE CULTURE: Culture: NO GROWTH

## 2022-01-28 ENCOUNTER — Ambulatory Visit: Payer: Managed Care, Other (non HMO) | Admitting: Family

## 2022-01-28 ENCOUNTER — Encounter: Payer: Self-pay | Admitting: Family

## 2022-01-28 VITALS — BP 112/68 | HR 84 | Temp 98.3°F | Resp 16 | Ht 68.0 in | Wt 200.4 lb

## 2022-01-28 DIAGNOSIS — R1084 Generalized abdominal pain: Secondary | ICD-10-CM

## 2022-01-28 DIAGNOSIS — A498 Other bacterial infections of unspecified site: Secondary | ICD-10-CM

## 2022-01-28 DIAGNOSIS — D72829 Elevated white blood cell count, unspecified: Secondary | ICD-10-CM

## 2022-01-28 DIAGNOSIS — R7989 Other specified abnormal findings of blood chemistry: Secondary | ICD-10-CM | POA: Diagnosis not present

## 2022-01-28 DIAGNOSIS — F411 Generalized anxiety disorder: Secondary | ICD-10-CM

## 2022-01-28 DIAGNOSIS — E876 Hypokalemia: Secondary | ICD-10-CM

## 2022-01-28 DIAGNOSIS — R5383 Other fatigue: Secondary | ICD-10-CM

## 2022-01-28 DIAGNOSIS — E872 Acidosis, unspecified: Secondary | ICD-10-CM

## 2022-01-28 NOTE — Assessment & Plan Note (Signed)
Repeat lactic acid, was elevated at ER.  Septic precautions given to pt, if any fever go to er

## 2022-01-28 NOTE — Assessment & Plan Note (Signed)
Overall abdominal tenderness, workup for abd panel in place.  Reviewed CT renal and Ct abd pelvis Advised pt any fever, worsening pain, vomiting go to ER immediately as concern for sepsis

## 2022-01-29 LAB — CULTURE, BLOOD (ROUTINE X 2): Culture: NO GROWTH

## 2022-01-29 LAB — URINE CULTURE
MICRO NUMBER:: 13564928
Result:: NO GROWTH
SPECIMEN QUALITY:: ADEQUATE

## 2022-01-29 LAB — AMYLASE: Amylase: 41 U/L (ref 21–101)

## 2022-01-29 LAB — LIPASE: Lipase: 27 U/L (ref 7–60)

## 2022-01-30 ENCOUNTER — Encounter: Payer: Self-pay | Admitting: Family

## 2022-01-31 ENCOUNTER — Other Ambulatory Visit: Payer: Self-pay | Admitting: Family

## 2022-01-31 ENCOUNTER — Ambulatory Visit (INDEPENDENT_AMBULATORY_CARE_PROVIDER_SITE_OTHER)
Admission: RE | Admit: 2022-01-31 | Discharge: 2022-01-31 | Disposition: A | Discharge status: Home / Self Care | Payer: Managed Care, Other (non HMO) | Source: Ambulatory Visit | Attending: Family | Admitting: Family

## 2022-01-31 ENCOUNTER — Encounter: Payer: Self-pay | Admitting: *Deleted

## 2022-01-31 DIAGNOSIS — R5383 Other fatigue: Secondary | ICD-10-CM

## 2022-01-31 DIAGNOSIS — D72829 Elevated white blood cell count, unspecified: Secondary | ICD-10-CM

## 2022-01-31 DIAGNOSIS — R7989 Other specified abnormal findings of blood chemistry: Secondary | ICD-10-CM

## 2022-01-31 DIAGNOSIS — R6883 Chills (without fever): Secondary | ICD-10-CM

## 2022-02-02 ENCOUNTER — Inpatient Hospital Stay: Payer: Managed Care, Other (non HMO) | Attending: Internal Medicine | Admitting: Internal Medicine

## 2022-02-02 ENCOUNTER — Inpatient Hospital Stay: Payer: Managed Care, Other (non HMO)

## 2022-02-02 ENCOUNTER — Other Ambulatory Visit: Payer: Self-pay

## 2022-02-02 ENCOUNTER — Encounter: Payer: Self-pay | Admitting: Internal Medicine

## 2022-02-02 DIAGNOSIS — D72828 Other elevated white blood cell count: Secondary | ICD-10-CM | POA: Diagnosis present

## 2022-02-02 DIAGNOSIS — F319 Bipolar disorder, unspecified: Secondary | ICD-10-CM | POA: Diagnosis not present

## 2022-02-02 DIAGNOSIS — Z79899 Other long term (current) drug therapy: Secondary | ICD-10-CM | POA: Diagnosis not present

## 2022-02-02 DIAGNOSIS — F32A Depression, unspecified: Secondary | ICD-10-CM | POA: Insufficient documentation

## 2022-02-02 DIAGNOSIS — F17210 Nicotine dependence, cigarettes, uncomplicated: Secondary | ICD-10-CM | POA: Insufficient documentation

## 2022-02-02 DIAGNOSIS — R61 Generalized hyperhidrosis: Secondary | ICD-10-CM

## 2022-02-02 LAB — CBC WITH DIFFERENTIAL/PLATELET
Abs Immature Granulocytes: 0.19 10*3/uL — ABNORMAL HIGH (ref 0.00–0.07)
Basophils Absolute: 0.1 10*3/uL (ref 0.0–0.1)
Basophils Relative: 1 %
Eosinophils Absolute: 0.6 10*3/uL — ABNORMAL HIGH (ref 0.0–0.5)
Eosinophils Relative: 3 %
HCT: 43 % (ref 36.0–46.0)
Hemoglobin: 15.3 g/dL — ABNORMAL HIGH (ref 12.0–15.0)
Immature Granulocytes: 1 %
Lymphocytes Relative: 18 %
Lymphs Abs: 3.9 10*3/uL (ref 0.7–4.0)
MCH: 29.8 pg (ref 26.0–34.0)
MCHC: 35.6 g/dL (ref 30.0–36.0)
MCV: 83.7 fL (ref 80.0–100.0)
Monocytes Absolute: 1 10*3/uL (ref 0.1–1.0)
Monocytes Relative: 5 %
Neutro Abs: 15.8 10*3/uL — ABNORMAL HIGH (ref 1.7–7.7)
Neutrophils Relative %: 72 %
Platelets: 392 10*3/uL (ref 150–400)
RBC: 5.14 MIL/uL — ABNORMAL HIGH (ref 3.87–5.11)
RDW: 15.9 % — ABNORMAL HIGH (ref 11.5–15.5)
WBC: 21.6 10*3/uL — ABNORMAL HIGH (ref 4.0–10.5)
nRBC: 0 % (ref 0.0–0.2)

## 2022-02-02 LAB — COMPREHENSIVE METABOLIC PANEL
ALT: 16 U/L (ref 0–44)
AST: 26 U/L (ref 15–41)
Albumin: 3.8 g/dL (ref 3.5–5.0)
Alkaline Phosphatase: 87 U/L (ref 38–126)
Anion gap: 9 (ref 5–15)
BUN: 7 mg/dL (ref 6–20)
CO2: 29 mmol/L (ref 22–32)
Calcium: 8.5 mg/dL — ABNORMAL LOW (ref 8.9–10.3)
Chloride: 95 mmol/L — ABNORMAL LOW (ref 98–111)
Creatinine, Ser: 1 mg/dL (ref 0.44–1.00)
GFR, Estimated: >60 mL/min (ref >60)
Glucose, Bld: 104 mg/dL — ABNORMAL HIGH (ref 70–99)
Potassium: 2.9 mmol/L — ABNORMAL LOW (ref 3.5–5.1)
Sodium: 133 mmol/L — ABNORMAL LOW (ref 135–145)
Total Bilirubin: 0.9 mg/dL (ref 0.3–1.2)
Total Protein: 7.2 g/dL (ref 6.5–8.1)

## 2022-02-02 LAB — TECHNOLOGIST SMEAR REVIEW
Plt Morphology: NORMAL
RBC MORPHOLOGY: NORMAL
WBC MORPHOLOGY: NORMAL

## 2022-02-02 LAB — C-REACTIVE PROTEIN: CRP: 0.6 mg/dL (ref <1.0)

## 2022-02-02 LAB — LACTATE DEHYDROGENASE: LDH: 114 U/L (ref 98–192)

## 2022-02-02 NOTE — Progress Notes (Signed)
New patient evaluation.  Patient recently treated/hospitalized at [facility] in [place].  All the symptoms she is having are new and finding it difficult to cope.  Is established with a psychiatrist.    The symptoms include  --wt loss of 20 lbs since 12/26/21 with loss of appetite and reports that she doesn't eat.  Also having difficulty swallowing medication due to feeling like throat wants to close.  --nausea/vomiting  --extreme fatigue/weakness  --tremors  --muscle weakness/soreness.    --constipation with last bowel movent 6 days ago  --low abdominal discomfort/pain on 6/10 pain scale today.  --urinary incontinence

## 2022-02-02 NOTE — Assessment & Plan Note (Addendum)
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  #Long discussion with the patient regarding multiple etiologies of elevated white count including but not limited to infection; inflammation; malignancy/leukemia etc.  Clinically suspicion for malignancy is small Recommend checking CBC CMP CRP LDH review of peripheral smear; peripheral blood flow cytometry; BCR ABL FISH; JAK2 mutation; CALR/MPL work-up.  Discussed regarding bone marrow biopsy based upon above work-up.  However, hold bone marrow biopsy at this time.  #Fatigue: Again unclear if patient's fatigue is related to her above abnormal blood counts versus other causes including depression.  Discussed that we will review of her work-up; and reach out to PCP regarding recommendations for work etc.  #Bilateral nephrolithiasis: Status post lithotripsy  [Dr.Herrick]-   #Active smoker: Discussed with RT  Thank you Dr.Cody  for allowing me to participate in the care of your pleasant patient. Please do not hesitate to contact me with questions or concerns in the interim.  mychart-  # DISPOSITION: # labs ordered today # follow up in 2-3 weeks- MD; no labs- Dr.B

## 2022-02-02 NOTE — Progress Notes (Signed)
[facility] OFFICE PROGRESS NOTE  Patient Care Team: [name], MD as PCP - General (Family Medicine) Cammie Sickle, MD as Consulting Physician (Oncology)   # HEMATOLOGY HISTORY:  # LEUCOCYTOSIS- WBC-20-23; predominant neutrophilia; lymphocytosis; monocytosis  Hb- 16; platelets- 436 [LL-400]  #Bipolar disorder/Depression-on lithium  1. Obstructing 6 mm calculus in the proximal third of the left ureter resulting in mild left hydroureteronephrosis. 2. Findings suggestive of hepatic steatosis.   Aortic Atherosclerosis (ICD10-I70.0).  Oncology History   No history exists.     HPI: with mother; ambulating independently.  Jennifer [name] 56 y.o.  female pleasant patient has been referred to [place] for further evaluation of leukocytosis.  Patient has a history of chronic leukocytosis for the last 10 years or more-however in May 2023 noted to have white count of 50-which was admitted to hospital for UTI/nephrolithiasis.  Also had episode of C. difficile.  Currently denies any infections.  Denies any fevers.   Night sweats:  intermittent Weight loss:mild sec to kidney stones  Infections:NONE Splenectomy: NONE Steroids: NONE; on lithium Smoke: YES Allergies: sulfa/macrobid.  Skin rash:none  Review of Systems  Constitutional:  Positive for malaise/fatigue. Negative for chills, diaphoresis, fever and weight loss.  HENT:  Negative for nosebleeds and sore throat.   Eyes:  Negative for double vision.  Respiratory:  Negative for cough, hemoptysis, sputum production, shortness of breath and wheezing.   Cardiovascular:  Negative for chest pain, palpitations, orthopnea and leg swelling.  Gastrointestinal:  Positive for abdominal pain, nausea and vomiting. Negative for blood in stool, constipation, diarrhea, heartburn and melena.  Genitourinary:  Negative for dysuria, frequency and urgency.  Musculoskeletal:  Positive for back pain and joint pain.  Skin:  Negative.  Negative for itching and rash.  Neurological:  Positive for dizziness and weakness. Negative for tingling, focal weakness and headaches.  Endo/Heme/Allergies:  Does not bruise/bleed easily.  Psychiatric/Behavioral:  Negative for depression. The patient is not nervous/anxious and does not have insomnia.       PAST MEDICAL HISTORY :  Past Medical History:  Diagnosis Date   Anxiety    Cancer ([place])    Depression    Diverticulosis    Fundic gland polyps of stomach, benign    History of kidney stones    Low HDL (under 40)    Personal history of adenomatous colonic polyps 05/08/2012   Rosacea    Tenosynovitis, de Quervain    Urosepsis 10/2011   after stent    PAST SURGICAL HISTORY :   Past Surgical History:  Procedure Laterality Date   CERVICAL BIOPSY  W/ LOOP ELECTRODE EXCISION  04/2003   COLONOSCOPY     CYSTOSCOPY W/ URETERAL STENT PLACEMENT  10/14/2011   Procedure: CYSTOSCOPY WITH RETROGRADE PYELOGRAM/URETERAL STENT PLACEMENT;  Surgeon: Hanley Ben, MD;  Location: [facility];  Service: Urology;  Laterality: Right;  Right Double J Stent   CYSTOSCOPY W/ URETERAL STENT PLACEMENT Left 12/26/2021   Procedure: CYSTOSCOPY WITH RETROGRADE PYELOGRAM/URETERAL STENT PLACEMENT;  Surgeon: Ardis Hughs, MD;  Location: [facility];  Service: Urology;  Laterality: Left;   CYSTOSCOPY/URETEROSCOPY/HOLMIUM LASER/STENT PLACEMENT Left 01/19/2022   Procedure: LEFT URETEROSCOPY/STENT EXCHANGE/BASKET STONE REMOVAL;  Surgeon: Ardis Hughs, MD;  Location: [facility];  Service: Urology;  Laterality: Left;   MULTIPLE TOOTH EXTRACTIONS     OOPHORECTOMY     left ovary   POLYPECTOMY     TUBAL LIGATION     UMBILICAL HERNIA REPAIR     UPPER GASTROINTESTINAL ENDOSCOPY  2008   VAGINAL HYSTERECTOMY     WISDOM TOOTH EXTRACTION      FAMILY HISTORY :   Family History  Problem Relation Age of Onset   Nephrolithiasis Mother    Skin cancer Mother        from radiation tx   Hyperlipidemia Mother     Squamous cell carcinoma Mother    CVA Father 55   Fibroids Sister    Other Sister        breast cyst   Skin cancer Sister    Hernia Maternal Grandmother    Pancreatic cancer Maternal Grandfather    Alcohol abuse Paternal Grandmother    Depression Paternal Grandmother    Suicidality Paternal Aunt    Depression Paternal Aunt    Non-Hodgkin's lymphoma Maternal Great-grandmother    Irritable bowel syndrome Other    Anesthesia problems Neg Hx    Hypotension Neg Hx    Malignant hyperthermia Neg Hx    Pseudochol deficiency Neg Hx    Colon cancer Neg Hx    Colon polyps Neg Hx    Esophageal cancer Neg Hx    Stomach cancer Neg Hx    Rectal cancer Neg Hx     SOCIAL HISTORY:   Social History   Tobacco Use   Smoking status: Every Day    Packs/day: 0.25    Years: 30.00    Total pack years: 7.50    Types: Cigarettes   Smokeless tobacco: Never  Vaping Use   Vaping Use: Never used  Substance Use Topics   Alcohol use: No   Drug use: No    ALLERGIES:  is allergic to ceftriaxone, nitrofurantoin monohyd macro, and sulfa antibiotics.  MEDICATIONS:  Current Outpatient Medications  Medication Sig Dispense Refill   acetaminophen (TYLENOL) 325 MG tablet Take 650 mg by mouth every 6 (six) hours as needed for moderate pain.     albuterol (VENTOLIN HFA) 108 (90 Base) MCG/ACT inhaler Inhale 1-2 puffs into the lungs every 6 (six) hours as needed for wheezing or shortness of breath. 6.7 g 0   ASPIRIN LOW DOSE 81 MG EC tablet TAKE ONE TABLET BY MOUTH DAILY 90 tablet 3   atorvastatin (LIPITOR) 10 MG tablet Take 1 tablet (10 mg total) by mouth daily. 90 tablet 3   clindamycin (CLINDAGEL) 1 % gel Apply 1 application. topically daily as needed (For lips).     clonazePAM (KLONOPIN) 1 MG tablet Take 1 mg by mouth as needed for anxiety.     ipratropium (ATROVENT) 0.06 % nasal spray Place 1 spray into both nostrils daily as needed for rhinitis.     lithium carbonate (ESKALITH) 450 MG CR tablet Take 1  tablet (450 mg total) by mouth 2 (two) times daily. 180 tablet 0   oxybutynin (DITROPAN-XL) 5 MG 24 hr tablet Take 5 mg by mouth at bedtime.     PARoxetine (PAXIL) 40 MG tablet Take 1.5 tablets (60 mg total) by mouth every morning. 135 tablet 0   promethazine (PHENERGAN) 25 MG tablet Take 1 tablet (25 mg total) by mouth every 6 (six) hours as needed for nausea or vomiting. 30 tablet 0   sodium fluoride (FLUORISHIELD) 1.1 % GEL dental gel Place 1 application  onto teeth every other day.     traMADol (ULTRAM) 50 MG tablet Take 1-2 tablets (50-100 mg total) by mouth every 6 (six) hours as needed for moderate pain. 15 tablet 0   valACYclovir (VALTREX) 1000 MG tablet Take 1,000 mg by mouth  daily as needed (for fever blisters).     No current facility-administered medications for this visit.    PHYSICAL EXAMINATION:  BP 136/81 (BP Location: Left Arm, Patient Position: Sitting)   Pulse 65   Temp 98.7 F (37.1 C) (Tympanic)   Resp 18   Wt 202 lb 9.6 oz (91.9 kg)   BMI 30.81 kg/m   Filed Weights   02/02/22 1100  Weight: 202 lb 9.6 oz (91.9 kg)    Physical Exam Vitals and nursing note reviewed.  HENT:     Head: Normocephalic and atraumatic.     Mouth/Throat:     Pharynx: Oropharynx is clear.  Eyes:     Extraocular Movements: Extraocular movements intact.     Pupils: Pupils are equal, round, and reactive to light.  Cardiovascular:     Rate and Rhythm: Normal rate and regular rhythm.  Pulmonary:     Comments: Decreased breath sounds bilaterally.  Abdominal:     Palpations: Abdomen is soft.  Musculoskeletal:        General: Normal range of motion.     Cervical back: Normal range of motion.  Skin:    General: Skin is warm.  Neurological:     General: No focal deficit present.     Mental Status: She is alert and oriented to person, place, and time.  Psychiatric:        Behavior: Behavior normal.        Judgment: Judgment normal.        LABORATORY DATA:  I have reviewed  the data as listed    Component Value Date/Time   NA 133 (L) 02/02/2022 1201   NA 141 06/28/2021 1604   K 2.9 (L) 02/02/2022 1201   CL 95 (L) 02/02/2022 1201   CO2 29 02/02/2022 1201   GLUCOSE 104 (H) 02/02/2022 1201   BUN 7 02/02/2022 1201   BUN 8 06/28/2021 1604   CREATININE 1.00 02/02/2022 1201   CREATININE 0.85 01/28/2022 1506   CALCIUM 8.5 (L) 02/02/2022 1201   PROT 7.2 02/02/2022 1201   PROT 6.4 06/28/2021 1604   ALBUMIN 3.8 02/02/2022 1201   ALBUMIN 4.2 06/28/2021 1604   AST 26 02/02/2022 1201   ALT 16 02/02/2022 1201   ALKPHOS 87 02/02/2022 1201   BILITOT 0.9 02/02/2022 1201   BILITOT 0.3 06/28/2021 1604   GFRNONAA >60 02/02/2022 1201   GFRAA >60 08/10/2017 1122    No results found for: "SPEP", "UPEP"  Lab Results  Component Value Date   WBC 21.6 (H) 02/02/2022   NEUTROABS 15.8 (H) 02/02/2022   HGB 15.3 (H) 02/02/2022   HCT 43.0 02/02/2022   MCV 83.7 02/02/2022   PLT 392 02/02/2022      Chemistry      Component Value Date/Time   NA 133 (L) 02/02/2022 1201   NA 141 06/28/2021 1604   K 2.9 (L) 02/02/2022 1201   CL 95 (L) 02/02/2022 1201   CO2 29 02/02/2022 1201   BUN 7 02/02/2022 1201   BUN 8 06/28/2021 1604   CREATININE 1.00 02/02/2022 1201   CREATININE 0.85 01/28/2022 1506   GLU 95 09/11/2020 0000      Component Value Date/Time   CALCIUM 8.5 (L) 02/02/2022 1201   ALKPHOS 87 02/02/2022 1201   AST 26 02/02/2022 1201   ALT 16 02/02/2022 1201   BILITOT 0.9 02/02/2022 1201   BILITOT 0.3 06/28/2021 1604       RADIOGRAPHIC STUDIES: I have personally reviewed the radiological images as  listed and agreed with the findings in the report. DG Chest 2 View  Result Date: 01/31/2022 CLINICAL DATA:  Lack of energy. On Dec 21 2021, patient had a kidney stone, which led to sepsis and hospitalization on Dec 29 2021. Had C diff after hospitalization. EXAM: CHEST - 2 VIEW COMPARISON:  01/24/2022 FINDINGS: Lungs are clear. Heart size and mediastinal contours are  within normal limits. No effusion. Visualized bones unremarkable. IMPRESSION: No acute cardiopulmonary disease. Electronically Signed   By: Lucrezia Europe M.D.   On: 01/31/2022 13:54     ASSESSMENT & PLAN:  Acquired neutrophilia # LEUCOCYTOSIS/neutrophilia/lymphocytosis monocytosis-chronic [2010 mild]; May 2023-White count 50,000 [pyelonephritis/C.diff]-more recently drained at 20,000.  #Long discussion with the patient regarding multiple etiologies of elevated white count including but not limited to infection; inflammation; malignancy/leukemia etc.  Clinically suspicion for malignancy is small Recommend checking CBC CMP CRP LDH review of peripheral smear; peripheral blood flow cytometry; BCR ABL FISH; JAK2 mutation; CALR/MPL work-up.  Discussed regarding bone marrow biopsy based upon above work-up.  However, hold bone marrow biopsy at this time.  #Fatigue: Again unclear if patient's fatigue is related to her above abnormal blood counts versus other causes including depression.  Discussed that we will review of her work-up; and reach out to PCP regarding recommendations for work etc.  #Bilateral nephrolithiasis: Status post lithotripsy  [Dr.Herrick]-   #Active smoker: Discussed with RT  Thank you Dr.Cody  for allowing me to participate in the care of your pleasant patient. Please do not hesitate to contact me with questions or concerns in the interim.  mychart-  # DISPOSITION: # labs ordered today # follow up in 2-3 weeks- MD; no labs- Dr.B   Orders Placed This Encounter  Procedures   CBC with Differential/Platelet    Please do manual diff- and call if abnormal cells seen    Standing Status:   Future    Number of Occurrences:   1    Standing Expiration Date:   02/03/2023   Lactate dehydrogenase    Standing Status:   Future    Number of Occurrences:   1    Standing Expiration Date:   02/03/2023   Comprehensive metabolic panel    Standing Status:   Future    Number of Occurrences:   1     Standing Expiration Date:   02/03/2023   C-reactive protein    Standing Status:   Future    Number of Occurrences:   1    Standing Expiration Date:   02/03/2023   BCR-ABL1 FISH    Standing Status:   Future    Number of Occurrences:   1    Standing Expiration Date:   02/02/2023   Flow cytometry panel-leukemia/lymphoma work-up    Standing Status:   Future    Number of Occurrences:   1    Standing Expiration Date:   02/02/2023   JAK2 genotypr    Standing Status:   Future    Number of Occurrences:   1    Standing Expiration Date:   02/03/2023   MPL mutation analysis    Standing Status:   Future    Number of Occurrences:   1    Standing Expiration Date:   02/03/2023   Calreticulin (CALR) Mutation Analysis    Standing Status:   Future    Number of Occurrences:   1    Standing Expiration Date:   02/03/2023   All questions were answered. The patient knows to call  the clinic with any problems, questions or concerns.      Cammie Sickle, MD 02/02/2022 1:05 PM

## 2022-02-03 ENCOUNTER — Telehealth: Payer: Self-pay

## 2022-02-03 DIAGNOSIS — D72828 Other elevated white blood cell count: Secondary | ICD-10-CM

## 2022-02-03 LAB — CBC WITH DIFFERENTIAL/PLATELET
Absolute Monocytes: 1410 cells/uL — ABNORMAL HIGH (ref 200–950)
Basophils Absolute: 96 cells/uL (ref 0–200)
Basophils Relative: 0.4 %
Eosinophils Absolute: 382 cells/uL (ref 15–500)
Eosinophils Relative: 1.6 %
HCT: 48.2 % — ABNORMAL HIGH (ref 35.0–45.0)
Hemoglobin: 16.8 g/dL — ABNORMAL HIGH (ref 11.7–15.5)
Lymphs Abs: 5258 cells/uL — ABNORMAL HIGH (ref 850–3900)
MCH: 29.6 pg (ref 27.0–33.0)
MCHC: 34.9 g/dL (ref 32.0–36.0)
MCV: 85 fL (ref 80.0–100.0)
MPV: 10.3 fL (ref 7.5–12.5)
Monocytes Relative: 5.9 %
Neutro Abs: 16754 cells/uL — ABNORMAL HIGH (ref 1500–7800)
Neutrophils Relative %: 70.1 %
Platelets: 436 10*3/uL — ABNORMAL HIGH (ref 140–400)
RBC: 5.67 10*6/uL — ABNORMAL HIGH (ref 3.80–5.10)
RDW: 14.5 % (ref 11.0–15.0)
Total Lymphocyte: 22 %
WBC: 23.9 10*3/uL — ABNORMAL HIGH (ref 3.8–10.8)

## 2022-02-03 LAB — CULTURE, BLOOD (SINGLE)
MICRO NUMBER:: 13564924
MICRO NUMBER:: 13564940
Result:: NO GROWTH
SPECIMEN QUALITY:: ADEQUATE

## 2022-02-03 LAB — COMPREHENSIVE METABOLIC PANEL
AG Ratio: 1.4 (calc) (ref 1.0–2.5)
ALT: 15 U/L (ref 6–29)
AST: 16 U/L (ref 10–35)
Albumin: 4.1 g/dL (ref 3.6–5.1)
Alkaline phosphatase (APISO): 112 U/L (ref 37–153)
BUN: 7 mg/dL (ref 7–25)
CO2: 26 mmol/L (ref 20–32)
Calcium: 9.4 mg/dL (ref 8.6–10.4)
Chloride: 93 mmol/L — ABNORMAL LOW (ref 98–110)
Creat: 0.85 mg/dL (ref 0.50–1.03)
Globulin: 3 g/dL (calc) (ref 1.9–3.7)
Glucose, Bld: 88 mg/dL (ref 65–99)
Potassium: 3.3 mmol/L — ABNORMAL LOW (ref 3.5–5.3)
Sodium: 137 mmol/L (ref 135–146)
Total Bilirubin: 0.6 mg/dL (ref 0.2–1.2)
Total Protein: 7.1 g/dL (ref 6.1–8.1)

## 2022-02-03 LAB — B12 AND FOLATE PANEL
Folate: 9.3 ng/mL
Vitamin B-12: 267 pg/mL (ref 200–1100)

## 2022-02-03 LAB — LACTIC ACID, PLASMA: LACTIC ACID: 3.1 mmol/L — ABNORMAL HIGH (ref 0.4–1.8)

## 2022-02-03 LAB — MAGNESIUM: Magnesium: 2.7 mg/dL — ABNORMAL HIGH (ref 1.5–2.5)

## 2022-02-03 LAB — TSH: TSH: 1.35 mIU/L (ref 0.40–4.50)

## 2022-02-03 MED ORDER — POTASSIUM CHLORIDE CRYS ER 20 MEQ PO TBCR: 20.0000 meq | EXTENDED_RELEASE_TABLET | Freq: Two times a day (BID) | ORAL | 0 refills | Status: DC

## 2022-02-03 NOTE — Telephone Encounter (Signed)
-----   Message from Cammie Sickle, MD sent at 02/02/2022  4:21 PM EDT ----- Nira Conn please calling her prescription for kdur- 20 new twice a day 30 pills no refills  Also, order bloodwork-  BMP  at next visit.  Thank you, GB

## 2022-02-03 NOTE — Telephone Encounter (Signed)
We received a fax from [employer] for Ms Escareno regarding her short term disability claim.  I left a message for Ms Ault to call me back to get some information for filling out this form.

## 2022-02-03 NOTE — Telephone Encounter (Signed)
Please add lab encounter to next appt.

## 2022-02-03 NOTE — Telephone Encounter (Signed)
Rx sent to pharmacy and patient notified.

## 2022-02-03 NOTE — Telephone Encounter (Signed)
We received A short term disability form from [employer].  I put form in your inbox.    Patient is still out from her surgery.  She does not know what date she might return to work.  She is scheduled to see infectious disease on 02/15/22 and initial hematologist visit on 02/23/22.  The hematologist is referring to you for patient's return to work date.  Patient is sleeping up to 20 hours/day, having incontinence.  Is having to take stool softener, Tramadol, was put on Potassium today.  Also, the form requested that we send  -a treatment plan (including meds, frequency of treatment, referrals, physical therapy, etc).   -Restrictions and limitations that prevent(ed) patient from returning to work  I will print office notes once form is complete.  There is no due date listed on this form.  I will mail out copy for patient when complete

## 2022-02-04 ENCOUNTER — Telehealth: Payer: Self-pay

## 2022-02-04 LAB — COMP PANEL: LEUKEMIA/LYMPHOMA

## 2022-02-04 NOTE — Telephone Encounter (Signed)
Spoke to pt and she says she was just very tired today when she had the phone call and her deceased husbands birthday was this past week so she has been a little more down than normal. She does feel safe and she appreciates [place] checking in on her. PT made follow up appt for 02/24/22 and was made aware that if she needs to be seen sooner to please call us.

## 2022-02-04 NOTE — Telephone Encounter (Signed)
I put her out for another month.   If she is feeling better, she can return. Paperwork completed.

## 2022-02-04 NOTE — Telephone Encounter (Signed)
Gae Bon is calling in from [facility], she called patient to do monthly depression/anxiety screening. Niza scored a 19 on PHQ and the anxiety, GAD 7 was 14. They transferred patient to their crisis hotline incase but just wanted [place] to be aware and did advise patient to reach out to her psychiatrist.

## 2022-02-04 NOTE — Telephone Encounter (Signed)
Agree with plan to see psychiatrist - this may be impacting her energy/fatigue.   Please let pt know that I put her out of work for another month.   I would like to see her for follow-up after she sees psych, ID, and hematology. Please schedule  If she wants to be seen sooner to discuss fatigue she can schedule with myself or tabitha (as I will be out of town for the next week).

## 2022-02-05 LAB — BCR-ABL1 FISH
Cells Analyzed: 200
Cells Counted: 200

## 2022-02-06 ENCOUNTER — Encounter: Payer: Self-pay | Admitting: Internal Medicine

## 2022-02-06 LAB — JAK2 GENOTYPR

## 2022-02-07 ENCOUNTER — Telehealth: Payer: Self-pay | Admitting: Family Medicine

## 2022-02-07 ENCOUNTER — Telehealth: Payer: Self-pay

## 2022-02-07 NOTE — Telephone Encounter (Signed)
Forms from [employer] received to be filled out for disability.   Declined to sign by Dr. Rogue Bussing.

## 2022-02-07 NOTE — Telephone Encounter (Signed)
RN Spoke with Merrily Pew, NP and Judson Roch, [place]- advised patient to contact pcp or go to urgent care for further symptom management.  Pt expressed understanding of the plan of care.

## 2022-02-07 NOTE — Telephone Encounter (Signed)
FMLA paperwork was faxed to [phone].  Copy made for myself, scanning, and Ms Rix.  I put Ms Reihl' copy in the outgoing mail.

## 2022-02-07 NOTE — Telephone Encounter (Signed)
Patient called and said for the last few days shes been dizzy and shaky, I transferred her over to the triage nurse for further eval

## 2022-02-09 ENCOUNTER — Ambulatory Visit: Payer: Managed Care, Other (non HMO) | Admitting: Family Medicine

## 2022-02-09 VITALS — BP 120/78 | HR 66 | Temp 97.1°F | Ht 68.0 in | Wt 203.5 lb

## 2022-02-09 DIAGNOSIS — R42 Dizziness and giddiness: Secondary | ICD-10-CM | POA: Diagnosis not present

## 2022-02-09 DIAGNOSIS — I69398 Other sequelae of cerebral infarction: Secondary | ICD-10-CM

## 2022-02-09 DIAGNOSIS — R251 Tremor, unspecified: Secondary | ICD-10-CM | POA: Diagnosis not present

## 2022-02-09 NOTE — Progress Notes (Signed)
Patient ID: [name], female    DOB: [birth year], 56 y.o.   MRN: [id number]  This visit was conducted in person.  BP 120/78   Pulse 66   Temp (!) 97.1 F (36.2 C) (Temporal)   Ht '5\' 8"'$  (1.727 m)   Wt 203 lb 8 oz (92.3 kg)   SpO2 97%   BMI 30.94 kg/m    CC:  Chief Complaint  Patient presents with   Dizziness    Has been going on since May but has gotten worse over last few weeks. Denies nausea/vomiting.   Shaking    Hands and legs   Extremity Weakness    In legs     Subjective:   HPI: ARNETT GALINDEZ is a 56 y.o. female pt of Dr. Einar Pheasant with history of  MDD, GAD presenting on 02/09/2022 for Dizziness (Has been going on since May but has gotten worse over last few weeks. Denies nausea/vomiting.), Shaking (Hands and legs), and Extremity Weakness (In legs )  Seeing Dr. Burlene Arnt for chronic leukocytosis note from 02/02/2022 reviewed in detail At that office visit she had noted a weight loss of 20 pounds since 12/26/2021 with loss of appetite, nausea/vomiting, extreme fatigue and weakness, tremors, muscle weakness, constipation lower abdominal discomfort and pain and urinary incontinence. Reviewed recent labs in detail.    Reviewed labs and OV from [place] from 01/31/22... B12 started as was low, TSH nml, cbc abnormal as above. Blood culture negative.    She has been feeling tremor since she was in hospital UTI, kidney stone sepsis, cdiff  abn wbc.  She does not note shaking if lying down.  Extreme fatigue.   Has dizziness where room shift.. notes when moving head.. none when still.  No history of tremor or vertigo.  She was on heavy duty antibiotics.Marland Kitchen only new med potassium.  Wt Readings from Last 3 Encounters:  02/09/22 203 lb 8 oz (92.3 kg)  02/02/22 202 lb 9.6 oz (91.9 kg)  01/28/22 200 lb 6 oz (90.9 kg)    Rarely using phenergan.  She has been on paxil  and lithium longterm Dr. Doyne Keel  Recently increased dose of lithium 4 months ago by psychiatry.  Has  not had level recehcked since 11/202  Relevant past medical, surgical, family and social history reviewed and updated as indicated. Interim medical history since our last visit reviewed. Allergies and medications reviewed and updated. Outpatient Medications Prior to Visit  Medication Sig Dispense Refill   acetaminophen (TYLENOL) 325 MG tablet Take 650 mg by mouth every 6 (six) hours as needed for moderate pain.     albuterol (VENTOLIN HFA) 108 (90 Base) MCG/ACT inhaler Inhale 1-2 puffs into the lungs every 6 (six) hours as needed for wheezing or shortness of breath. 6.7 g 0   ASPIRIN LOW DOSE 81 MG EC tablet TAKE ONE TABLET BY MOUTH DAILY 90 tablet 3   atorvastatin (LIPITOR) 10 MG tablet Take 1 tablet (10 mg total) by mouth daily. 90 tablet 3   bisacodyl (DULCOLAX) 5 MG EC tablet Take 15 mg by mouth daily as needed for moderate constipation.     clindamycin (CLINDAGEL) 1 % gel Apply 1 application. topically daily as needed (For lips).     clonazePAM (KLONOPIN) 1 MG tablet Take 1 mg by mouth as needed for anxiety.     ipratropium (ATROVENT) 0.06 % nasal spray Place 1 spray into both nostrils daily as needed for rhinitis.     lithium carbonate (  ESKALITH) 450 MG CR tablet Take 1 tablet (450 mg total) by mouth 2 (two) times daily. 180 tablet 0   oxybutynin (DITROPAN-XL) 5 MG 24 hr tablet Take 5 mg by mouth at bedtime.     PARoxetine (PAXIL) 40 MG tablet Take 1.5 tablets (60 mg total) by mouth every morning. 135 tablet 0   potassium chloride SA (KLOR-CON M) 20 MEQ tablet Take 1 tablet (20 mEq total) by mouth 2 (two) times daily. 30 tablet 0   promethazine (PHENERGAN) 25 MG tablet Take 1 tablet (25 mg total) by mouth every 6 (six) hours as needed for nausea or vomiting. 30 tablet 0   sodium fluoride (FLUORISHIELD) 1.1 % GEL dental gel Place 1 application  onto teeth every other day.     traMADol (ULTRAM) 50 MG tablet Take 1-2 tablets (50-100 mg total) by mouth every 6 (six) hours as needed for moderate  pain. 15 tablet 0   valACYclovir (VALTREX) 1000 MG tablet Take 1,000 mg by mouth daily as needed (for fever blisters).     No facility-administered medications prior to visit.     Per HPI unless specifically indicated in ROS section below Review of Systems  Constitutional:  Negative for fatigue and fever.  HENT:  Negative for congestion.   Eyes:  Negative for pain.  Respiratory:  Negative for cough and shortness of breath.   Cardiovascular:  Negative for chest pain, palpitations and leg swelling.  Gastrointestinal:  Negative for abdominal pain.  Genitourinary:  Negative for dysuria and vaginal bleeding.  Musculoskeletal:  Negative for back pain.  Neurological:  Positive for dizziness, weakness and light-headedness. Negative for syncope and headaches.  Psychiatric/Behavioral:  Negative for dysphoric mood.    Objective:  BP 120/78   Pulse 66   Temp (!) 97.1 F (36.2 C) (Temporal)   Ht '5\' 8"'$  (1.727 m)   Wt 203 lb 8 oz (92.3 kg)   SpO2 97%   BMI 30.94 kg/m   Wt Readings from Last 3 Encounters:  02/09/22 203 lb 8 oz (92.3 kg)  02/02/22 202 lb 9.6 oz (91.9 kg)  01/28/22 200 lb 6 oz (90.9 kg)      Physical Exam    Results for orders placed or performed in visit on 02/02/22  Technologist smear review  Result Value Ref Range   WBC MORPHOLOGY Normal RBC, WBC, and platelet    RBC MORPHOLOGY Normal RBC, WBC, and platelet    Tech Review Normal RBC, WBC, and platelet      COVID 19 screen:  No recent travel or known exposure to [place] The patient denies respiratory symptoms of COVID 19 at this time. The importance of social distancing was discussed today.   Assessment and Plan    Problem List Items Addressed This Visit     Tremor - Primary   Relevant Orders   Lithium level (Completed)   Vertigo   Relevant Orders   Lithium level (Completed)   Concerning for possible side effects of lithium.  Will evaluate with lithium level today.  Otherwise exam within normal limits.   No neurologic red flags. She will contact psychiatry as side effects may not be due to lithium toxicity but just the medication itself.  She would likely do better on her previous lower dose.  Eliezer Lofts, MD

## 2022-02-09 NOTE — Telephone Encounter (Signed)
Patient Name: Jennifer [name] Gender: Female DOB: September 20, 1965 Age: 56 Y 51 M 26 D Return Phone Number: [phone] (Primary) Address: City/ State/Zip: [place] [address] Client [facility] - Client Client Site [place] - Day Provider Waunita Schooner- MD Contact Type Call Who Is Calling Patient / Member / Family / Caregiver Call Type Triage / Clinical Relationship To Patient Self Return Phone Number [phone] (Primary) Chief Complaint Dizziness Reason for Call Symptomatic / Request for [place] states she has been having dizzy spells and shaky. Translation No Nurse Assessment Nurse: Doyle Askew, RN, Beth Date/Time (Eastern Time): 02/07/2022 2:59:06 PM Confirm and document reason for call. If symptomatic, describe symptoms. ---Caller states she has been having dizzy spells and shaky. Caller has been having these spells for a couple of weeks, has seen Dr. Does the patient have any new or worsening symptoms? ---Yes Will a triage be completed? ---Yes Related visit to physician within the last 2 weeks? ---Yes Does the PT have any chronic conditions? (i.e. diabetes, asthma, this includes High risk factors for pregnancy, etc.) ---Yes List chronic conditions. ---hx of kidney stones Is this a behavioral health or substance abuse call? ---No Guidelines Guideline Title Affirmed Question Affirmed Notes Nurse Date/Time (Eastern Time) Dizziness - Lightheadedness [1] MODERATE dizziness (e.g., interferes with normal activities) [2] has been evaluated by doctor (or NP/PA) for this Doyle Askew, Therapist, sports, [place] 02/07/2022 3:00:26 PM Disp. Time Eilene Ghazi Time) Disposition Final User 02/07/2022 3:05:23 PM SEE PCP WITHIN 3 DAYS Yes Doyle Askew, RN, Beth PLEASE NOTE: All timestamps contained within this report are represented as [place] Standard Time. CONFIDENTIALTY NOTICE: This fax transmission is intended only for the addressee. It contains  information that is legally privileged, confidential or otherwise protected from use or disclosure. If you are not the intended recipient, you are strictly prohibited from reviewing, disclosing, copying using or disseminating any of this information or taking any action in reliance on or regarding this information. If you have received this fax in error, please notify us immediately by telephone so that we can arrange for its return to [place]. Phone: [phone], Toll-Free: [phone], Fax: [phone] Page: 2 of 2 Call Id: [id number] Final Disposition 02/07/2022 3:05:23 PM SEE PCP WITHIN 3 DAYS Yes Doyle Askew, RN, Beth Caller Disagree/Comply Comply Caller Understands Yes PreDisposition Go to Urgent Care/Walk-In Clinic Care Advice Given Per Guideline SEE PCP WITHIN 3 DAYS: * You need to be seen within 2 or 3 days. DRINK FLUIDS: * Drink several glasses of fruit juice, other clear fluids or water. LIE DOWN AND REST: * Lie down with feet elevated for 1 hour. * Avoid sudden movements of head. * Sit at side of bed for several minutes before standing up. Stand up slowly. PREVENTION - FALLS: CARE ADVICE given per Dizziness (Adult) guideline. * You become worse * Passes out (faints) CALL BACK IF: Referrals REFERRED TO PCP OFFICe

## 2022-02-09 NOTE — Patient Instructions (Addendum)
Please stop at the lab to have labs drawn. Call psychiatry to let them know about weight loss, symptoms and possibly decreasing lithium back to lower dose.

## 2022-02-10 ENCOUNTER — Telehealth (HOSPITAL_COMMUNITY): Payer: Self-pay | Admitting: *Deleted

## 2022-02-10 ENCOUNTER — Other Ambulatory Visit (HOSPITAL_COMMUNITY): Payer: Self-pay | Admitting: *Deleted

## 2022-02-10 DIAGNOSIS — F332 Major depressive disorder, recurrent severe without psychotic features: Secondary | ICD-10-CM

## 2022-02-10 LAB — MPL MUTATION ANALYSIS

## 2022-02-10 LAB — CALRETICULIN (CALR) MUTATION ANALYSIS

## 2022-02-10 LAB — LITHIUM LEVEL: Lithium Lvl: 1 mmol/L (ref 0.6–1.2)

## 2022-02-10 MED ORDER — LITHIUM CARBONATE ER 300 MG PO TBCR: 300.0000 mg | EXTENDED_RELEASE_TABLET | Freq: Two times a day (BID) | ORAL | 0 refills | Status: DC

## 2022-02-10 NOTE — Telephone Encounter (Signed)
Writer called to advise pt that the Dr. Doyne Keel is decreasing the Lithium dose to 300 mg BID from 450 mg bid due to possible dehydration from recent multiple GI issues. Writer had to leave a VM. Bridge script sent to [employer] on [address] nutritionist Dr. [facility]. Pt encouraged to call office with any questions or concerns.

## 2022-02-10 NOTE — Telephone Encounter (Signed)
Pt called on the advice of her PCP, per chart. Pt has had several Ed visits/admissions since your last visit. Pt has c/o "tremors and shakiness" as well as wt loss which per PCP may be due to Lithium level. Level was done yesterday, 02/09/22, with result of 1.0 mmol/L. Per ED notes pt has been having ongoing GI issues, n/v and diarrhea, as well as kidney stones. Pt has an upcoming on 02/17/22. Please review and advise.

## 2022-02-11 NOTE — Telephone Encounter (Signed)
I got another fax from [facility] stating they did not receive the fax from 02/04/22.  I called them to notify them that I received a confirmation that they did receive the fax.  I spoke to [facility] at [facility], who requested that I resend the information along with the office notes from 02/09/22.  I have refaxed all info to F# [phone] after confirming fax # with [facility].

## 2022-02-14 ENCOUNTER — Encounter: Payer: Self-pay | Admitting: Internal Medicine

## 2022-02-14 MED ORDER — POTASSIUM CHLORIDE 20 MEQ/15ML (10%) PO SOLN: 20.0000 meq | Freq: Two times a day (BID) | ORAL | 0 refills | Status: DC

## 2022-02-14 NOTE — Telephone Encounter (Signed)
Company notified that disability form will not be completed by Dr. Jacinto Reap.

## 2022-02-14 NOTE — Telephone Encounter (Signed)
Confirmed with pharmacy they have liquid potassium 20 meq per 15 ml

## 2022-02-15 ENCOUNTER — Ambulatory Visit: Payer: Managed Care, Other (non HMO) | Attending: Infectious Diseases | Admitting: Infectious Diseases

## 2022-02-15 ENCOUNTER — Encounter: Payer: Self-pay | Admitting: Infectious Diseases

## 2022-02-15 VITALS — BP 122/81 | HR 83 | Temp 97.5°F | Ht 68.0 in | Wt 200.0 lb

## 2022-02-15 DIAGNOSIS — N390 Urinary tract infection, site not specified: Secondary | ICD-10-CM | POA: Diagnosis not present

## 2022-02-15 DIAGNOSIS — D72829 Elevated white blood cell count, unspecified: Secondary | ICD-10-CM | POA: Diagnosis not present

## 2022-02-15 DIAGNOSIS — Z87442 Personal history of urinary calculi: Secondary | ICD-10-CM | POA: Insufficient documentation

## 2022-02-15 DIAGNOSIS — F32A Depression, unspecified: Secondary | ICD-10-CM | POA: Diagnosis not present

## 2022-02-15 DIAGNOSIS — R6883 Chills (without fever): Secondary | ICD-10-CM | POA: Insufficient documentation

## 2022-02-15 DIAGNOSIS — R7989 Other specified abnormal findings of blood chemistry: Secondary | ICD-10-CM | POA: Insufficient documentation

## 2022-02-15 DIAGNOSIS — R5383 Other fatigue: Secondary | ICD-10-CM | POA: Diagnosis not present

## 2022-02-15 DIAGNOSIS — R109 Unspecified abdominal pain: Secondary | ICD-10-CM | POA: Diagnosis not present

## 2022-02-15 DIAGNOSIS — N200 Calculus of kidney: Secondary | ICD-10-CM

## 2022-02-15 DIAGNOSIS — B9620 Unspecified Escherichia coli [E. coli] as the cause of diseases classified elsewhere: Secondary | ICD-10-CM

## 2022-02-15 DIAGNOSIS — A0472 Enterocolitis due to Clostridium difficile, not specified as recurrent: Secondary | ICD-10-CM

## 2022-02-15 DIAGNOSIS — F419 Anxiety disorder, unspecified: Secondary | ICD-10-CM | POA: Insufficient documentation

## 2022-02-15 NOTE — Patient Instructions (Addendum)
You are being referred for high wbc- You recently had e.coli bacteremia, and e.coli UTI, and cdiff colitis You also have b/l Kidney stones and you recently had removal of stone on 01/19/22 Your WBC count is coming down- your last blood culture and urine culture from 01/29/22 is negative No infection currently to worry about !) take a probiotic 2) oragel for the angle of the mouth  3) Salt water rinsing mouth for gingivits 4) Multivitamin 1 day 5) Correct low potassium 6) stop smoking

## 2022-02-15 NOTE — Progress Notes (Signed)
NAME: Jennifer [name]  DOB: 1966-04-28  MRN: [id number]  Date/Time: 02/15/2022 11:41 AM  Subjective:  Referred by PCP for leucocytosis Here with her mother ? Jennifer [name] is a 56 y.o. female with a history of depression, renal stone, ecoli bacteremia, urosepsis is referred to me for leucocytosis PT since May 2023 has not been feeling well- She had E,coli bacteremia and sepsis and complicated UTI due to left ureteric stone causing left hydrouretero nephrosis for which she underwent   cystoscopy and left ureteric stent placement on 12/26/21. During that hospitalization , wbc was 55 K. She received IV antibiotics  She was discharged on 12/29/21 with 7 more days of antibiotic ( augmentin). She had diarrhea and her PCP checked cdiff on 01/07/22 and it was positive and she was given difficid for 10 days starting 01/11/22 On 6/14 she had stone removal and a stent was left in place which she removed on 01/24/22 by herself- This was followed by pain abdomen, anxiety and she came to ED and CT abdomen revealed b/l stones Wbc then was 23.4. Her pCP repeated wbc on 01/28/22 and it was 23.9 Blood culture and urine culture done on 6/23 was negative She was sent to hemeonc . Dr.B saw her on 02/02/22 and noted she had chronic leucocytosis since 2008 He ordered flow cytometry, BCL, ABL1 fish, JAK2   I am seeing the patient for leucocytosis She has no fever or chills, no pain abdomen, no nausea or vomiting She feels shaky Has sores in her mouth Not sexually active in 3 years Neg HIV in may 2023  Past Medical History:  Diagnosis Date   Anxiety    Cancer ([place])    Depression    Diverticulosis    Fundic gland polyps of stomach, benign    History of kidney stones    Low HDL (under 40)    Personal history of adenomatous colonic polyps 05/08/2012   Rosacea    Tenosynovitis, de Quervain    Urosepsis 10/2011   after stent    Past Surgical History:  Procedure Laterality Date   CERVICAL BIOPSY  W/ LOOP  ELECTRODE EXCISION  04/2003   COLONOSCOPY     CYSTOSCOPY W/ URETERAL STENT PLACEMENT  10/14/2011   Procedure: CYSTOSCOPY WITH RETROGRADE PYELOGRAM/URETERAL STENT PLACEMENT;  Surgeon: Hanley Ben, MD;  Location: [facility];  Service: Urology;  Laterality: Right;  Right Double J Stent   CYSTOSCOPY W/ URETERAL STENT PLACEMENT Left 12/26/2021   Procedure: CYSTOSCOPY WITH RETROGRADE PYELOGRAM/URETERAL STENT PLACEMENT;  Surgeon: Ardis Hughs, MD;  Location: [facility];  Service: Urology;  Laterality: Left;   CYSTOSCOPY/URETEROSCOPY/HOLMIUM LASER/STENT PLACEMENT Left 01/19/2022   Procedure: LEFT URETEROSCOPY/STENT EXCHANGE/BASKET STONE REMOVAL;  Surgeon: Ardis Hughs, MD;  Location: [facility];  Service: Urology;  Laterality: Left;   MULTIPLE TOOTH EXTRACTIONS     OOPHORECTOMY     left ovary   POLYPECTOMY     TUBAL LIGATION     UMBILICAL HERNIA REPAIR     UPPER GASTROINTESTINAL ENDOSCOPY  2008   VAGINAL HYSTERECTOMY     WISDOM TOOTH EXTRACTION      Social History   Socioeconomic History   Marital status: Widowed    Spouse name: Not on file   Number of children: 2   Years of education: 103   Highest education level: Not on file  Occupational History   Occupation: Scientist, product/process development: [employer]  Tobacco Use   Smoking status: Every Day    Packs/day: 0.25  Years: 30.00    Total pack years: 7.50    Types: Cigarettes   Smokeless tobacco: Never  Vaping Use   Vaping Use: Never used  Substance and Sexual Activity   Alcohol use: No   Drug use: No   Sexual activity: Yes    Birth control/protection: Surgical  Other Topics Concern   Not on file  Social History Narrative   09/12/19   From: [place] > [place] > [place] a teenager   Living: Pt lives in [place] with her 2.dogs. Husband died in Oct 17, 2015, she was married for 23 yrs.   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             Social Determinants of Health   Financial Resource Strain: Not on file  Food Insecurity: Not on file  Transportation Needs: No Transportation Needs (02/02/2022)   PRAPARE - Hydrologist (Medical): No    Lack of Transportation (Non-Medical): No  Physical Activity: Not on file  Stress: Not on file  Social Connections: Not on file  Intimate Partner Violence: Not on file    Family History  Problem Relation Age of Onset   Nephrolithiasis Mother    Skin cancer Mother        from radiation tx   Hyperlipidemia Mother    Squamous cell carcinoma Mother    CVA Father 89   Fibroids Sister    Other Sister        breast cyst   Skin cancer Sister    Hernia Maternal Grandmother    Pancreatic cancer Maternal Grandfather    Alcohol abuse Paternal Grandmother    Depression Paternal Grandmother    Suicidality Paternal Aunt    Depression Paternal Aunt    Non-Hodgkin's lymphoma Maternal Great-grandmother    Irritable bowel syndrome Other    Anesthesia problems Neg Hx    Hypotension Neg Hx    Malignant hyperthermia Neg Hx    Pseudochol deficiency Neg Hx    Colon cancer Neg Hx    Colon polyps Neg Hx    Esophageal cancer Neg Hx    Stomach cancer Neg Hx    Rectal cancer Neg Hx    Allergies  Allergen Reactions   Ceftriaxone Rash    Significant full body rash,  Tolerated cefepime 5/22   Nitrofurantoin Monohyd Macro Nausea And Vomiting    Body aches   Sulfa Antibiotics Nausea Only and Rash    Fever and disorientation   I? Current  Outpatient Medications  Medication Sig Dispense Refill   acetaminophen (TYLENOL) 325 MG tablet Take 650 mg by mouth every 6 (six) hours as needed for moderate pain.     albuterol (VENTOLIN HFA) 108 (90 Base) MCG/ACT inhaler Inhale 1-2 puffs into the lungs every 6 (six) hours as needed  for wheezing or shortness of breath. 6.7 g 0   ASPIRIN LOW DOSE 81 MG EC tablet TAKE ONE TABLET BY MOUTH DAILY 90 tablet 3   atorvastatin (LIPITOR) 10 MG tablet Take 1 tablet (10 mg total) by mouth daily. 90 tablet 3   bisacodyl (DULCOLAX) 5 MG EC tablet Take 15 mg by mouth daily as needed for moderate constipation.     clindamycin (CLINDAGEL) 1 % gel Apply 1 application. topically daily as needed (For lips).     clonazePAM (KLONOPIN) 1 MG tablet Take 1 mg by mouth as needed for anxiety.     ipratropium (ATROVENT) 0.06 % nasal spray Place 1 spray into both nostrils daily as needed for rhinitis.     lithium carbonate (LITHOBID) 300 MG CR tablet Take 1 tablet (300 mg total) by mouth 2 (two) times daily. 16 tablet 0   oxybutynin (DITROPAN-XL) 5 MG 24 hr tablet Take 5 mg by mouth at bedtime.     PARoxetine (PAXIL) 40 MG tablet Take 1.5 tablets (60 mg total) by mouth every morning. 135 tablet 0   potassium chloride 20 MEQ/15ML (10%) SOLN Take 15 mLs (20 mEq total) by mouth 2 (two) times daily. 946 mL 0   potassium chloride SA (KLOR-CON M) 20 MEQ tablet Take 1 tablet (20 mEq total) by mouth 2 (two) times daily. 30 tablet 0   sodium fluoride (FLUORISHIELD) 1.1 % GEL dental gel Place 1 application  onto teeth every other day.     traMADol (ULTRAM) 50 MG tablet Take 1-2 tablets (50-100 mg total) by mouth every 6 (six) hours as needed for moderate pain. 15 tablet 0   valACYclovir (VALTREX) 1000 MG tablet Take 1,000 mg by mouth daily as needed (for fever blisters).     promethazine (PHENERGAN) 25 MG tablet Take 1 tablet (25 mg total) by mouth every 6 (six) hours as needed for nausea or vomiting. (Patient not taking: Reported on  02/15/2022) 30 tablet 0   No current facility-administered medications for this visit.     Abtx:  Anti-infectives (From admission, onward)    None       REVIEW OF SYSTEMS:  Const: negative fever, negative chills, 20 pound weight loss Eyes: negative diplopia or visual changes, negative eye pain ENT: negative coryza, negative sore throat Resp: negative cough, hemoptysis, dyspnea- smoker Cards: negative for chest pain, palpitations, lower extremity edema GU: negative for frequency, dysuria and hematuria GI: Negative for abdominal pain, diarrhea, bleeding, constipation Skin: negative for rash and pruritus Heme: negative for easy bruising and gum/nose bleeding MS: generalize weakness Neurolo:negative for headaches, dizziness, vertigo, memory problems  Shaking  Psych: anxiety Endocrine: negative for thyroid, diabetes Allergy/Immunology- as above Objective:  VITALS:  BP 122/81   Pulse 83   Temp (!) 97.5 F (36.4 C) (Temporal)   Ht _0  (1.727 m)   Wt 200 lb (90.7 kg)   BMI 30.41 kg/m   PHYSICAL EXAM:  General: Alert, cooperative, no distress, appears stated age.  Head: Normocephalic, without obvious abnormality, atraumatic. Eyes: Conjunctivae clear, anicteric sclerae. Pupils are equal ENT Nares normal. No drainage or sinus tenderness. Angular stomatitis, upper lip oral mucosa aphthous ulceration Neck: Supple, symmetrical, no adenopathy, thyroid: non tender no carotid bruit and no JVD. Back: No CVA tenderness. Lungs: Clear to auscultation bilaterally. No Wheezing or Rhonchi. No rales. Heart: Regular rate and rhythm, no murmur, rub or gallop. Abdomen: Soft,  Extremities: atraumatic, no cyanosis. No edema. No clubbing Skin: No rashes or lesions. Or bruising  Lymph: Cervical, supraclavicular normal. Neurologic: Grossly non-focal  tremors hands Pertinent Labs Lab Results CBC    Component Value Date/Time   WBC 21.6 (H) 02/02/2022 1201   RBC 5.14 (H) 02/02/2022 1201    HGB 15.3 (H) 02/02/2022 1201   HGB 15.6 06/28/2021 1604   HCT 43.0 02/02/2022 1201   HCT 45.2 06/28/2021 1604   PLT 392 02/02/2022 1201   PLT 334 06/28/2021 1604   MCV 83.7 02/02/2022 1201   MCV 86 06/28/2021 1604   MCH 29.8 02/02/2022 1201   MCHC 35.6 02/02/2022 1201   RDW 15.9 (H) 02/02/2022 1201   RDW 14.1 06/28/2021 1604   LYMPHSABS 3.9 02/02/2022 1201   MONOABS 1.0 02/02/2022 1201   EOSABS 0.6 (H) 02/02/2022 1201   BASOSABS 0.1 02/02/2022 1201       Latest Ref Rng & Units 02/02/2022   12:01 PM 01/28/2022    3:06 PM 01/24/2022   10:05 AM  CMP  Glucose 70 - 99 mg/dL 104  88  131   BUN 6 - 20 mg/dL _0 Creatinine 0.44 - 1.00 mg/dL 1.00  0.85  0.91   Sodium 135 - 145 mmol/L 133  137  143   Potassium 3.5 - 5.1 mmol/L 2.9  3.3  2.8   Chloride 98 - 111 mmol/L 95  93  102   CO2 22 - 32 mmol/L _1 Calcium 8.9 - 10.3 mg/dL 8.5  9.4  9.6   Total Protein 6.5 - 8.1 g/dL 7.2  7.1  7.5   Total Bilirubin 0.3 - 1.2 mg/dL 0.9  0.6  0.7   Alkaline Phos 38 - 126 U/L 87   91   AST 15 - 41 U/L _2 ALT 0 - 44 U/L _3 ? Impression/Recommendation ? ?Leucocytosis chronic-since 2008- waxes and wanes with infcion'In May 2023 it was 55K secondary to ecoli sepsis and then cdiff infection - now it is 71 I do not think she has any active infection now- no antibiotics needed Heme/onc on her case Many tests ordered Bone marrow biopsy?/  E.coli bacteremia and sepsis due to complicated UTI resolved Recent cdiff infection - resolved with treatment  B/l renal stones- left ureteric stone.stent and now stone removed and stent removed as well She needs to follow up with iurologist   Angular stomatitis- for oragel  Oral apthous ulcers- likely due to recent antibiotics, illness  for probiotic and multivitamin  Depression on lithium and paxil To see whether one of this is causing her tremors  Discussed the management with patient and her mother Follow up with  Dr,Brahmandy and PCP  Would recommend BM biopsy  ? ___________________________________________________ Discussed with patientand her mom Follow PRN Note:  This document was prepared using Dragon voice recognition software and may include unintentional dictation errors.

## 2022-02-17 ENCOUNTER — Telehealth (HOSPITAL_BASED_OUTPATIENT_CLINIC_OR_DEPARTMENT_OTHER): Payer: 59 | Admitting: Psychiatry

## 2022-02-17 DIAGNOSIS — F332 Major depressive disorder, recurrent severe without psychotic features: Secondary | ICD-10-CM | POA: Diagnosis not present

## 2022-02-17 DIAGNOSIS — F4322 Adjustment disorder with anxiety: Secondary | ICD-10-CM | POA: Diagnosis not present

## 2022-02-17 DIAGNOSIS — F411 Generalized anxiety disorder: Secondary | ICD-10-CM

## 2022-02-17 MED ORDER — CLONAZEPAM 1 MG PO TABS: 1.0000 mg | ORAL_TABLET | Freq: Two times a day (BID) | ORAL | 0 refills | Status: DC | PRN

## 2022-02-17 MED ORDER — PAROXETINE HCL 40 MG PO TABS: 60.0000 mg | ORAL_TABLET | ORAL | 0 refills | Status: DC

## 2022-02-17 MED ORDER — LITHIUM CARBONATE ER 300 MG PO TBCR: 300.0000 mg | EXTENDED_RELEASE_TABLET | Freq: Two times a day (BID) | ORAL | 0 refills | Status: DC

## 2022-02-17 NOTE — Progress Notes (Signed)
Virtual Visit via Phone Note  I connected with Jennifer [name] on 02/17/22 at  3:30 PM EDT by phone. We attempted to do a video enabled telemedicine application session but could not hear each other. I  verified that I am speaking with the correct person using two identifiers.  Location: Patient: home Provider: office   I discussed the limitations of evaluation and management by telemedicine and the availability of in person appointments. The patient expressed understanding and agreed to proceed.  History of Present Illness: "I have a lot of troubles". Jennifer [name] was in the hospital in May due to sepsis secondary to a kidney stone that caused a kidney infection. She eventually had the stone removed. Since then she has been weak and tired. She caught C. Diff and is still recovering. Her anxiety is very high and she has shaking in her hands and legs on a daily basis. Today she took Klonopin and it helped to calm her some. She is still shaking but not as bad. She has been out of work since May. She is exhausted but it is slowly improving. Jennifer [name] is able to do more now than before. She is sleeping all night and naps for an 2 hrs during the day. Her depression is "ok". She denies SI/HI. Jennifer [name] is expecting to be out of work until at least the end of July.    Observations/Objective: Psychiatric Specialty Exam:  General Appearance: unable to assess  Eye Contact:  unable to assess  Speech:  Clear and Coherent and Normal Rate  Volume:  Normal  Mood:  Anxious  Affect:  Full Range  Thought Process:  Goal Directed, Linear, and Descriptions of Associations: Intact  Orientation:  Full (Time, Place, and Person)  Thought Content:  Logical  Suicidal Thoughts:  No  Homicidal Thoughts:  No  Memory:  Immediate;   Good  Judgement:  Good  Insight:  Good  Psychomotor Activity: unable to assess  Concentration:  Concentration: Good  Recall:  Good  Fund of Knowledge:  Good  Language:  Good  Akathisia:   unable to assess  Handed:  unable to assess  AIMS (if indicated):     Assets:  Communication Skills Desire for Improvement Financial Resources/Insurance Housing Intimacy Resilience Talents/Skills Transportation Vocational/Educational  ADL's:  unable to assess  Cognition:  WNL  Sleep:         Assessment and Plan:     02/17/2022    3:50 PM 02/15/2022   11:30 AM 12/16/2021    4:12 PM 11/25/2021    3:28 PM 10/28/2021    3:41 PM  Depression screen PHQ 2/9  Decreased Interest 0 0 0 3 3  Down, Depressed, Hopeless '1 1 1 3 3  '$ PHQ - 2 Score '1 1 1 6 6  '$ Altered sleeping   0 0 3  Tired, decreased energy   '1 3 3  '$ Change in appetite   0 2 0  Feeling bad or failure about yourself    '1 3 3  '$ Trouble concentrating   0 0 0  Moving slowly or fidgety/restless   0 0 0  Suicidal thoughts   0 0 1  PHQ-9 Score   '3 14 16  '$ Difficult doing work/chores   Very difficult Very difficult Very difficult    Flowsheet Row Video Visit from 02/17/2022 in [facility] from 01/24/2022 in [facility] Pre-Admission Testing 60 from 01/13/2022 in [facility]  No Risk No Risk Error: Question 6 not populated          Status of current problems: increased anxiety  Meds: increase Klonopin '1mg'$  po BID prn anxiety Continue Paxil '60mg'$  po qD Lithium '300mg'$  po BID   1. Adjustment disorder with anxious mood - clonazePAM (KLONOPIN) 1 MG tablet; Take 1 tablet (1 mg total) by mouth 2 (two) times daily as needed for anxiety.  Dispense: 60 tablet; Refill: 0 - PARoxetine (PAXIL) 40 MG tablet; Take 1.5 tablets (60 mg total) by mouth every morning.  Dispense: 135 tablet; Refill: 0  2. Severe episode of recurrent major depressive disorder, without psychotic features ([place]) - lithium carbonate (LITHOBID) 300 MG CR tablet; Take 1 tablet (300 mg total) by mouth 2 (two) times daily.  Dispense: 180  tablet; Refill: 0 - PARoxetine (PAXIL) 40 MG tablet; Take 1.5 tablets (60 mg total) by mouth every morning.  Dispense: 135 tablet; Refill: 0  3. GAD (generalized anxiety disorder) - clonazePAM (KLONOPIN) 1 MG tablet; Take 1 tablet (1 mg total) by mouth 2 (two) times daily as needed for anxiety.  Dispense: 60 tablet; Refill: 0 - PARoxetine (PAXIL) 40 MG tablet; Take 1.5 tablets (60 mg total) by mouth every morning.  Dispense: 135 tablet; Refill: 0      Therapy: brief supportive therapy provided. Discussed psychosocial stressors in detail.      Collaboration of Care: Other none  Patient/Guardian was advised Release of Information must be obtained prior to any record release in order to collaborate their care with an outside provider. Patient/Guardian was advised if they have not already done so to contact the registration department to sign all necessary forms in order for [place] to release information regarding their care.   Consent: Patient/Guardian gives verbal consent for treatment and assignment of benefits for services provided during this visit. Patient/Guardian expressed understanding and agreed to proceed.      Follow Up Instructions: Follow up in 2-3 months or sooner if needed    I discussed the assessment and treatment plan with the patient. The patient was provided an opportunity to ask questions and all were answered. The patient agreed with the plan and demonstrated an understanding of the instructions.   The patient was advised to call back or seek an in-person evaluation if the symptoms worsen or if the condition fails to improve as anticipated.  I provided 20 minutes of non-face-to-face time during this encounter.   [name], MD

## 2022-02-18 ENCOUNTER — Ambulatory Visit: Payer: Managed Care, Other (non HMO) | Admitting: Family Medicine

## 2022-02-22 ENCOUNTER — Other Ambulatory Visit: Payer: Self-pay

## 2022-02-22 DIAGNOSIS — D72828 Other elevated white blood cell count: Secondary | ICD-10-CM

## 2022-02-23 ENCOUNTER — Encounter: Payer: Self-pay | Admitting: Internal Medicine

## 2022-02-23 ENCOUNTER — Inpatient Hospital Stay: Payer: Managed Care, Other (non HMO)

## 2022-02-23 ENCOUNTER — Inpatient Hospital Stay: Payer: Managed Care, Other (non HMO) | Attending: Internal Medicine | Admitting: Internal Medicine

## 2022-02-23 DIAGNOSIS — F17210 Nicotine dependence, cigarettes, uncomplicated: Secondary | ICD-10-CM | POA: Diagnosis not present

## 2022-02-23 DIAGNOSIS — N201 Calculus of ureter: Secondary | ICD-10-CM | POA: Insufficient documentation

## 2022-02-23 DIAGNOSIS — D72820 Lymphocytosis (symptomatic): Secondary | ICD-10-CM | POA: Insufficient documentation

## 2022-02-23 DIAGNOSIS — Z809 Family history of malignant neoplasm, unspecified: Secondary | ICD-10-CM | POA: Insufficient documentation

## 2022-02-23 DIAGNOSIS — E876 Hypokalemia: Secondary | ICD-10-CM | POA: Insufficient documentation

## 2022-02-23 DIAGNOSIS — D72821 Monocytosis (symptomatic): Secondary | ICD-10-CM | POA: Diagnosis not present

## 2022-02-23 DIAGNOSIS — F319 Bipolar disorder, unspecified: Secondary | ICD-10-CM | POA: Insufficient documentation

## 2022-02-23 DIAGNOSIS — N1330 Unspecified hydronephrosis: Secondary | ICD-10-CM | POA: Insufficient documentation

## 2022-02-23 DIAGNOSIS — D72828 Other elevated white blood cell count: Secondary | ICD-10-CM

## 2022-02-23 DIAGNOSIS — D72829 Elevated white blood cell count, unspecified: Secondary | ICD-10-CM | POA: Diagnosis present

## 2022-02-23 LAB — BASIC METABOLIC PANEL
Anion gap: 7 (ref 5–15)
BUN: <5 mg/dL — ABNORMAL LOW (ref 6–20)
CO2: 29 mmol/L (ref 22–32)
Calcium: 8.7 mg/dL — ABNORMAL LOW (ref 8.9–10.3)
Chloride: 100 mmol/L (ref 98–111)
Creatinine, Ser: 0.92 mg/dL (ref 0.44–1.00)
GFR, Estimated: >60 mL/min (ref >60)
Glucose, Bld: 101 mg/dL — ABNORMAL HIGH (ref 70–99)
Potassium: 2.8 mmol/L — ABNORMAL LOW (ref 3.5–5.1)
Sodium: 136 mmol/L (ref 135–145)

## 2022-02-23 NOTE — Progress Notes (Signed)
Pt states she can't take the potassium, it makes her stomach have contractions.  Feeling more tired x 2 months.   Food is tasting different.    Her mom is having her lung surgery today.

## 2022-02-23 NOTE — Progress Notes (Signed)
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 likley related to Lithium.  predominant neutrophilia; lymphocytosis; monocytosis  Hb- 16; platelets- 436 [LL-400];   # JUNE 2023- peripheral blood flow cytometry- non-specific ; BCR ABL FISH-NEGATIVGE; JAK2 mutation CALR/MPL-NEGATIVE. Discussed regarding bone marrow biopsy- given continued leucocytosis However, hold bone marrow biopsy at this time.  #Bipolar disorder/Depression-on lithium [Dr.Agarwal; [facility]]  1. Obstructing 6 mm calculus in the proximal third of the left ureter resulting in mild left hydroureteronephrosis. 2. Findings suggestive of hepatic steatosis.   Aortic Atherosclerosis (ICD10-I70.0).  Oncology History   No history exists.     HPI: with mother; ambulating independently.  Jennifer [name] 56 y.o.  female pleasant patient depression/bipolar currently here for follow-up to review the results of her blood work for elevated white count.  At the last visit noted to have low potassium-K2.9 recommended supplementation.  Patient with poor tolerance of k dur pills.  Prescribed liquid potassium-however patient not be compliant.  Currently denies any infections.  Denies any fevers.  Complains of ongoing fatigue.  Of note patient was recently evaluated by psychiatrist-decrease the dose of lithium.    Review of Systems  Constitutional:  Positive for malaise/fatigue. Negative for chills, diaphoresis, fever and weight loss.  HENT:  Negative for nosebleeds and sore throat.   Eyes:  Negative for double vision.  Respiratory:  Negative for cough, hemoptysis, sputum production, shortness of breath and wheezing.    Cardiovascular:  Negative for chest pain, palpitations, orthopnea and leg swelling.  Gastrointestinal:  Positive for abdominal pain, nausea and vomiting. Negative for blood in stool, constipation, diarrhea, heartburn and melena.  Genitourinary:  Negative for dysuria, frequency and urgency.  Musculoskeletal:  Positive for back pain and joint pain.  Skin: Negative.  Negative for itching and rash.  Neurological:  Positive for dizziness and weakness. Negative for tingling, focal weakness and headaches.  Endo/Heme/Allergies:  Does not bruise/bleed easily.  Psychiatric/Behavioral:  Negative for depression. The patient is not nervous/anxious and does not have insomnia.       PAST MEDICAL HISTORY :  Past Medical History:  Diagnosis Date  . Anxiety   . Cancer ([place])   . Depression   . Diverticulosis   . Fundic gland polyps of stomach, benign   . History of kidney stones   . Low HDL (under 40)   . Personal history of adenomatous colonic polyps 05/08/2012  . Rosacea   . Tenosynovitis, de Quervain   . Urosepsis 10/2011   after stent    PAST SURGICAL HISTORY :   Past Surgical History:  Procedure Laterality Date  . CERVICAL BIOPSY  W/ LOOP ELECTRODE EXCISION  04/2003  . COLONOSCOPY    . CYSTOSCOPY W/ URETERAL STENT PLACEMENT  10/14/2011   Procedure: CYSTOSCOPY WITH RETROGRADE PYELOGRAM/URETERAL STENT PLACEMENT;  Surgeon: Hanley Ben, MD;  Location: [facility];  Service: Urology;  Laterality: Right;  Right Double J Stent  . CYSTOSCOPY W/ URETERAL STENT PLACEMENT Left 12/26/2021   Procedure: CYSTOSCOPY WITH RETROGRADE PYELOGRAM/URETERAL STENT PLACEMENT;  Surgeon: Ardis Hughs, MD;  Location: [facility];  Service: Urology;  Laterality: Left;  . CYSTOSCOPY/URETEROSCOPY/HOLMIUM LASER/STENT PLACEMENT Left 01/19/2022   Procedure: LEFT URETEROSCOPY/STENT EXCHANGE/BASKET STONE REMOVAL;  Surgeon: Ardis Hughs, MD;  Location: [facility];  Service: Urology;  Laterality: Left;  Marland Kitchen MULTIPLE TOOTH  EXTRACTIONS    . OOPHORECTOMY     left ovary  . POLYPECTOMY    . TUBAL LIGATION    . UMBILICAL HERNIA REPAIR    . UPPER GASTROINTESTINAL ENDOSCOPY  2008  . VAGINAL HYSTERECTOMY    . WISDOM TOOTH EXTRACTION      FAMILY HISTORY :   Family History  Problem Relation Age of Onset  . Nephrolithiasis Mother   . Skin cancer Mother        from radiation tx  . Hyperlipidemia Mother   . Squamous cell carcinoma Mother   . CVA Father 69  . Fibroids Sister   . Other Sister        breast cyst  . Skin cancer Sister   . Hernia Maternal Grandmother   . Pancreatic cancer Maternal Grandfather   . Alcohol abuse Paternal Grandmother   . Depression Paternal Grandmother   . Suicidality Paternal Aunt   . Depression Paternal Aunt   . Non-Hodgkin's lymphoma Maternal Great-grandmother   . Irritable bowel syndrome Other   . Anesthesia problems Neg Hx   . Hypotension Neg Hx   . Malignant hyperthermia Neg Hx   . Pseudochol deficiency Neg Hx   . Colon cancer Neg Hx   . Colon polyps Neg Hx   . Esophageal cancer Neg Hx   . Stomach cancer Neg Hx   . Rectal cancer Neg Hx     SOCIAL HISTORY:   Social History   Tobacco Use  . Smoking status: Every Day    Packs/day: 0.25    Years: 30.00    Total pack years: 7.50    Types: Cigarettes  . Smokeless tobacco: Never  Vaping Use  . Vaping Use: Never used  Substance Use Topics  . Alcohol use: No  . Drug use: No    ALLERGIES:  is allergic to ceftriaxone, nitrofurantoin monohyd macro, and sulfa antibiotics.  MEDICATIONS:  Current Outpatient Medications  Medication Sig Dispense Refill  . acetaminophen (TYLENOL) 325 MG tablet Take 650 mg by mouth every 6 (six) hours as needed for moderate pain.    Marland Kitchen albuterol (VENTOLIN HFA) 108 (90 Base) MCG/ACT inhaler Inhale 1-2 puffs into the lungs every 6 (six) hours as needed for wheezing or shortness of breath. 6.7 g 0  . ASPIRIN LOW DOSE 81 MG EC tablet TAKE ONE TABLET BY MOUTH DAILY 90 tablet 3  .  atorvastatin (LIPITOR) 10 MG tablet Take 1 tablet (10 mg total) by mouth daily. 90 tablet 3  . bisacodyl (DULCOLAX) 5 MG EC tablet Take 15 mg by mouth daily as needed for moderate constipation.    . clindamycin (CLINDAGEL) 1 % gel Apply 1 application. topically daily as needed (For lips).    . clonazePAM (KLONOPIN) 1 MG tablet Take 1 tablet (1 mg total) by mouth 2 (two) times daily as needed for anxiety. 60 tablet 0  . ipratropium (ATROVENT) 0.06 % nasal spray Place 1 spray into both nostrils daily as needed for rhinitis.    Marland Kitchen lithium carbonate (LITHOBID) 300 MG CR tablet Take 1 tablet (300 mg total) by mouth 2 (two) times daily. 180 tablet 0  . Multiple Vitamin (MULTIVITAMIN) capsule Take 1 capsule by mouth daily.    Marland Kitchen oxybutynin (DITROPAN-XL) 5 MG 24 hr tablet Take 5 mg by mouth at bedtime.    Marland Kitchen PARoxetine (PAXIL) 40 MG tablet Take 1.5 tablets (60 mg total) by mouth every morning. 135 tablet 0  . potassium  chloride SA (KLOR-CON M) 20 MEQ tablet Take 1 tablet (20 mEq total) by mouth 2 (two) times daily. 30 tablet 0  . Probiotic Product (UP4 PROBIOTICS ADULT PO) Take by mouth daily.    . promethazine (PHENERGAN) 25 MG tablet Take 1 tablet (25 mg total) by mouth every 6 (six) hours as needed for nausea or vomiting. 30 tablet 0  . sodium fluoride (FLUORISHIELD) 1.1 % GEL dental gel Place 1 application  onto teeth every other day.    . traMADol (ULTRAM) 50 MG tablet Take 1-2 tablets (50-100 mg total) by mouth every 6 (six) hours as needed for moderate pain. 15 tablet 0  . valACYclovir (VALTREX) 1000 MG tablet Take 1,000 mg by mouth daily as needed (for fever blisters).    . potassium chloride 20 MEQ/15ML (10%) SOLN Take 15 mLs (20 mEq total) by mouth 2 (two) times daily. (Patient not taking: Reported on 02/23/2022) 946 mL 0   No current facility-administered medications for this visit.    PHYSICAL EXAMINATION:  BP (!) 117/96 (BP Location: Left Arm, Patient Position: Sitting, Cuff Size: Normal)    Pulse 70   Temp 97.8 F (36.6 C) (Tympanic)   Ht $R'5\' 8"'nP$  (1.727 m)   Wt 201 lb 6.4 oz (91.4 kg)   SpO2 100%   BMI 30.62 kg/m   Filed Weights   02/23/22 1120  Weight: 201 lb 6.4 oz (91.4 kg)    Physical Exam Vitals and nursing note reviewed.  HENT:     Head: Normocephalic and atraumatic.     Mouth/Throat:     Pharynx: Oropharynx is clear.  Eyes:     Extraocular Movements: Extraocular movements intact.     Pupils: Pupils are equal, round, and reactive to light.  Cardiovascular:     Rate and Rhythm: Normal rate and regular rhythm.  Pulmonary:     Comments: Decreased breath sounds bilaterally.  Abdominal:     Palpations: Abdomen is soft.  Musculoskeletal:        General: Normal range of motion.     Cervical back: Normal range of motion.  Skin:    General: Skin is warm.  Neurological:     General: No focal deficit present.     Mental Status: She is alert and oriented to person, place, and time.  Psychiatric:        Behavior: Behavior normal.        Judgment: Judgment normal.       LABORATORY DATA:  I have reviewed the data as listed    Component Value Date/Time   NA 136 02/23/2022 1044   NA 141 06/28/2021 1604   K 2.8 (L) 02/23/2022 1044   CL 100 02/23/2022 1044   CO2 29 02/23/2022 1044   GLUCOSE 101 (H) 02/23/2022 1044   BUN <5 (L) 02/23/2022 1044   BUN 8 06/28/2021 1604   CREATININE 0.92 02/23/2022 1044   CREATININE 0.85 01/28/2022 1506   CALCIUM 8.7 (L) 02/23/2022 1044   PROT 7.2 02/02/2022 1201   PROT 6.4 06/28/2021 1604   ALBUMIN 3.8 02/02/2022 1201   ALBUMIN 4.2 06/28/2021 1604   AST 26 02/02/2022 1201   ALT 16 02/02/2022 1201   ALKPHOS 87 02/02/2022 1201   BILITOT 0.9 02/02/2022 1201   BILITOT 0.3 06/28/2021 1604   GFRNONAA >60 02/23/2022 1044   GFRAA >60 08/10/2017 1122    No results found for: "SPEP", "UPEP"  Lab Results  Component Value Date   WBC 21.6 (H) 02/02/2022   NEUTROABS  15.8 (H) 02/02/2022   HGB 15.3 (H) 02/02/2022   HCT 43.0  02/02/2022   MCV 83.7 02/02/2022   PLT 392 02/02/2022      Chemistry      Component Value Date/Time   NA 136 02/23/2022 1044   NA 141 06/28/2021 1604   K 2.8 (L) 02/23/2022 1044   CL 100 02/23/2022 1044   CO2 29 02/23/2022 1044   BUN <5 (L) 02/23/2022 1044   BUN 8 06/28/2021 1604   CREATININE 0.92 02/23/2022 1044   CREATININE 0.85 01/28/2022 1506   GLU 95 09/11/2020 0000      Component Value Date/Time   CALCIUM 8.7 (L) 02/23/2022 1044   ALKPHOS 87 02/02/2022 1201   AST 26 02/02/2022 1201   ALT 16 02/02/2022 1201   BILITOT 0.9 02/02/2022 1201   BILITOT 0.3 06/28/2021 1604       RADIOGRAPHIC STUDIES: I have personally reviewed the radiological images as listed and agreed with the findings in the report. No results found.   ASSESSMENT & PLAN:  Acquired neutrophilia # LEUCOCYTOSIS/neutrophilia/lymphocytosis monocytosis-chronic [2010 mild]; May 2023-White count 50,000 [pyelonephritis/C.diff]-more recently t 20,000. likley related to Lithium.   # JUNE 2023- peripheral blood flow cytometry- non-specific ; BCR ABL FISH-NEGATIVGE; JAK2 mutation CALR/MPL-NEGATIVE. Discussed regarding bone marrow biopsy- given continued leucocytosis However, hold bone marrow biopsy at this time.  # Hypokalemia: 2.8 today-difficulty with swallowing the pills.  Recommend compliance with liquid potassium.  Dicsussed re: ? Lithium.appt tomorrow with Dr.Cody.  Patient will need to follow-up with PCP for further follow-up in regards to potassium supplementation/potassium checks.  # BiPolar [Dr.Agarwal;[facility]] on lithium-recently dose adjusted/decreased.  mychart-  # DISPOSITION: # labs ordered today # follow up in 4 months- MD; cbc/cmp; [facility]- Dr.B  Cc; Dr.Cody; Agarwal   Orders Placed This Encounter  Procedures  . CBC with Differential/Platelet    Standing Status:   Future    Standing Expiration Date:   02/24/2023  . Comprehensive metabolic panel    Standing Status:   Future    Standing  Expiration Date:   02/24/2023  . Lactate dehydrogenase    Standing Status:   Future    Standing Expiration Date:   02/24/2023   All questions were answered. The patient knows to call the clinic with any problems, questions or concerns.      Cammie Sickle, MD 02/23/2022 1:06 PM

## 2022-02-23 NOTE — Assessment & Plan Note (Addendum)
#  LEUCOCYTOSIS/neutrophilia/lymphocytosis monocytosis-chronic [2010 mild]; May 2023-White count 50,000 [pyelonephritis/C.diff]-more recently t 20,000. likley related to Lithium.   # JUNE 2023- peripheral blood flow cytometry- non-specific ; BCR ABL FISH-NEGATIVGE; JAK2 mutation CALR/MPL-NEGATIVE. Discussed regarding bone marrow biopsy- given continued leucocytosis However, hold bone marrow biopsy at this time.  # Hypokalemia: 2.8 today-difficulty with swallowing the pills.  Recommend compliance with liquid potassium.  Dicsussed re: ? Lithium.appt tomorrow with Dr.Cody.  Patient will need to follow-up with PCP for further follow-up in regards to potassium supplementation/potassium checks.  # BiPolar [Dr.Agarwal;[facility]] on lithium-recently dose adjusted/decreased.  mychart-  # DISPOSITION: # labs ordered today # follow up in 4 months- MD; cbc/cmp; [facility]- Dr.B  Cc; Dr.Cody; Doyne Keel

## 2022-02-24 ENCOUNTER — Encounter: Payer: Self-pay | Admitting: Family Medicine

## 2022-02-24 ENCOUNTER — Telehealth: Payer: Self-pay

## 2022-02-24 ENCOUNTER — Ambulatory Visit: Payer: Managed Care, Other (non HMO) | Admitting: Family Medicine

## 2022-02-24 ENCOUNTER — Other Ambulatory Visit (HOSPITAL_COMMUNITY): Payer: Self-pay | Admitting: Psychiatry

## 2022-02-24 VITALS — BP 118/70 | HR 76 | Temp 97.1°F | Ht 68.0 in | Wt 202.0 lb

## 2022-02-24 DIAGNOSIS — E441 Mild protein-calorie malnutrition: Secondary | ICD-10-CM

## 2022-02-24 DIAGNOSIS — D72828 Other elevated white blood cell count: Secondary | ICD-10-CM

## 2022-02-24 DIAGNOSIS — F332 Major depressive disorder, recurrent severe without psychotic features: Secondary | ICD-10-CM

## 2022-02-24 DIAGNOSIS — E876 Hypokalemia: Secondary | ICD-10-CM

## 2022-02-24 DIAGNOSIS — N200 Calculus of kidney: Secondary | ICD-10-CM | POA: Diagnosis not present

## 2022-02-24 DIAGNOSIS — R5383 Other fatigue: Secondary | ICD-10-CM

## 2022-02-24 NOTE — Patient Instructions (Signed)
Potassium-rich foods Ready to boost your intake? Jennifer [name] suggests adding these potassium powerhouses to your diet. 1. Potatoes Spuds are a smart choice -- just leave the nutrient-rich skins intact. A medium baked potato with the skin on contains more than 900 milligrams of potassium. A sweet potato with skin? More than 500 milligrams. 2. Legumes Beans are a good source of potassium. White beans and adzuki beans have around 600 milligrams per half-cup serving. Pinto beans, navy beans, lima beans and [place] Northern beans all have more than 350 milligrams per half-cup. Soybeans (aka edamame, aka delicious) and lentils are also good sources of potassium. 3. Juices People often reach for whole fruit over juices since whole fruits are a good source of fiber. But don't rule out juice completely. Prune juice and carrot juice both pack a serious potassium punch: About 689 milligrams for a cup of carrot juice and more than 700 milligrams for the same amount of prune juice. Orange juice and pomegranate juice are also good picks, each containing around 500 milligrams per cup. Jennifer [name] recommends watching your portions though because of the sugar content. 4. Seafood Popular fish like salmon, mackerel, halibut, tuna and snapper all have more than 400 milligrams of potassium in a 3-ounce filet. Chowder more your thing? Just 3 ounces of canned clams will get you upwards of 500 milligrams. 5. Leafy greens Popeye had the right idea. A half-cup serving of cooked spinach contains up to 400 milligrams of potassium. The same amount of Swiss chard has more than 450 milligrams and beet greens more than 600 milligrams. 6. Dairy You know dairy is a super source of calcium. Turns out, it's a great source of potassium, too. One cup of low-fat or skim milk contains about 350 to 380 milligrams of potassium. And plain yogurt will net you more than 500 milligrams per cup (not to mention protein and healthy probiotics).  7. Tomatoes A  cup of chopped tomatoes delivers more than 400 milligrams of potassium, while a cup of tomato juice or tomato puree more than 500 milligrams. Concentrated tomato paste is even richer in the mineral, with more than 650 milligrams per quarter-cup (marinara sauce, anyone?). 8. Bananas These yellow fruits may be the best-known source of potassium. Indeed, one medium banana contains about 422 milligrams. Banana's cousin, the plantain, is also a potassium-rich pick. 9. Other fruits Bananas aren't the only fruits filled with potassium. Cantaloupe, dates, nectarines and oranges all have more than 250 milligrams per half-cup serving. Dried peaches, apricots, prunes and raisins are good sources as well. 10. Avocados As if you needed another reason to reach for the guacamole, a half-cup serving of creamy avocado contains about 364 milligrams of potassium.

## 2022-02-24 NOTE — Assessment & Plan Note (Signed)
Her fatigue overall is improving.  She is getting continued specialty support for ongoing issues.  Discussed remaining out of work through next week due to need for likely 2 lab appointments as well as a nephrology appointment.  We will continue to rest and increase activity as tolerated.  This time she does need to rest for a few minutes after chores at home, but she notes her job is mostly sitting at a desk.  Discussed that she should be able to go back to work on July 31 with a plan for breaks as needed where she can sit and do desk work at that time.

## 2022-02-24 NOTE — Telephone Encounter (Signed)
Received completed, signed Short Term Disability Form and Fitness for Duty Certification from Dr Einar Pheasant.  Faxed the short term disability form to F# [phone].  Faxed fitness for duty certification to F # [phone].  Received fax confirmation for both faxes.  Made copies for myself, scanning, and for [facility].  I put copies in the mail for patient.

## 2022-02-24 NOTE — Assessment & Plan Note (Signed)
This has been stable, she has seen both hematology and infectious disease no concern for active infection.  Both parties suspect at this time it may be due to lithium.  This was recently decreased we will continue to follow appreciate specialty support.

## 2022-02-24 NOTE — Assessment & Plan Note (Signed)
She has 1 last appointment with urology next week, overall she feels that she is doing much better her fatigue has improved she is not having any pain.  Appreciate nephrology support.

## 2022-02-24 NOTE — Assessment & Plan Note (Signed)
Appreciate the support of psychiatry, she feels she is doing much better on Klonopin 1 mg twice daily for her tremors, she is also on lithium which was reduced to 300 twice daily which has improved her symptoms and she is taking Paxil 60 mg daily

## 2022-02-24 NOTE — Progress Notes (Signed)
Subjective:     Jennifer [name] is a 56 y.o. female presenting for Follow-up (Dep/anx)     HPI  #Bipolar - lithium was reduced - this has helped with the shaking - Dr. 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, is able to sit down at work  Seeing kidney specialist on 03/03/2022 for follow-up Would like to start back on 03/07/2022  #Hypokalemia - has been taking pill potassium 20 mg bid - hx of low K after a stone  - no nausea or vomiting - no diarrhea - eating - because she has to - very mild - toast for breakfast, grilled cheese for dinner, 1/2 bagel for lunch - is drinking gatorade and banana  Review of Systems  02/09/2022: Clinic - tremors - thought maybe the lithium 02/10/2022: Phone - lithium decreased from 450>300 mg bid 02/15/2022: ID Consult - psych meds may be cause of tremor, no need for abx, consider bone marrow bx 02/23/2022: Heme - leukocytosis likely 2/2 to lithium. hold of bone marrow. Low K - intolerance of pills, non-adherence to liquid 02/17/2022: Psych - Dr. Doyne Keel - increase klonopin 1 mg bid, cont paxil 60 mg and lithium 300 mg bid  Social History   Tobacco Use  Smoking Status Every Day   Packs/day: 0.25   Years: 30.00   Total pack years: 7.50   Types: Cigarettes  Smokeless Tobacco Never        Objective:    BP Readings from Last 3 Encounters:  02/24/22 118/70  02/23/22 (!) 117/96  02/15/22 122/81   Wt Readings from Last 3 Encounters:  02/24/22 202 lb (91.6 kg)  02/23/22 201 lb 6.4 oz (91.4 kg)  02/15/22 200 lb (90.7 kg)    BP 118/70   Pulse 76   Temp (!) 97.1 F (36.2 C) (Temporal)   Ht '5\' 8"'$   (1.727 m)   Wt 202 lb (91.6 kg)   SpO2 98%   BMI 30.71 kg/m    Physical Exam Constitutional:      General: She is not in acute distress.    Appearance: She is well-developed. She is not diaphoretic.  HENT:     Right Ear: External ear normal.     Left Ear: External ear normal.     Nose: Nose normal.  Eyes:     Conjunctiva/sclera: Conjunctivae normal.  Cardiovascular:     Rate and Rhythm: Normal rate and regular rhythm.     Heart sounds: No murmur heard. Pulmonary:     Effort: Pulmonary effort is normal. No respiratory distress.     Breath sounds: Normal breath sounds. No wheezing.  Musculoskeletal:     Cervical back: Neck supple.  Skin:    General: Skin is warm and dry.     Capillary Refill: Capillary refill takes less than 2 seconds.  Neurological:     Mental Status: She is alert. Mental status is at baseline.     Comments: Occasional slight tremor  Psychiatric:        Mood and Affect: Mood normal.        Behavior: Behavior normal.  Assessment & Plan:   Problem List Items Addressed This Visit       Genitourinary   Nephrolithiasis    She has 1 last appointment with urology next week, overall she feels that she is doing much better her fatigue has improved she is not having any pain.  Appreciate nephrology support.        Other   Acquired neutrophilia    This has been stable, she has seen both hematology and infectious disease no concern for active infection.  Both parties suspect at this time it may be due to lithium.  This was recently decreased we will continue to follow appreciate specialty support.      Severe episode of recurrent major depressive disorder, without psychotic features ([place])    Appreciate the support of psychiatry, she feels she is doing much better on Klonopin 1 mg twice daily for her tremors, she is also on lithium which was reduced to 300 twice daily which has improved her symptoms and she is taking Paxil 60 mg daily      Mild  protein-calorie malnutrition ([place])    Appetite is still poor, however she is trying to eat.  Discussed consideration for a supplement today, she is will try to find one that she likes.  Suspect this is the reason why her potassium is low, pending labs next week she may need ongoing supplementation until her p.o. intake increases.      Hypokalemia - Primary    Etiology is unclear, she has not appropriately supplemented.  Not on any diuretics.  Though she does have poor p.o. intake Long discussion about foods with potassium in it.  Also encouraged trying to find a protein supplement.  She will continue oral supplement 40 mg once per day repeat labs on Monday.  If results are normal she will come off of the supplement and recheck to see whether or not it still needed with increased p.o. intake.      Relevant Orders   Potassium   Other fatigue    Her fatigue overall is improving.  She is getting continued specialty support for ongoing issues.  Discussed remaining out of work through next week due to need for likely 2 lab appointments as well as a nephrology appointment.  We will continue to rest and increase activity as tolerated.  This time she does need to rest for a few minutes after chores at home, but she notes her job is mostly sitting at a desk.  Discussed that she should be able to go back to work on July 31 with a plan for breaks as needed where she can sit and do desk work at that time.         Return if symptoms worsen or fail to improve.  [name], MD

## 2022-02-24 NOTE — Assessment & Plan Note (Signed)
Appetite is still poor, however she is trying to eat.  Discussed consideration for a supplement today, she is will try to find one that she likes.  Suspect this is the reason why her potassium is low, pending labs next week she may need ongoing supplementation until her p.o. intake increases.

## 2022-02-24 NOTE — Assessment & Plan Note (Signed)
Etiology is unclear, she has not appropriately supplemented.  Not on any diuretics.  Though she does have poor p.o. intake Long discussion about foods with potassium in it.  Also encouraged trying to find a protein supplement.  She will continue oral supplement 40 mg once per day repeat labs on Monday.  If results are normal she will come off of the supplement and recheck to see whether or not it still needed with increased p.o. intake.

## 2022-03-01 ENCOUNTER — Other Ambulatory Visit: Payer: Self-pay | Admitting: Family Medicine

## 2022-03-01 ENCOUNTER — Other Ambulatory Visit (INDEPENDENT_AMBULATORY_CARE_PROVIDER_SITE_OTHER): Payer: Managed Care, Other (non HMO)

## 2022-03-01 DIAGNOSIS — E876 Hypokalemia: Secondary | ICD-10-CM

## 2022-03-01 LAB — POTASSIUM: Potassium: 3.6 mEq/L (ref 3.5–5.1)

## 2022-03-04 ENCOUNTER — Other Ambulatory Visit: Payer: Self-pay | Admitting: Family Medicine

## 2022-03-04 ENCOUNTER — Other Ambulatory Visit (INDEPENDENT_AMBULATORY_CARE_PROVIDER_SITE_OTHER): Payer: Managed Care, Other (non HMO)

## 2022-03-04 DIAGNOSIS — E876 Hypokalemia: Secondary | ICD-10-CM | POA: Diagnosis not present

## 2022-03-04 LAB — POTASSIUM: Potassium: 2.9 mEq/L — ABNORMAL LOW (ref 3.5–5.1)

## 2022-03-04 MED ORDER — POTASSIUM CHLORIDE CRYS ER 20 MEQ PO TBCR
20.0000 meq | EXTENDED_RELEASE_TABLET | Freq: Two times a day (BID) | ORAL | 0 refills | Status: DC
Start: 2022-03-04 — End: 2022-04-14

## 2022-03-07 NOTE — Telephone Encounter (Signed)
Patient called and said her job still hasn received the form and asked if it could be sent again, she said shes at work right now and they are talking about sending her home without the form. Her callback is [phone] She asked if it could be faxed to [phone]

## 2022-03-07 NOTE — Telephone Encounter (Signed)
I refaxed all paperwork to [phone].  I called patient to notify her that I originally received a fax confirmation.  She stated the fax number I faxed to is their corporate level and requested that I also fax it directly to her store at F# [phone].  I faxed to that fax # on front fax machine and my email fax and have received two confirmations.  I called and notified patient that I got confirmation.

## 2022-03-09 ENCOUNTER — Encounter: Payer: Self-pay | Admitting: Family Medicine

## 2022-03-09 DIAGNOSIS — Z8619 Personal history of other infectious and parasitic diseases: Secondary | ICD-10-CM

## 2022-03-09 DIAGNOSIS — R197 Diarrhea, unspecified: Secondary | ICD-10-CM

## 2022-03-11 ENCOUNTER — Other Ambulatory Visit: Payer: Managed Care, Other (non HMO)

## 2022-03-11 DIAGNOSIS — Z8619 Personal history of other infectious and parasitic diseases: Secondary | ICD-10-CM

## 2022-03-11 DIAGNOSIS — R197 Diarrhea, unspecified: Secondary | ICD-10-CM

## 2022-03-13 ENCOUNTER — Other Ambulatory Visit: Payer: Self-pay | Admitting: Primary Care

## 2022-03-13 DIAGNOSIS — I700 Atherosclerosis of aorta: Secondary | ICD-10-CM

## 2022-03-13 DIAGNOSIS — E785 Hyperlipidemia, unspecified: Secondary | ICD-10-CM

## 2022-03-14 ENCOUNTER — Telehealth: Payer: Self-pay | Admitting: Radiology

## 2022-03-14 ENCOUNTER — Encounter: Payer: Self-pay | Admitting: Family Medicine

## 2022-03-14 ENCOUNTER — Telehealth: Payer: Self-pay | Admitting: Family Medicine

## 2022-03-14 DIAGNOSIS — A498 Other bacterial infections of unspecified site: Secondary | ICD-10-CM

## 2022-03-14 LAB — C. DIFFICILE GDH AND TOXIN A/B
GDH ANTIGEN: DETECTED
MICRO NUMBER:: 13737566
SPECIMEN QUALITY:: ADEQUATE
TOXIN A AND B: NOT DETECTED

## 2022-03-14 LAB — CLOSTRIDIUM DIFFICILE TOXIN B, QUALITATIVE, REAL-TIME PCR: Toxigenic C. Difficile by PCR: DETECTED — AB

## 2022-03-14 MED ORDER — FIDAXOMICIN 200 MG PO TABS: 200.0000 mg | ORAL_TABLET | Freq: Two times a day (BID) | ORAL | 0 refills | Status: DC

## 2022-03-14 NOTE — Telephone Encounter (Signed)
[facility] called a critical result, CDiff - positive. Results given to Dr Einar Pheasant.

## 2022-03-14 NOTE — Telephone Encounter (Signed)
Results given to Dr Lorelei Pont and sent to Dr Einar Pheasant

## 2022-03-14 NOTE — Telephone Encounter (Signed)
Patient called to follow up on results would like call back as soon as reviewed.

## 2022-03-14 NOTE — Telephone Encounter (Signed)
Patient called in to follow up on her lab results for C. Diff. Patient has been checking her MyChart and just want to know if any results have been released. Please advise. Thank you!

## 2022-03-14 NOTE — Telephone Encounter (Signed)
Spoke to pt after she had already responded to Dr. Einar Pheasant via Deloris Ping. Message routed to PCP.

## 2022-03-14 NOTE — Telephone Encounter (Signed)
See other encounter.

## 2022-03-14 NOTE — Telephone Encounter (Signed)
Routing to MA to notify patient that c diff was positive and I will send in another course of antibiotics. Please ask Aniela if her diarrhea improved all on antibiotics she got in June. We may need to do a different medication if there was no period of no diarrhea.   Please also have her update if no improvement in 2 days.   Routing to Infectious disease who saw patient previously to see if they have any additional suggestions.   Treated for C diff June 2 with Dificid. Seems this is still preferred for recurrent infection. But appreciate guidance for other recommendations

## 2022-03-15 MED ORDER — VANCOMYCIN HCL 125 MG PO CAPS: 125.0000 mg | ORAL_CAPSULE | Freq: Four times a day (QID) | ORAL | 0 refills | Status: AC

## 2022-03-15 NOTE — Addendum Note (Signed)
Addended by: [name] on: 03/15/2022 08:20 AM   Modules accepted: Orders

## 2022-03-15 NOTE — Telephone Encounter (Signed)
Change in therapy to Vancomycin based on recommendation by ID. Will notify via [facility].

## 2022-03-17 ENCOUNTER — Encounter: Payer: Self-pay | Admitting: Family Medicine

## 2022-03-20 DIAGNOSIS — R42 Dizziness and giddiness: Secondary | ICD-10-CM | POA: Insufficient documentation

## 2022-03-20 DIAGNOSIS — R251 Tremor, unspecified: Secondary | ICD-10-CM | POA: Insufficient documentation

## 2022-03-24 ENCOUNTER — Telehealth (HOSPITAL_BASED_OUTPATIENT_CLINIC_OR_DEPARTMENT_OTHER): Payer: 59 | Admitting: Psychiatry

## 2022-03-24 DIAGNOSIS — F411 Generalized anxiety disorder: Secondary | ICD-10-CM | POA: Diagnosis not present

## 2022-03-24 DIAGNOSIS — F332 Major depressive disorder, recurrent severe without psychotic features: Secondary | ICD-10-CM | POA: Diagnosis not present

## 2022-03-24 MED ORDER — CLONAZEPAM 1 MG PO TABS: 1.0000 mg | ORAL_TABLET | Freq: Two times a day (BID) | ORAL | 0 refills | Status: DC | PRN

## 2022-03-24 NOTE — Progress Notes (Signed)
Virtual Visit via phone Note  I connected with Jennifer [name] on 03/24/22 at  4:30 PM EDT by phone. We attempted to connect by  a video enabled telemedicine application but could not hear each other. I verified that I am speaking with the correct person using two identifiers.  Location: Patient: home  Provider: office   I discussed the limitations of evaluation and management by telemedicine and the availability of in person appointments. The patient expressed understanding and agreed to proceed.  History of Present Illness: Jennifer [name] is still recovering from being sick with C.dif. Her hands and feet shake constantly even with the decrease the Lithium. Her whole body is no longer shaking and is able to walk straight up. Her sleep is good with Klonopin. Jennifer [name] started work again this week. She has been a little more active. She is not nearly as anxious or angry at work with Klonopin in the morning.  Her mom is very sick and they removed one of her lung lobes. Due to Jennifer [name]'s own illness she was not able to see her mom in the hospital.  Jennifer [name] is now on a new antibiotic and it seems to be helping. Jennifer [name] is dealing with some depression. This week and last week she was depressed for several hours on about 5 different days. It is usually when she thinks about something stressful. She watches tv, plays wit her dogs or sits in her hot tub to calm down. Her energy is limited. Her appetite is poor because nothing tastes good. Her focus is good. She denies SI/HI.    Observations/Objective: Psychiatric Specialty Exam:  General Appearance: unable to assess  Eye Contact:  unable to assess  Speech:  Clear and Coherent and Normal Rate  Volume:  Normal  Mood:  Anxious and Depressed  Affect:  Full Range  Thought Process:  Goal Directed, Linear, and Descriptions of Associations: Intact  Orientation:  Full (Time, Place, and Person)  Thought Content:  Logical  Suicidal Thoughts:  No  Homicidal Thoughts:   No  Memory:  Immediate;   Good  Judgement:  Good  Insight:  Good  Psychomotor Activity: unable to assess  Concentration:  Concentration: Good  Recall:  Good  Fund of Knowledge:  Good  Language:  Good  Akathisia:  unable to assess  Handed:  unable to assess  AIMS (if indicated):     Assets:  Communication Skills Desire for Improvement Financial Resources/Insurance Housing Resilience Talents/Skills Transportation Vocational/Educational  ADL's:  unable to assess  Cognition:  WNL  Sleep:         Assessment and Plan:     03/24/2022    4:40 PM 02/24/2022   11:07 AM 02/17/2022    3:50 PM 02/15/2022   11:30 AM 12/16/2021    4:12 PM  Depression screen PHQ 2/9  Decreased Interest 1 2 0 0 0  Down, Depressed, Hopeless '2 2 1 1 1  '$ PHQ - 2 Score '3 4 1 1 1  '$ Altered sleeping 0 0   0  Tired, decreased energy '3 2   1  '$ Change in appetite 2 2   0  Feeling bad or failure about yourself  '1 1   1  '$ Trouble concentrating 0 0   0  Moving slowly or fidgety/restless 0 0   0  Suicidal thoughts 0    0  PHQ-9 Score '9 9   3  '$ Difficult doing work/chores Somewhat difficult    Very difficult    Flowsheet Row Video  Visit from 03/24/2022 in [facility] Video Visit from 02/17/2022 in [facility] ED from 01/24/2022 in [facility]  C-SSRS RISK CATEGORY No Risk No Risk No Risk        Pt is aware that these meds carry a teratogenic risk. Pt will discuss plan of action if she does or plans to become pregnant in the future.  Status of current problems: ongoing anxiety and depression. Reporting shaking in hands and feet  Meds: d/c Lithium due to potentially causing tremors. Continue Paxil '60mg'$  po qD 1. Severe episode of recurrent major depressive disorder, without psychotic features ([place])  2. GAD (generalized anxiety disorder) - clonazePAM (KLONOPIN) 1 MG tablet; Take 1 tablet (1 mg total)  by mouth 2 (two) times daily as needed for anxiety.  Dispense: 60 tablet; Refill: 0     Labs: none today    Therapy: brief supportive therapy provided. Discussed psychosocial stressors in detail.     Collaboration of Care: Other none  Patient/Guardian was advised Release of Information must be obtained prior to any record release in order to collaborate their care with an outside provider. Patient/Guardian was advised if they have not already done so to contact the registration department to sign all necessary forms in order for [place] to release information regarding their care.   Consent: Patient/Guardian gives verbal consent for treatment and assignment of benefits for services provided during this visit. Patient/Guardian expressed understanding and agreed to proceed.     Follow Up Instructions: Follow up in 2-4 weeks or sooner if needed    I discussed the assessment and treatment plan with the patient. The patient was provided an opportunity to ask questions and all were answered. The patient agreed with the plan and demonstrated an understanding of the instructions.   The patient was advised to call back or seek an in-person evaluation if the symptoms worsen or if the condition fails to improve as anticipated.  I provided 18 minutes of non-face-to-face time during this encounter.   [name], MD

## 2022-03-30 ENCOUNTER — Ambulatory Visit (INDEPENDENT_AMBULATORY_CARE_PROVIDER_SITE_OTHER): Payer: Managed Care, Other (non HMO) | Admitting: Nurse Practitioner

## 2022-03-30 ENCOUNTER — Telehealth: Payer: Self-pay

## 2022-03-30 VITALS — BP 98/60 | HR 70 | Temp 97.6°F | Resp 12

## 2022-03-30 DIAGNOSIS — A09 Infectious gastroenteritis and colitis, unspecified: Secondary | ICD-10-CM | POA: Diagnosis not present

## 2022-03-30 DIAGNOSIS — R8290 Unspecified abnormal findings in urine: Secondary | ICD-10-CM

## 2022-03-30 DIAGNOSIS — R300 Dysuria: Secondary | ICD-10-CM | POA: Diagnosis not present

## 2022-03-30 DIAGNOSIS — M5440 Lumbago with sciatica, unspecified side: Secondary | ICD-10-CM

## 2022-03-30 DIAGNOSIS — Z8619 Personal history of other infectious and parasitic diseases: Secondary | ICD-10-CM | POA: Diagnosis not present

## 2022-03-30 LAB — POCT URINALYSIS DIPSTICK
Bilirubin, UA: NEGATIVE
Glucose, UA: NEGATIVE
Ketones, UA: NEGATIVE
Nitrite, UA: NEGATIVE
Protein, UA: NEGATIVE
Spec Grav, UA: 1.01 (ref 1.010–1.025)
Urobilinogen, UA: 0.2 E.U./dL
pH, UA: 6 (ref 5.0–8.0)

## 2022-03-30 MED ORDER — PREDNISONE 20 MG PO TABS: ORAL_TABLET | ORAL | 0 refills | Status: AC

## 2022-03-30 NOTE — Assessment & Plan Note (Signed)
Just finished a course of vancomycin today

## 2022-03-30 NOTE — Assessment & Plan Note (Signed)
Vague symptom. UA did have leukocytes. Given recent battle with C diff had discussion with patient and we mutually agreed to defer abx until urine culture returns. S/s reviewed when to be re-evaluated

## 2022-03-30 NOTE — Progress Notes (Signed)
Established Patient Office Visit  Subjective   Patient ID: Jennifer [name], female    DOB: 1966/04/08  Age: 56 y.o. MRN: [id number]  Chief Complaint  Patient presents with   stomach issues    Has had recurrent C Diff recently. Finished last dose on Vancomycin at 2 am this morning. This antibiotic seemed to work better than the first one. She is still having trouble with diarrhea and stomach rolling.   Back Pain    From left to the right lower back to right hip and thigh area.    HPI    Back pain : States it starts on fthe left and goes right hip and stops mid right thigh. States that it is all the time that standing up straight makes it worse. Certain movements make it worse. Sharp shooting pain. States tylenol has not helped  Abdominal Pain: States that she had a kidney stone that went sepsis in may. States they admitted her to [facility]. States that she was tested c diff came back first round of medication and did not work. States she did a round of vancomycin and states she finished it around 2am. States that still having diarrhea. States that she felt that the bowel started forming. No blood. States that she is doing probiotic and one a day vitamin.  States that she see ID for the c diff. No follow up with ID.    Review of Systems  Constitutional:  Negative for chills and fever.  Gastrointestinal:  Positive for diarrhea. Negative for abdominal pain, nausea and vomiting.  Genitourinary:  Positive for dysuria and frequency. Negative for hematuria.       Lots of fluid      Objective:     BP 98/60   Pulse 70   Temp 97.6 F (36.4 C)   Resp 12   SpO2 97%    Physical Exam Vitals and nursing note reviewed.  Constitutional:      Appearance: Normal appearance. She is obese.  Cardiovascular:     Rate and Rhythm: Normal rate and regular rhythm.     Heart sounds: Normal heart sounds.  Pulmonary:     Effort: Pulmonary effort is normal.     Breath sounds: Normal breath sounds.   Abdominal:     General: Bowel sounds are normal. There is no distension.     Palpations: There is no mass.     Tenderness: There is abdominal tenderness.     Hernia: No hernia is present.  Musculoskeletal:     Lumbar back: Tenderness present. No bony tenderness. Positive right straight leg raise test. Negative left straight leg raise test.       Back:     Right lower leg: No edema.     Left lower leg: No edema.  Neurological:     Mental Status: She is alert.      Results for orders placed or performed in visit on 03/30/22  POCT urinalysis dipstick  Result Value Ref Range   Color, UA yellow    Clarity, UA hazy    Glucose, UA Negative Negative   Bilirubin, UA negative    Ketones, UA negative    Spec Grav, UA 1.010 1.010 - 1.025   Blood, UA trace    pH, UA 6.0 5.0 - 8.0   Protein, UA Negative Negative   Urobilinogen, UA 0.2 0.2 or 1.0 E.U./dL   Nitrite, UA negative    Leukocytes, UA Moderate (2+) (A) Negative   Appearance  Odor        The 10-year ASCVD risk score (Arnett DK, et al., 2019) is: 3.6%* (Cholesterol units were assumed)    Assessment & Plan:   Problem List Items Addressed This Visit       Digestive   Diarrhea of infectious origin    Patient just finished her second course of abx for c diff. She was written difficid then just finished vancomycin      Relevant Orders   CBC   Comprehensive metabolic panel     Nervous and Auditory   Back pain of lumbar region with sciatica    Exam consistent with sciatica. Will treat with prednisone taper. Prednisone precautions reviewed      Relevant Medications   predniSONE (DELTASONE) 20 MG tablet     Other   Dysuria - Primary    Vague symptom. UA did have leukocytes. Given recent battle with C diff had discussion with patient and we mutually agreed to defer abx until urine culture returns. S/s reviewed when to be re-evaluated      Relevant Orders   POCT urinalysis dipstick (Completed)   Urine Culture    History of Clostridioides difficile infection    Just finished a course of vancomycin today      Abnormal urine    UA likely show infection. Given patient being stable in office and practically asymptomatic we did mutually agree to wait until urine culture results given battle with C. diff      Relevant Orders   Urine Culture    Return if symptoms worsen or fail to improve.    Romilda Garret, NP

## 2022-03-30 NOTE — Assessment & Plan Note (Signed)
Exam consistent with sciatica. Will treat with prednisone taper. Prednisone precautions reviewed

## 2022-03-30 NOTE — Telephone Encounter (Signed)
error 

## 2022-03-30 NOTE — Assessment & Plan Note (Signed)
UA likely show infection. Given patient being stable in office and practically asymptomatic we did mutually agree to wait until urine culture results given battle with C. diff

## 2022-03-30 NOTE — Patient Instructions (Signed)
Nice to see you today I will be in touch with the labs and urine culture results Follow up if no improvement

## 2022-03-30 NOTE — Assessment & Plan Note (Signed)
Patient just finished her second course of abx for c diff. She was written difficid then just finished vancomycin

## 2022-03-31 ENCOUNTER — Encounter: Payer: Self-pay | Admitting: Nurse Practitioner

## 2022-03-31 LAB — COMPREHENSIVE METABOLIC PANEL
ALT: 14 U/L (ref 0–35)
AST: 18 U/L (ref 0–37)
Albumin: 3.8 g/dL (ref 3.5–5.2)
Alkaline Phosphatase: 67 U/L (ref 39–117)
BUN: 7 mg/dL (ref 6–23)
CO2: 22 mEq/L (ref 19–32)
Calcium: 8.8 mg/dL (ref 8.4–10.5)
Chloride: 107 mEq/L (ref 96–112)
Creatinine, Ser: 0.74 mg/dL (ref 0.40–1.20)
GFR: 89.11 mL/min (ref >60.00)
Glucose, Bld: 84 mg/dL (ref 70–99)
Potassium: 3.2 mEq/L — ABNORMAL LOW (ref 3.5–5.1)
Sodium: 140 mEq/L (ref 135–145)
Total Bilirubin: 0.3 mg/dL (ref 0.2–1.2)
Total Protein: 6.2 g/dL (ref 6.0–8.3)

## 2022-03-31 LAB — CBC
HCT: 42.6 % (ref 36.0–46.0)
Hemoglobin: 14.3 g/dL (ref 12.0–15.0)
MCHC: 33.5 g/dL (ref 30.0–36.0)
MCV: 90.5 fl (ref 78.0–100.0)
Platelets: 263 10*3/uL (ref 150.0–400.0)
RBC: 4.7 Mil/uL (ref 3.87–5.11)
RDW: 13.9 % (ref 11.5–15.5)
WBC: 10.2 10*3/uL (ref 4.0–10.5)

## 2022-04-01 NOTE — Telephone Encounter (Signed)
Noted. No change or anything further

## 2022-04-02 LAB — URINE CULTURE
MICRO NUMBER:: 13820649
SPECIMEN QUALITY:: ADEQUATE

## 2022-04-04 ENCOUNTER — Encounter: Payer: Self-pay | Admitting: Nurse Practitioner

## 2022-04-04 ENCOUNTER — Other Ambulatory Visit: Payer: Self-pay | Admitting: Nurse Practitioner

## 2022-04-04 DIAGNOSIS — N390 Urinary tract infection, site not specified: Secondary | ICD-10-CM

## 2022-04-04 MED ORDER — CEPHALEXIN 500 MG PO CAPS: 500.0000 mg | ORAL_CAPSULE | Freq: Two times a day (BID) | ORAL | 0 refills | Status: AC

## 2022-04-04 MED ORDER — CEPHALEXIN 500 MG PO CAPS: 500.0000 mg | ORAL_CAPSULE | Freq: Two times a day (BID) | ORAL | 0 refills | Status: DC

## 2022-04-04 NOTE — Telephone Encounter (Signed)
I re sent RX for antibiotic to [facility] as patient requested

## 2022-04-12 ENCOUNTER — Encounter: Payer: Self-pay | Admitting: Internal Medicine

## 2022-04-12 ENCOUNTER — Encounter: Payer: Self-pay | Admitting: Family Medicine

## 2022-04-12 DIAGNOSIS — E876 Hypokalemia: Secondary | ICD-10-CM

## 2022-04-12 NOTE — Telephone Encounter (Signed)
Also, feeling so much better. Still tired but able to be a bit more active. And also back to work Please let Dr. Einar Pheasant know.Marland KitchenMarland KitchenMarland Kitchen

## 2022-04-13 NOTE — Addendum Note (Signed)
Addended by: [name] on: 04/13/2022 09:47 AM   Modules accepted: Orders

## 2022-04-14 ENCOUNTER — Telehealth (HOSPITAL_BASED_OUTPATIENT_CLINIC_OR_DEPARTMENT_OTHER): Payer: 59 | Admitting: Psychiatry

## 2022-04-14 DIAGNOSIS — F411 Generalized anxiety disorder: Secondary | ICD-10-CM | POA: Diagnosis not present

## 2022-04-14 DIAGNOSIS — F332 Major depressive disorder, recurrent severe without psychotic features: Secondary | ICD-10-CM

## 2022-04-14 MED ORDER — CLONAZEPAM 1 MG PO TABS: 1.0000 mg | ORAL_TABLET | Freq: Two times a day (BID) | ORAL | 0 refills | Status: DC | PRN

## 2022-04-14 MED ORDER — POTASSIUM CHLORIDE 20 MEQ/15ML (10%) PO SOLN: 20.0000 meq | Freq: Every day | ORAL | 0 refills | Status: DC

## 2022-04-14 NOTE — Progress Notes (Signed)
Virtual Visit via Phone Note  I connected with Jennifer [name] on 04/14/22 at  3:15 PM EDT by  phone. We attempted to connect a video enabled telemedicine application but she could not hear me. After several different attempts we opted to continue by phone. I verified that I am speaking with the correct person using two identifiers.  Location: Patient: home Provider: office   I discussed the limitations of evaluation and management by telemedicine and the availability of in person appointments. The patient expressed understanding and agreed to proceed.  History of Present Illness: "I'm feeling better". Jennifer [name] has been sick again. She got started on antibiotic this week and thinks the C. Diff is back. It is frustrating. It has been 3 weeks since she stopped Lithium. Her hands and feet have stopped shacking. Her mood is "fine". She denies depression and anhedonia. She denies SI/HI. She has been working in her yard. She is going slow and works until she feels tired and then comes in. Her mom's health has improved. She is talking to her mom twice/day. Jennifer [name]'s anxiety has decreased since her mom is doing better. She is taking taking Klonopin BID but never the whole tab. Jennifer [name] is "making good decisions and is not crying all the time". She is sleeping well. Her energy is limited. Her appetite is ok. The negative self talk has resolved.    Observations/Objective: Psychiatric Specialty Exam:  General Appearance: unable to assess  Eye Contact:  unable to assess  Speech:  Clear and Coherent and Normal Rate  Volume:  Normal  Mood:  Euthymic  Affect:  Full Range  Thought Process:  Goal Directed, Linear, and Descriptions of Associations: Intact  Orientation:  Full (Time, Place, and Person)  Thought Content:  Logical  Suicidal Thoughts:  No  Homicidal Thoughts:  No  Memory:  Immediate;   Good  Judgement:  Good  Insight:  Good  Psychomotor Activity: unable to assess  Concentration:   Concentration: Good  Recall:  Good  Fund of Knowledge:  Good  Language:  Good  Akathisia:  unable to assess  Handed:  unable to assess  AIMS (if indicated):     Assets:  Communication Skills Desire for Improvement Financial Resources/Insurance Housing Intimacy Leisure Time Resilience Social Support Talents/Skills Transportation Vocational/Educational  ADL's:  unable to assess  Cognition:  WNL  Sleep:         Assessment and Plan:     04/14/2022    3:27 PM 03/24/2022    4:40 PM 02/24/2022   11:07 AM 02/17/2022    3:50 PM 02/15/2022   11:30 AM  Depression screen PHQ 2/9  Decreased Interest 0 1 2 0 0  Down, Depressed, Hopeless 0 '2 2 1 1  '$ PHQ - 2 Score 0 '3 4 1 1  '$ Altered sleeping 0 0 0    Tired, decreased energy '1 3 2    '$ Change in appetite 0 2 2    Feeling bad or failure about yourself  0 1 1    Trouble concentrating 0 0 0    Moving slowly or fidgety/restless 0 0 0    Suicidal thoughts 0 0     PHQ-9 Score '1 9 9    '$ Difficult doing work/chores Not difficult at all Somewhat difficult       Flowsheet Row Video Visit from 04/14/2022 in [facility] Video Visit from 03/24/2022 in [facility] Video Visit from 02/17/2022 in [facility]  C-SSRS RISK CATEGORY No Risk No Risk No Risk        Pt is aware that these meds carry a teratogenic risk. Pt will discuss plan of action if she does or plans to become pregnant in the future.  Status of current problems: significant improvement in anxiety. Mood is stable.   Meds:  Continue- Klonopin '1mg'$  BID prn anxiety Paxil '50mg'$  po qD 1. GAD (generalized anxiety disorder) - clonazePAM (KLONOPIN) 1 MG tablet; Take 1 tablet (1 mg total) by mouth 2 (two) times daily as needed for anxiety.  Dispense: 60 tablet; Refill: 0  2. Severe episode of recurrent major depressive disorder, without psychotic features ([place])     Labs:  none    Therapy: brief supportive therapy provided. Discussed psychosocial stressors in detail.     Collaboration of Care: Other none  Patient/Guardian was advised Release of Information must be obtained prior to any record release in order to collaborate their care with an outside provider. Patient/Guardian was advised if they have not already done so to contact the registration department to sign all necessary forms in order for [place] to release information regarding their care.   Consent: Patient/Guardian gives verbal consent for treatment and assignment of benefits for services provided during this visit. Patient/Guardian expressed understanding and agreed to proceed.    Follow Up Instructions: Follow up in 2-3 months or sooner if needed    I discussed the assessment and treatment plan with the patient. The patient was provided an opportunity to ask questions and all were answered. The patient agreed with the plan and demonstrated an understanding of the instructions.   The patient was advised to call back or seek an in-person evaluation if the symptoms worsen or if the condition fails to improve as anticipated.  I provided 12 minutes of non-face-to-face time during this encounter.   [name], MD

## 2022-04-14 NOTE — Addendum Note (Signed)
Addended by: [name] on: 04/14/2022 09:59 AM   Modules accepted: Orders

## 2022-04-18 ENCOUNTER — Other Ambulatory Visit (INDEPENDENT_AMBULATORY_CARE_PROVIDER_SITE_OTHER): Payer: Managed Care, Other (non HMO)

## 2022-04-18 DIAGNOSIS — E876 Hypokalemia: Secondary | ICD-10-CM | POA: Diagnosis not present

## 2022-04-19 ENCOUNTER — Ambulatory Visit: Payer: Managed Care, Other (non HMO) | Admitting: Family Medicine

## 2022-04-19 VITALS — BP 100/60 | HR 69 | Temp 97.6°F | Ht 68.0 in | Wt 200.1 lb

## 2022-04-19 DIAGNOSIS — L247 Irritant contact dermatitis due to plants, except food: Secondary | ICD-10-CM | POA: Diagnosis not present

## 2022-04-19 DIAGNOSIS — Z8619 Personal history of other infectious and parasitic diseases: Secondary | ICD-10-CM

## 2022-04-19 DIAGNOSIS — E876 Hypokalemia: Secondary | ICD-10-CM

## 2022-04-19 DIAGNOSIS — N390 Urinary tract infection, site not specified: Secondary | ICD-10-CM | POA: Diagnosis not present

## 2022-04-19 DIAGNOSIS — R197 Diarrhea, unspecified: Secondary | ICD-10-CM

## 2022-04-19 LAB — POC URINALSYSI DIPSTICK (AUTOMATED)
Bilirubin, UA: NEGATIVE
Blood, UA: NEGATIVE
Glucose, UA: NEGATIVE
Ketones, UA: NEGATIVE
Leukocytes, UA: NEGATIVE
Nitrite, UA: NEGATIVE
Protein, UA: NEGATIVE
Spec Grav, UA: <=1.005 — AB (ref 1.010–1.025)
Urobilinogen, UA: 0.2 E.U./dL
pH, UA: 6 (ref 5.0–8.0)

## 2022-04-19 LAB — POTASSIUM: Potassium: 3.2 mEq/L — ABNORMAL LOW (ref 3.5–5.1)

## 2022-04-19 NOTE — Assessment & Plan Note (Signed)
UA clear in office today suggesting against ongoing UTI.  Push fluids.

## 2022-04-19 NOTE — Progress Notes (Signed)
Patient ID: Jennifer [name], female    DOB: August 04, 1966, 56 y.o.   MRN: [id number]  This visit was conducted in person.  BP 100/60   Pulse 69   Temp 97.6 F (36.4 C) (Temporal)   Ht '5\' 8"'$  (1.727 m)   Wt 200 lb 2 oz (90.8 kg)   SpO2 98%   BMI 30.43 kg/m    CC:  Chief Complaint  Patient presents with   Urinary Tract Infection    Pt doesn't feel that the infection has resolved with last antibiotic. Pt states that her urine is still cloudy and has an odor.    Poison Ivy    Subjective:   HPI: Jennifer [name] is a 56 y.o. female presenting on 04/19/2022 for Urinary Tract Infection (Pt doesn't feel that the infection has resolved with last antibiotic. Pt states that her urine is still cloudy and has an odor. ) and Poison Ivy  Positive urine culture on 03/30/2022 EColi  Treated with keflex.  She still has cloudy urine and odor to urine.  Drinking a lot of water 6 x 16 oz water per day. No N/V, no fever, no blood in stool.   She noted rash appearing 4 days after   She is treating with benadryl and hydrocortisone cream.  No SOB, no facial rash.  Improving with time.     Continues with diarrhea.Marland Kitchen 4-6 per day , very loose not watery. Potassium still low.. she is taking potassium 20 mEq.  Dx with cdiff in 12/2021  Treated with  Dificid... did npt help.  Treated with vancomycin 14 day course per ID Did not change diarrhea.  Relevant past medical, surgical, family and social history reviewed and updated as indicated. Interim medical history since our last visit reviewed. Allergies and medications reviewed and updated. Outpatient Medications Prior to Visit  Medication Sig Dispense Refill   acetaminophen (TYLENOL) 325 MG tablet Take 650 mg by mouth every 6 (six) hours as needed for moderate pain.     albuterol (VENTOLIN HFA) 108 (90 Base) MCG/ACT inhaler Inhale 1-2 puffs into the lungs every 6 (six) hours as needed for wheezing or shortness of breath. 6.7 g 0   ASPIRIN LOW DOSE 81  MG EC tablet TAKE ONE TABLET BY MOUTH DAILY 90 tablet 3   atorvastatin (LIPITOR) 10 MG tablet TAKE 1 TABLET BY MOUTH DAILY **NEEDS APPOINTMENT** 90 tablet 3   bisacodyl (DULCOLAX) 5 MG EC tablet Take 15 mg by mouth daily as needed for moderate constipation.     clindamycin (CLINDAGEL) 1 % gel Apply 1 application. topically daily as needed (For lips).     clonazePAM (KLONOPIN) 1 MG tablet Take 1 tablet (1 mg total) by mouth 2 (two) times daily as needed for anxiety. 60 tablet 0   ipratropium (ATROVENT) 0.06 % nasal spray Place 1 spray into both nostrils daily as needed for rhinitis.     Multiple Vitamin (MULTIVITAMIN) capsule Take 1 capsule by mouth daily.     oxybutynin (DITROPAN-XL) 5 MG 24 hr tablet Take 5 mg by mouth at bedtime.     PARoxetine (PAXIL) 40 MG tablet Take 1.5 tablets (60 mg total) by mouth every morning. 135 tablet 0   potassium chloride 20 MEQ/15ML (10%) SOLN Take 15 mLs (20 mEq total) by mouth daily. 473 mL 0   Probiotic Product (UP4 PROBIOTICS ADULT PO) Take by mouth daily.     promethazine (PHENERGAN) 25 MG tablet Take 1 tablet (25 mg total) by mouth  every 6 (six) hours as needed for nausea or vomiting. 30 tablet 0   sodium fluoride (FLUORISHIELD) 1.1 % GEL dental gel Place 1 application  onto teeth every other day.     valACYclovir (VALTREX) 1000 MG tablet Take 1,000 mg by mouth daily as needed (for fever blisters).     No facility-administered medications prior to visit.     Per HPI unless specifically indicated in ROS section below Review of Systems  Constitutional:  Negative for fatigue and fever.  HENT:  Negative for congestion.   Eyes:  Negative for pain.  Respiratory:  Negative for cough and shortness of breath.   Cardiovascular:  Negative for chest pain, palpitations and leg swelling.  Gastrointestinal:  Positive for diarrhea. Negative for abdominal pain.  Genitourinary:  Negative for dysuria and vaginal bleeding.  Musculoskeletal:  Negative for back pain.   Neurological:  Negative for syncope, light-headedness and headaches.  Psychiatric/Behavioral:  Negative for dysphoric mood.    Objective:  BP 100/60   Pulse 69   Temp 97.6 F (36.4 C) (Temporal)   Ht '5\' 8"'$  (1.727 m)   Wt 200 lb 2 oz (90.8 kg)   SpO2 98%   BMI 30.43 kg/m   Wt Readings from Last 3 Encounters:  04/19/22 200 lb 2 oz (90.8 kg)  02/24/22 202 lb (91.6 kg)  02/23/22 201 lb 6.4 oz (91.4 kg)      Physical Exam Constitutional:      General: She is not in acute distress.    Appearance: Normal appearance. She is well-developed. She is not ill-appearing or toxic-appearing.  HENT:     Head: Normocephalic.     Right Ear: Hearing, tympanic membrane, ear canal and external ear normal. Tympanic membrane is not erythematous, retracted or bulging.     Left Ear: Hearing, tympanic membrane, ear canal and external ear normal. Tympanic membrane is not erythematous, retracted or bulging.     Nose: No mucosal edema or rhinorrhea.     Right Sinus: No maxillary sinus tenderness or frontal sinus tenderness.     Left Sinus: No maxillary sinus tenderness or frontal sinus tenderness.     Mouth/Throat:     Pharynx: Uvula midline.  Eyes:     General: Lids are normal. Lids are everted, no foreign bodies appreciated.     Conjunctiva/sclera: Conjunctivae normal.     Pupils: Pupils are equal, round, and reactive to light.  Neck:     Thyroid: No thyroid mass or thyromegaly.     Vascular: No carotid bruit.     Trachea: Trachea normal.  Cardiovascular:     Rate and Rhythm: Normal rate and regular rhythm.     Pulses: Normal pulses.     Heart sounds: Normal heart sounds, S1 normal and S2 normal. No murmur heard.    No friction rub. No gallop.  Pulmonary:     Effort: Pulmonary effort is normal. No tachypnea or respiratory distress.     Breath sounds: Normal breath sounds. No decreased breath sounds, wheezing, rhonchi or rales.  Abdominal:     General: Bowel sounds are normal.     Palpations:  Abdomen is soft.     Tenderness: There is no abdominal tenderness.  Musculoskeletal:     Cervical back: Normal range of motion and neck supple.  Skin:    General: Skin is warm and dry.     Findings: No rash.          Comments: Erythematous papules and excoriation on right arm  and leg  Neurological:     Mental Status: She is alert.  Psychiatric:        Mood and Affect: Mood is not anxious or depressed.        Speech: Speech normal.        Behavior: Behavior normal. Behavior is cooperative.        Thought Content: Thought content normal.        Judgment: Judgment normal.       Results for orders placed or performed in visit on 04/19/22  POCT Urinalysis Dipstick (Automated)  Result Value Ref Range   Color, UA straw    Clarity, UA cloudy    Glucose, UA Negative Negative   Bilirubin, UA negative    Ketones, UA negative    Spec Grav, UA <=1.005 (A) 1.010 - 1.025   Blood, UA negative    pH, UA 6.0 5.0 - 8.0   Protein, UA Negative Negative   Urobilinogen, UA 0.2 0.2 or 1.0 E.U./dL   Nitrite, UA negative    Leukocytes, UA Negative Negative     COVID 19 screen:  No recent travel or known exposure to COVID19 The patient denies respiratory symptoms of COVID 19 at this time. The importance of social distancing was discussed today.   Assessment and Plan    Problem List Items Addressed This Visit     Contact dermatitis and eczema due to plant    Acute, improving with topical steroid.  Patient does not wish to try a trial of prednisone.      Diarrhea of presumed infectious origin    Initially diagnosed with C. difficile in May 2023 symptoms.  Treated with Dificid course. Diarrhea continued..  March 12, 2019 3 repeat testing for days C. difficile continued to be positive.  She was treated with 14 days of vancomycin. She states today that her diarrhea has continued.  She has had 6-8 bowel movements per day.  She pushes fluids.  I will discuss her case with her PCP who  previously consulted ID for recommendations.  At this point follow-up with ID may be indicated vs treatment with vanomycin and rifaximin.       History of Clostridioides difficile infection   Hypokalemia    Acute, continued likely due to continued diarrhea despite potassium solution 20 mill equivalents daily.      Urinary tract infection without hematuria - Primary    UA clear in office today suggesting against ongoing UTI.  Push fluids.      Relevant Orders   POCT Urinalysis Dipstick (Automated) (Completed)        Eliezer Lofts, MD

## 2022-04-19 NOTE — Patient Instructions (Signed)
I will speak with Dr. Einar Pheasant and likely ID to given recommendations given  diarrhea continued and concern for continued cDiff. Continue with fluids.

## 2022-04-19 NOTE — Assessment & Plan Note (Addendum)
Initially diagnosed with C. difficile in May 2023 symptoms.  Treated with Dificid course. Diarrhea continued..  March 12, 2019 3 repeat testing for days C. difficile continued to be positive.  She was treated with 14 days of vancomycin. She states today that her diarrhea has continued.  She has had 6-8 bowel movements per day.  She pushes fluids.  I will discuss her case with her PCP who previously consulted ID for recommendations.  At this point follow-up with ID may be indicated vs treatment with vanomycin and rifaximin.

## 2022-04-19 NOTE — Assessment & Plan Note (Signed)
Acute, continued likely due to continued diarrhea despite potassium solution 20 mill equivalents daily.

## 2022-04-19 NOTE — Assessment & Plan Note (Signed)
Acute, improving with topical steroid.  Patient does not wish to try a trial of prednisone.

## 2022-04-20 ENCOUNTER — Encounter: Payer: Self-pay | Admitting: Family Medicine

## 2022-04-20 NOTE — Telephone Encounter (Signed)
Routing to ID for support. 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 Happy to initiate any recommended therapy prior to her trip. Appreciate support

## 2022-04-21 ENCOUNTER — Encounter: Payer: Self-pay | Admitting: Infectious Diseases

## 2022-04-21 ENCOUNTER — Ambulatory Visit: Payer: Managed Care, Other (non HMO) | Attending: Infectious Diseases | Admitting: Infectious Diseases

## 2022-04-21 VITALS — BP 127/85 | HR 60 | Temp 97.1°F

## 2022-04-21 DIAGNOSIS — A0472 Enterocolitis due to Clostridium difficile, not specified as recurrent: Secondary | ICD-10-CM

## 2022-04-21 DIAGNOSIS — R197 Diarrhea, unspecified: Secondary | ICD-10-CM | POA: Insufficient documentation

## 2022-04-21 DIAGNOSIS — E876 Hypokalemia: Secondary | ICD-10-CM | POA: Diagnosis not present

## 2022-04-21 DIAGNOSIS — Z87442 Personal history of urinary calculi: Secondary | ICD-10-CM | POA: Insufficient documentation

## 2022-04-21 DIAGNOSIS — F32A Depression, unspecified: Secondary | ICD-10-CM | POA: Insufficient documentation

## 2022-04-21 MED ORDER — VANCOMYCIN HCL 250 MG/5ML PO SOLR: 250.0000 mg | Freq: Four times a day (QID) | ORAL | Status: DC

## 2022-04-21 MED ORDER — VANCOMYCIN HCL 250 MG/5ML PO SOLR: 250.0000 mg | Freq: Four times a day (QID) | ORAL | Status: AC

## 2022-04-21 NOTE — Patient Instructions (Signed)
You have 2 problems Recurrent Cdiff- we will do vancomycin taper-  '250mg'$  every 6 hours for 2 weeks 125 mg every 8 hours for 2 weeks 125 mg every 12 hours  for 2 weeks 125 mg once a day for 1 week And '125mg'$  every other day for 1 week And 125 mg twice a week for 1 week  The antibiotics you took for UTI caused Cdiff   If you just have burning or twinge or cloudy urine alone there is no need to test your urine or treat with antibiotics If there is fever or flank pain associated with the above symptoms then you need to treat as it could be a sign of kidney infection or stone blocking   People who are menopausal can have genitourinary symptoms like burning while passing urine, or pain in the vagina or dryness  and the following can prevent that   *Estrace /Premarin cream topically- peasized apply topically three times a week * Cetaphil to clean the genital area ( not soap)  * Cranberry supplement (-Knudsen cranberry concentrate- 1 ounce mixed with 8 ounces of water *wash with water after bowel movt *Probiotic for vaginal health( can try [employer]ls vaginal health) * Increase water consumption- 8 glasses a day *Ask your doctors not to check your urine on a routine basis  *Avoid antibiotics unless systemic infection/ or before cystoscopy * Kegel Exercise to strengthen pelvic floor

## 2022-04-21 NOTE — Progress Notes (Signed)
NAME: Jennifer [name]  DOB: [birth year]  MRN: [id number]  Date/Time: 04/21/2022 9:56 AM  Subjective:  Referred by PCP for diarrhea Here with Jennifer [name] mother  I last saw Jennifer [name] on 02/15/22 for leucocytosis ? Jennifer [name] is a 56 y.o. female with a history of depression, renal stone, ecoli bacteremia, urosepsis is referred to me for leucocytosis PT since May 2023 has not been feeling well- She had E,coli bacteremia and sepsis and complicated UTI due to left ureteric stone causing left hydrouretero nephrosis for which she underwent   cystoscopy and left ureteric stent placement on 12/26/21. During that hospitalization , wbc was 55 K. She received IV antibiotics  She was discharged on 12/29/21 with 7 more days of antibiotic ( augmentin). She had diarrhea and Jennifer [name] PCP checked cdiff on 01/07/22 and it was positive and she was given difficid for 10 days starting 01/11/22 On 6/14 she had stone removal and a stent was left in place which she removed on 01/24/22 by herself- This was followed by pain abdomen, anxiety and she came to ED and CT abdomen revealed b/l stones Wbc then was 23.4. Jennifer [name] pCP repeated wbc on 01/28/22 and it was 23.9 Blood culture and urine culture done on 6/23 was negative She was sent to hemeonc . Dr.B saw Jennifer [name] on 02/02/22 and noted she had chronic leucocytosis since 2008 He ordered flow cytometry, BCL, ABL1 fish, JAK2 which were normal  I saw Jennifer [name] on 02/15/22 and told Jennifer [name] not to do anything as there was no active infection. She did have some sores in Jennifer [name] mouth She saw Jennifer [name] pcp on 7/20 for hypokalemia and given supplement- Jennifer [name] note mentions no diarrhea, but patient says she haddiarrhea all along  On 03/11/22 the stool test positive for diarrhea and she was given vancomycin by Jennifer [name] PCP ( she did discuss with me). It helped initially but she took keflex for twinges when passing urine and a UC was ecoli . She never had any other symptoms This made the diarrhea worse and she is referred to me She has 5-6  episodes of watery stool every day No pain abdomen No fever But feels weak Low potassium  I Past Medical History:  Diagnosis Date   Anxiety    Cancer ([place])    Depression    Diverticulosis    Fundic gland polyps of stomach, benign    History of kidney stones    Low HDL (under 40)    Personal history of adenomatous colonic polyps 05/08/2012   Rosacea    Tenosynovitis, de Quervain    Urosepsis 10/2011   after stent    Past Surgical History:  Procedure Laterality Date   CERVICAL BIOPSY  W/ LOOP ELECTRODE EXCISION  04/2003   COLONOSCOPY     CYSTOSCOPY W/ URETERAL STENT PLACEMENT  10/14/2011   Procedure: CYSTOSCOPY WITH RETROGRADE PYELOGRAM/URETERAL STENT PLACEMENT;  Surgeon: Hanley Ben, MD;  Location: [facility];  Service: Urology;  Laterality: Right;  Right Double J Stent   CYSTOSCOPY W/ URETERAL STENT PLACEMENT Left 12/26/2021   Procedure: CYSTOSCOPY WITH RETROGRADE PYELOGRAM/URETERAL STENT PLACEMENT;  Surgeon: Ardis Hughs, MD;  Location: [facility];  Service: Urology;  Laterality: Left;   CYSTOSCOPY/URETEROSCOPY/HOLMIUM LASER/STENT PLACEMENT Left 01/19/2022   Procedure: LEFT URETEROSCOPY/STENT EXCHANGE/BASKET STONE REMOVAL;  Surgeon: Ardis Hughs, MD;  Location: [facility];  Service: Urology;  Laterality: Left;   MULTIPLE TOOTH EXTRACTIONS     OOPHORECTOMY     left ovary   POLYPECTOMY     TUBAL  LIGATION     UMBILICAL HERNIA REPAIR     UPPER GASTROINTESTINAL ENDOSCOPY  16-Oct-2006   VAGINAL HYSTERECTOMY     WISDOM TOOTH EXTRACTION      Social History   Socioeconomic History   Marital status: Widowed    Spouse name: Not on file   Number of children: 2   Years of education: 21   Highest education level: Not on file  Occupational History   Occupation: Scientist, product/process development: [employer]  Tobacco Use   Smoking status: Every Day    Packs/day: 0.25    Years: 30.00    Total pack years: 7.50    Types: Cigarettes   Smokeless tobacco: Never  Vaping Use   Vaping Use: Never  used  Substance and Sexual Activity   Alcohol use: No   Drug use: No   Sexual activity: Yes    Birth control/protection: Surgical  Other Topics Concern   Not on file  Social History Narrative   09/12/19   From: [place] > [place] > [place] as a teenager   Living: Pt lives in [place] with Jennifer [name] 2.dogs. Husband died in 10/17/15, she was married for 23 yrs.   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             Social Determinants of Health   Financial Resource Strain: Not on file  Food Insecurity: Not on file  Transportation Needs: No Transportation Needs (02/02/2022)   PRAPARE - Hydrologist (Medical): No    Lack of Transportation (Non-Medical): No  Physical Activity: Not on file  Stress: Not on file  Social Connections: Not on file  Intimate Partner Violence: Not on file    Family History  Problem Relation Age of Onset   Nephrolithiasis Mother    Skin cancer Mother        from radiation tx   Hyperlipidemia Mother    Squamous cell carcinoma Mother    CVA Father 66   Fibroids Sister    Other Sister        breast cyst   Skin cancer Sister    Hernia Maternal Grandmother    Pancreatic cancer Maternal Grandfather    Alcohol abuse Paternal Grandmother    Depression Paternal Grandmother    Suicidality Paternal Aunt    Depression Paternal Aunt    Non-Hodgkin's lymphoma Maternal Great-grandmother    Irritable bowel syndrome Other     Anesthesia problems Neg Hx    Hypotension Neg Hx    Malignant hyperthermia Neg Hx    Pseudochol deficiency Neg Hx    Colon cancer Neg Hx    Colon polyps Neg Hx    Esophageal cancer Neg Hx    Stomach cancer Neg Hx    Rectal cancer Neg Hx  Allergies  Allergen Reactions   Ceftriaxone Rash    Significant full body rash,  Tolerated cefepime 5/22   Nitrofurantoin Monohyd Macro Nausea And Vomiting    Body aches   Sulfa Antibiotics Nausea Only and Rash    Fever and disorientation   I? Current Outpatient Medications  Medication Sig Dispense Refill   acetaminophen (TYLENOL) 325 MG tablet Take 650 mg by mouth every 6 (six) hours as needed for moderate pain.     albuterol (VENTOLIN HFA) 108 (90 Base) MCG/ACT inhaler Inhale 1-2 puffs into the lungs every 6 (six) hours as needed for wheezing or shortness of breath. 6.7 g 0   ASPIRIN LOW DOSE 81 MG EC tablet TAKE ONE TABLET BY MOUTH DAILY 90 tablet 3   atorvastatin (LIPITOR) 10 MG tablet TAKE 1 TABLET BY MOUTH DAILY **NEEDS APPOINTMENT** 90 tablet 3   bisacodyl (DULCOLAX) 5 MG EC tablet Take 15 mg by mouth daily as needed for moderate constipation.     clindamycin (CLINDAGEL) 1 % gel Apply 1 application. topically daily as needed (For lips).     clonazePAM (KLONOPIN) 1 MG tablet Take 1 tablet (1 mg total) by mouth 2 (two) times daily as needed for anxiety. 60 tablet 0   ipratropium (ATROVENT) 0.06 % nasal spray Place 1 spray into both nostrils daily as needed for rhinitis.     Multiple Vitamin (MULTIVITAMIN) capsule Take 1 capsule by mouth daily.     oxybutynin (DITROPAN-XL) 5 MG 24 hr tablet Take 5 mg by mouth at bedtime.     PARoxetine (PAXIL) 40 MG tablet Take 1.5 tablets (60 mg total) by mouth every morning. 135 tablet 0   potassium chloride 20 MEQ/15ML (10%) SOLN Take 15 mLs (20 mEq total) by mouth daily. 473 mL 0   Probiotic Product (UP4 PROBIOTICS ADULT PO) Take by mouth daily.     promethazine (PHENERGAN) 25 MG tablet Take 1 tablet (25  mg total) by mouth every 6 (six) hours as needed for nausea or vomiting. 30 tablet 0   sodium fluoride (FLUORISHIELD) 1.1 % GEL dental gel Place 1 application  onto teeth every other day.     valACYclovir (VALTREX) 1000 MG tablet Take 1,000 mg by mouth daily as needed (for fever blisters).     No current facility-administered medications for this visit.     Abtx:  Anti-infectives (From admission, onward)    None       REVIEW OF SYSTEMS:  Const: negative fever, negative chills, > 20 pound weight loss Eyes: negative diplopia or visual changes, negative eye pain ENT: negative coryza, negative sore throat Resp: negative cough, hemoptysis, dyspnea- smoker Cards: negative for chest pain, palpitations, lower extremity edema GU: negative for frequency, dysuria and hematuria GI: Negative for abdominal pain, diarrhea, bleeding, constipation Skin: negative for rash and pruritus Heme: negative for easy bruising and gum/nose bleeding MS: generalize weakness Neurolo:negative for headaches, dizziness, vertigo, memory problems  Psych: anxiety Endocrine: negative for thyroid, diabetes Allergy/Immunology- as above Objective:  VITALS:  BP 127/85   Pulse 60   Temp (!) 97.1 F (36.2 C) (Temporal)    PHYSICAL EXAM:  General: Alert, cooperative, no distress, appears stated age.  Head: Normocephalic, without obvious abnormality, atraumatic. Eyes: Conjunctivae clear, anicteric sclerae. Pupils are equal ENT Nares normal. No drainage or sinus tenderness. Lungs: Clear to auscultation bilaterally. No Wheezing or Rhonchi. No rales. Heart: Regular rate and rhythm, no murmur, rub or gallop. Abdomen: Soft,  Neurologic: Grossly non-focal Pertinent Labs Lab Results CBC  Component Value Date/Time   WBC 10.2 03/30/2022 1633   RBC 4.70 03/30/2022 1633   HGB 14.3 03/30/2022 1633   HGB 15.6 06/28/2021 1604   HCT 42.6 03/30/2022 1633   HCT 45.2 06/28/2021 1604   PLT 263.0 03/30/2022 1633   PLT 334  06/28/2021 1604   MCV 90.5 03/30/2022 1633   MCV 86 06/28/2021 1604   MCH 29.8 02/02/2022 1201   MCHC 33.5 03/30/2022 1633   RDW 13.9 03/30/2022 1633   RDW 14.1 06/28/2021 1604   LYMPHSABS 3.9 02/02/2022 1201   MONOABS 1.0 02/02/2022 1201   EOSABS 0.6 (H) 02/02/2022 1201   BASOSABS 0.1 02/02/2022 1201       Latest Ref Rng & Units 04/18/2022    3:33 PM 03/30/2022    4:33 PM 03/04/2022   10:22 AM  CMP  Glucose 70 - 99 mg/dL  84    BUN 6 - 23 mg/dL  7    Creatinine 0.40 - 1.20 mg/dL  0.74    Sodium 135 - 145 mEq/L  140    Potassium 3.5 - 5.1 mEq/L 3.2  3.2  2.9   Chloride 96 - 112 mEq/L  107    CO2 19 - 32 mEq/L  22    Calcium 8.4 - 10.5 mg/dL  8.8    Total Protein 6.0 - 8.3 g/dL  6.2    Total Bilirubin 0.2 - 1.2 mg/dL  0.3    Alkaline Phos 39 - 117 U/L  67    AST 0 - 37 U/L  18    ALT 0 - 35 U/L  14       ? Impression/Recommendation  Recurrent Cdiff diarrhea- Now precipitated by antibiotic given for just dysuria  will do vanco taper If this does not work she will need FMT  ( would refer to Dr.Anna-GI)  ? Avoid testing urine and giving antibiotics for just dysuria, cluody urine or smelly urine Pt has been taught to recognize Genitourinary symptoms of menopause ?  Hypokalemia on supplement- follow up with PCP  H/o E.coli bacteremia and sepsis due to complicated UTI resolved  B/l renal stones- left ureteric stone.stent and now stone removed and stent removed as well  Depression on lithium and paxil  Leucocytosis has resolved Seen Dr.Bramandy- no bone marrow biopsy for now  Discussed the management with patient and Jennifer [name] mother Instructions to patient  Recurrent Cdiff- we will do vancomycin taper-  256m every 6 hours for 2 weeks 125 mg every 8 hours for 2 weeks 125 mg every 12 hours  for 2 weeks 125 mg once a day for 1 week And [id number] other day for 1 week And 125 mg twice a week for 1 week  The antibiotics you took for UTI caused Cdiff   If you just  have burning or twinge or cloudy urine alone there is no need to test your urine or treat with antibiotics If there is fever or flank pain associated with the above symptoms then you need to treat as it could be a sign of kidney infection or stone blocking   People who are menopausal can have genitourinary symptoms like burning while passing urine, or pain in the vagina or dryness  and the following can prevent that   *Estrace /Premarin cream topically- peasized apply topically three times a week * Cetaphil to clean the genital area ( not soap)  * Cranberry supplement (-Knudsen cranberry concentrate- 1 ounce mixed with 8 ounces of water *wash with water  after bowel movt *Probiotic for vaginal health( can try Pearls vaginal health) * Increase water consumption- 8 glasses a day *Ask your doctors not to check your urine on a routine basis  *Avoid antibiotics unless systemic infection/ or before cystoscopy * Kegel Exercise to strengthen pelvic floor   ? ___________________________________________________ Discussed with patientand Jennifer [name] mom Follow PRN Note:  This document was prepared using Dragon voice recognition software and may include unintentional dictation errors.

## 2022-05-02 ENCOUNTER — Other Ambulatory Visit: Payer: Self-pay | Admitting: Infectious Diseases

## 2022-05-02 DIAGNOSIS — A0472 Enterocolitis due to Clostridium difficile, not specified as recurrent: Secondary | ICD-10-CM

## 2022-05-02 NOTE — Progress Notes (Signed)
Pt is continuing to have diarrhea after initial improvement on vanco- day 11- will refer to GI Dr.Anna for FMT consideration

## 2022-05-03 ENCOUNTER — Telehealth: Payer: Self-pay

## 2022-05-03 NOTE — Telephone Encounter (Signed)
Contacted patient but couldn't leave her a voicemail on her cell phone. Therefore, I called her home phone and I had to leave her a voicemail letting her know that Dr. Delaine Lame want Dr. Vicente Males to see her. I stated that she was going to be scheduled for this Thursday 05/05/2022 at 10:15 AM.

## 2022-05-04 ENCOUNTER — Other Ambulatory Visit: Payer: Self-pay

## 2022-05-05 ENCOUNTER — Encounter: Payer: Self-pay | Admitting: Gastroenterology

## 2022-05-05 ENCOUNTER — Ambulatory Visit (INDEPENDENT_AMBULATORY_CARE_PROVIDER_SITE_OTHER): Payer: Managed Care, Other (non HMO) | Admitting: Gastroenterology

## 2022-05-05 VITALS — BP 112/80 | HR 64 | Temp 98.3°F | Ht 68.0 in | Wt 196.0 lb

## 2022-05-05 DIAGNOSIS — A0471 Enterocolitis due to Clostridium difficile, recurrent: Secondary | ICD-10-CM | POA: Diagnosis not present

## 2022-05-05 NOTE — Progress Notes (Signed)
Jonathon Bellows MD, MRCP(U.K) [address]  [place]  [place], [place] [address]  Main: [phone]  Fax: [phone]   Gastroenterology Consultation  Referring Provider:    Dr. Delaine Lame Primary Care Physician:  [name], MD Primary Gastroenterologist:  Dr. Jonathon Bellows  Reason for Consultation:    Recurrent C diff diarrhea         HPI:   Jennifer [name] is a 56 y.o. y/o female referred for recurrent C. difficile diarrhea.  In May 2023 had E. coli bacteremia and sepsis with complicated UTI due to her ureteric stone with hydronephrosis .  Still IV antibiotics developed diarrhea after that in June 2023 and she was positive for C. difficile and was given 10 days of Dificid.  On 03/11/2022 tested positive again for C. difficile diarrhea and given vancomycin but subsequently despite improvement she took Keflex and subsequently had worsening of diarrhea having 5-6 episodes per day.  She has been given a vancomycin taper.  Taking Vancomycin QID still having diarrhea.    Past Medical History:  Diagnosis Date   Anxiety    Cancer ([place])    Depression    Diverticulosis    Fundic gland polyps of stomach, benign    History of kidney stones    Low HDL (under 40)    Personal history of adenomatous colonic polyps 05/08/2012   Rosacea    Tenosynovitis, de Quervain    Urosepsis 10/2011   after stent    Past Surgical History:  Procedure Laterality Date   CERVICAL BIOPSY  W/ LOOP ELECTRODE EXCISION  04/2003   COLONOSCOPY     CYSTOSCOPY W/ URETERAL STENT PLACEMENT  10/14/2011   Procedure: CYSTOSCOPY WITH RETROGRADE PYELOGRAM/URETERAL STENT PLACEMENT;  Surgeon: Hanley Ben, MD;  Location: [facility];  Service: Urology;  Laterality: Right;  Right Double J Stent   CYSTOSCOPY W/ URETERAL STENT PLACEMENT Left 12/26/2021   Procedure: CYSTOSCOPY WITH RETROGRADE PYELOGRAM/URETERAL STENT PLACEMENT;  Surgeon: Ardis Hughs, MD;  Location: [facility];  Service: Urology;  Laterality: Left;    CYSTOSCOPY/URETEROSCOPY/HOLMIUM LASER/STENT PLACEMENT Left 01/19/2022   Procedure: LEFT URETEROSCOPY/STENT EXCHANGE/BASKET STONE REMOVAL;  Surgeon: Ardis Hughs, MD;  Location: [facility];  Service: Urology;  Laterality: Left;   MULTIPLE TOOTH EXTRACTIONS     OOPHORECTOMY     left ovary   POLYPECTOMY     TUBAL LIGATION     UMBILICAL HERNIA REPAIR     UPPER GASTROINTESTINAL ENDOSCOPY  2008   VAGINAL HYSTERECTOMY     WISDOM TOOTH EXTRACTION      Prior to Admission medications   Medication Sig Start Date End Date Taking? Authorizing Provider  acetaminophen (TYLENOL) 325 MG tablet Take 650 mg by mouth every 6 (six) hours as needed for moderate pain.    [provider]  albuterol (VENTOLIN HFA) 108 (90 Base) MCG/ACT inhaler Inhale 1-2 puffs into the lungs every 6 (six) hours as needed for wheezing or shortness of breath. 01/06/22   [name], MD  ASPIRIN LOW DOSE 81 MG EC tablet TAKE ONE TABLET BY MOUTH DAILY 12/09/20   [name], MD  atorvastatin (LIPITOR) 10 MG tablet TAKE 1 TABLET BY MOUTH DAILY **NEEDS APPOINTMENT** 03/14/22   [name], MD  bisacodyl (DULCOLAX) 5 MG EC tablet Take 15 mg by mouth daily as needed for moderate constipation.    [provider]  clindamycin (CLINDAGEL) 1 % gel Apply 1 application. topically daily as needed (For lips).  [provider]  clonazePAM (KLONOPIN) 1 MG tablet Take 1 tablet (1 mg total) by mouth 2 (two) times daily as needed for anxiety. 04/14/22   [name], MD  ipratropium (ATROVENT) 0.06 % nasal spray Place 1 spray into both nostrils daily as needed for rhinitis.    [provider]  Multiple Vitamin (MULTIVITAMIN) capsule Take 1 capsule by mouth daily.    [provider]  oxybutynin (DITROPAN-XL) 5 MG 24 hr tablet Take 5 mg by mouth at bedtime.    [provider]  PARoxetine (PAXIL) 40 MG tablet Take 1.5 tablets (60 mg total) by mouth every morning. 02/17/22   [name], MD   potassium chloride 20 MEQ/15ML (10%) SOLN Take 15 mLs (20 mEq total) by mouth daily. 04/14/22   [name], MD  Probiotic Product (UP4 PROBIOTICS ADULT PO) Take by mouth daily.    [provider]  promethazine (PHENERGAN) 25 MG tablet Take 1 tablet (25 mg total) by mouth every 6 (six) hours as needed for nausea or vomiting. 01/24/22   Deno Etienne, DO  sodium fluoride (FLUORISHIELD) 1.1 % GEL dental gel Place 1 application  onto teeth every other day.    [provider]  valACYclovir (VALTREX) 1000 MG tablet Take 1,000 mg by mouth daily as needed (for fever blisters). 08/24/17   [provider]  Vancomycin HCl 250 MG/5ML SOLR Take 250 mg by mouth every 6 (six) hours for 14 days. 04/21/22 05/05/22  Tsosie Billing, MD    Family History  Problem Relation Age of Onset   Nephrolithiasis Mother    Skin cancer Mother        from radiation tx   Hyperlipidemia Mother    Squamous cell carcinoma Mother    CVA Father 13   Fibroids Sister    Other Sister        breast cyst   Skin cancer Sister    Hernia Maternal Grandmother    Pancreatic cancer Maternal Grandfather    Alcohol abuse Paternal Grandmother    Depression Paternal Grandmother    Suicidality Paternal Aunt    Depression Paternal Aunt    Non-Hodgkin's lymphoma Maternal Great-grandmother    Irritable bowel syndrome Other    Anesthesia problems Neg Hx    Hypotension Neg Hx    Malignant hyperthermia Neg Hx    Pseudochol deficiency Neg Hx    Colon cancer Neg Hx    Colon polyps Neg Hx    Esophageal cancer Neg Hx    Stomach cancer Neg Hx    Rectal cancer Neg Hx      Social History   Tobacco Use   Smoking status: Every Day    Packs/day: 0.25    Years: 30.00    Total pack years: 7.50    Types: Cigarettes   Smokeless tobacco: Never  Vaping Use   Vaping Use: Never used  Substance Use Topics   Alcohol use: No   Drug use: No    Allergies as of 05/05/2022 - Review Complete 05/05/2022  Allergen  Reaction Noted   Ceftriaxone Rash 12/28/2021   Nitrofurantoin monohyd macro Nausea And Vomiting 10/14/2011   Sulfa antibiotics Nausea Only and Rash 10/15/2014    Review of Systems:    All systems reviewed and negative except where noted in HPI.   Physical Exam:  BP 112/80   Pulse 64   Temp 98.3 F (36.8 C) (Oral)   Ht '5\' 8"'$  (1.727 m)   Wt 196 lb (88.9 kg)  BMI 29.80 kg/m  No LMP recorded. Patient has had a hysterectomy. Psych:  Alert and cooperative. Normal mood and affect. General:   Alert,  Well-developed, well-nourished, pleasant and cooperative in NAD Head:  Normocephalic and atraumatic. Eyes:  Sclera clear, no icterus.   Conjunctiva pink. Neurologic:  Alert and oriented x3;  grossly normal neurologically. Psych:  Alert and cooperative. Normal mood and affect.  Imaging Studies: No results found.  Assessment and Plan:   Jennifer [name] is a 56 y.o. y/o female has been referred for recurrence of C. difficile diarrhea with a background of recurrent use of antibiotics for urinary tract infections.  The FDA has approved a stool pill to prevent recurrence of C. difficile diarrhea in the future.   Plan  Vancomycin continue taper once she gets the stool pills to stop 96 hours prior to commencement of the stool pills. Vowst(stool pill) four capsules taken once a day, orally, for three consecutive days after vancomycin has been held for 96 hours.  This will probably take a few weeks to get authorized to go through the insurance .  If there is an issue obtaining the stool pill then we will try Rebyota which is the stool enema Reiterated the need to avoid other antibiotics unless absolutely definitely needed.  Follow up in 4 weeks video visit  Dr Jonathon Bellows MD,MRCP(U.K)

## 2022-05-05 NOTE — Patient Instructions (Addendum)
[facility] Address: [address], [place], [place] [address]  I will be getting in contact with you when I get your Vowst approved.  Please remember to hold your Vancomycin 96 hours prior to starting Vowst.  You will take Vowst the following way: Four capsules taken once a day by mouth, for three consecutive days. Then restart your Vancomycin the following day.

## 2022-05-10 ENCOUNTER — Other Ambulatory Visit: Payer: Self-pay | Admitting: Family Medicine

## 2022-05-10 DIAGNOSIS — E876 Hypokalemia: Secondary | ICD-10-CM

## 2022-05-10 LAB — GI PROFILE, STOOL, PCR

## 2022-05-10 LAB — CALPROTECTIN, FECAL: Calprotectin, Fecal: <5 ug/g (ref 0–120)

## 2022-05-11 ENCOUNTER — Encounter: Payer: Self-pay | Admitting: Gastroenterology

## 2022-05-12 NOTE — Telephone Encounter (Signed)
Mychart sent to pt.

## 2022-05-12 NOTE — Telephone Encounter (Signed)
Please call patient to see if she is still having diarrhea.   If diarrhea has resolved we can recheck potassium. Otherwise she should continue

## 2022-05-13 DIAGNOSIS — Z9289 Personal history of other medical treatment: Secondary | ICD-10-CM | POA: Insufficient documentation

## 2022-05-16 ENCOUNTER — Telehealth: Payer: Self-pay

## 2022-05-16 NOTE — Telephone Encounter (Signed)
Called [facility] to start a prior-authorization for patient's new medication VOWST and they wanted [place] to fax them medical records before being able to approve the medication or not to [phone]. Then they will let us know if it was approved or not or if they need additional information.

## 2022-05-17 ENCOUNTER — Telehealth: Payer: Self-pay | Admitting: Gastroenterology

## 2022-05-17 NOTE — Telephone Encounter (Signed)
Per [facility] need pre auth for medication   Vowst   Please call Apolonio Schneiders at [phone]

## 2022-05-18 ENCOUNTER — Other Ambulatory Visit: Payer: Self-pay

## 2022-05-18 MED ORDER — VANCOMYCIN HCL 125 MG PO CAPS: 125.0000 mg | ORAL_CAPSULE | Freq: Every day | ORAL | 0 refills | Status: DC

## 2022-05-19 ENCOUNTER — Encounter: Payer: Self-pay | Admitting: Gastroenterology

## 2022-05-19 ENCOUNTER — Ambulatory Visit: Payer: Managed Care, Other (non HMO) | Admitting: Infectious Diseases

## 2022-05-20 ENCOUNTER — Encounter: Payer: Self-pay | Admitting: Gastroenterology

## 2022-05-25 ENCOUNTER — Encounter: Payer: Self-pay | Admitting: Gastroenterology

## 2022-05-27 ENCOUNTER — Encounter: Payer: Self-pay | Admitting: Gastroenterology

## 2022-06-02 ENCOUNTER — Telehealth (INDEPENDENT_AMBULATORY_CARE_PROVIDER_SITE_OTHER): Payer: Managed Care, Other (non HMO) | Admitting: Gastroenterology

## 2022-06-02 DIAGNOSIS — A0471 Enterocolitis due to Clostridium difficile, recurrent: Secondary | ICD-10-CM | POA: Diagnosis not present

## 2022-06-02 NOTE — Progress Notes (Signed)
Jennifer [name] , MD [address]  [place], [place] [address]  Main: [phone]  Fax: [phone]   Primary Care Physician: Waunita Schooner, MD  Virtual Visit via Video Note  I connected with patient on 06/02/22 at  3:15 PM EDT by video and verified that I am speaking with the correct person using two identifiers.   I discussed the limitations, risks, security and privacy concerns of performing an evaluation and management service by video  and the availability of in person appointments. I also discussed with the patient that there may be a patient responsible charge related to this service. The patient expressed understanding and agreed to proceed.  Location of Patient: Home Location of Provider: Home Persons involved: Patient and provider only   History of Present Illness: No chief complaint on file.   HPI: Jennifer [name] is a 56 y.o. female   Summary of history :  Initially referred and seen on 05/05/2022  for recurrent C. difficile diarrhea.  In May 2023 had E. coli bacteremia and sepsis with complicated UTI due to her ureteric stone with hydronephrosis .  Still IV antibiotics developed diarrhea after that in June 2023 and she was positive for C. difficile and was given 10 days of Dificid.   On 03/11/2022 tested positive again for C. difficile diarrhea and given vancomycin but subsequently despite improvement she took Keflex and subsequently had worsening of diarrhea having 5-6 episodes per day.  She has been given a vancomycin taper.   Taking Vancomycin QID still having diarrhea.   Interval history 05/05/2022-06/02/2022  She completed her third dose of Vowst stool pills today stools have firmed up having only 1 bowel movement a day.  She says that she has never felt better.  Not many antibiotics.   Current Outpatient Medications  Medication Sig Dispense Refill   acetaminophen (TYLENOL) 325 MG tablet Take 650 mg by mouth every 6 (six) hours as needed for  moderate pain.     albuterol (VENTOLIN HFA) 108 (90 Base) MCG/ACT inhaler Inhale 1-2 puffs into the lungs every 6 (six) hours as needed for wheezing or shortness of breath. 6.7 g 0   ASPIRIN LOW DOSE 81 MG EC tablet TAKE ONE TABLET BY MOUTH DAILY 90 tablet 3   atorvastatin (LIPITOR) 10 MG tablet TAKE 1 TABLET BY MOUTH DAILY **NEEDS APPOINTMENT** 90 tablet 3   clindamycin (CLINDAGEL) 1 % gel Apply 1 application. topically daily as needed (For lips).     clonazePAM (KLONOPIN) 1 MG tablet Take 1 tablet (1 mg total) by mouth 2 (two) times daily as needed for anxiety. 60 tablet 0   ipratropium (ATROVENT) 0.06 % nasal spray Place 1 spray into both nostrils daily as needed for rhinitis.     Multiple Vitamin (MULTIVITAMIN) capsule Take 1 capsule by mouth daily.     oxybutynin (DITROPAN-XL) 5 MG 24 hr tablet Take 5 mg by mouth at bedtime.     PARoxetine (PAXIL) 40 MG tablet Take 1.5 tablets (60 mg total) by mouth every morning. 135 tablet 0   potassium chloride 20 MEQ/15ML (10%) SOLN TAKE 15 MLS BY MOUTH DAILY 473 mL 0   Probiotic Product (UP4 PROBIOTICS ADULT PO) Take by mouth daily.     promethazine (PHENERGAN) 25 MG tablet Take 1 tablet (25 mg total) by mouth every 6 (six) hours as needed for nausea or vomiting. 30 tablet 0   sodium fluoride (FLUORISHIELD) 1.1 % GEL dental gel Place 1 application  onto  teeth every other day.     valACYclovir (VALTREX) 1000 MG tablet Take 1,000 mg by mouth daily as needed (for fever blisters).     vancomycin (VANCOCIN) 125 MG capsule Take 1 capsule (125 mg total) by mouth daily. 14 capsule 0   No current facility-administered medications for this visit.    Allergies as of 06/02/2022 - Review Complete 05/05/2022  Allergen Reaction Noted   Ceftriaxone Rash 12/28/2021   Nitrofurantoin monohyd macro Nausea And Vomiting 10/14/2011   Sulfa antibiotics Nausea Only and Rash 10/15/2014    Review of Systems:    All systems reviewed and negative except where noted in HPI.   General Appearance:    Alert, cooperative, no distress, appears stated age  Head:    Normocephalic, without obvious abnormality, atraumatic  Eyes:    PERRL, conjunctiva/corneas clear,  Ears:    Grossly normal hearing    Neurologic:  Grossly normal    Observations/Objective:  Labs: CMP     Component Value Date/Time   NA 140 03/30/2022 1633   NA 141 06/28/2021 1604   K 3.2 (L) 04/18/2022 1533   CL 107 03/30/2022 1633   CO2 22 03/30/2022 1633   GLUCOSE 84 03/30/2022 1633   BUN 7 03/30/2022 1633   BUN 8 06/28/2021 1604   CREATININE 0.74 03/30/2022 1633   CREATININE 0.85 01/28/2022 1506   CALCIUM 8.8 03/30/2022 1633   PROT 6.2 03/30/2022 1633   PROT 6.4 06/28/2021 1604   ALBUMIN 3.8 03/30/2022 1633   ALBUMIN 4.2 06/28/2021 1604   AST 18 03/30/2022 1633   ALT 14 03/30/2022 1633   ALKPHOS 67 03/30/2022 1633   BILITOT 0.3 03/30/2022 1633   BILITOT 0.3 06/28/2021 1604   GFRNONAA >60 02/23/2022 1044   GFRAA >60 08/10/2017 1122   Lab Results  Component Value Date   WBC 10.2 03/30/2022   HGB 14.3 03/30/2022   HCT 42.6 03/30/2022   MCV 90.5 03/30/2022   PLT 263.0 03/30/2022    Imaging Studies: No results found.  Assessment and Plan:   Jennifer [name] is a 56 y.o. y/o female here to follow up for recurrence of C. difficile diarrhea with a background of recurrent use of antibiotics for urinary tract infections.  She has received a course of Vopwst ( stool pills) which is known to help prevent recurrence of C. difficile diarrhea.  Presently her diarrhea has resolved and she feels back to her normal self.  Explained to her that the most important intervention for the future is to limit the use of antibiotics and use it only if there is a clear evidence of infection.  Discussed about avoiding artificial sugars and sweeteners in her diet.     Follow-up as needed    I discussed the assessment and treatment plan with the patient. The patient was provided an opportunity to ask  questions and all were answered. The patient agreed with the plan and demonstrated an understanding of the instructions.   The patient was advised to call back or seek an in-person evaluation if the symptoms worsen or if the condition fails to improve as anticipated.  I provided 15 minutes of face-to-face time during this encounter.  Dr Jennifer Bellows MD,MRCP [facility]) Gastroenterology/Hepatology Pager: [phone]   Speech recognition software was used to dictate this note.

## 2022-06-09 ENCOUNTER — Telehealth (HOSPITAL_COMMUNITY): Payer: 59 | Admitting: Psychiatry

## 2022-06-09 DIAGNOSIS — F332 Major depressive disorder, recurrent severe without psychotic features: Secondary | ICD-10-CM

## 2022-06-09 DIAGNOSIS — F411 Generalized anxiety disorder: Secondary | ICD-10-CM | POA: Diagnosis not present

## 2022-06-09 MED ORDER — CLONAZEPAM 1 MG PO TABS: 1.0000 mg | ORAL_TABLET | Freq: Two times a day (BID) | ORAL | 0 refills | Status: DC | PRN

## 2022-06-09 MED ORDER — PAROXETINE HCL 40 MG PO TABS: 60.0000 mg | ORAL_TABLET | ORAL | 0 refills | Status: DC

## 2022-06-09 NOTE — Progress Notes (Signed)
Virtual Visit via Video Note  I connected with Jennifer [name] on 06/09/22 at  3:30 PM EDT by a video enabled telemedicine application and verified that I am speaking with the correct person using two identifiers.  Location: Patient: home Provider: office   I discussed the limitations of evaluation and management by telemedicine and the availability of in person appointments. The patient expressed understanding and agreed to proceed.  History of Present Illness: Jennifer [name] shares that the last 2 weeks have been really good. She had to get a fecal implant in pill form. After completing it she started to feel much better. Overall  her health is 98% better. Her energy still gets low in the afternoon but she is still building up her stamina. Jennifer [name] is sleeping and eating well. Jennifer [name] denies depression and states the last time was a little over 2 weeks ago. She denies hopelessness. She denies SI/HI. Jennifer [name] shares her anxiety is a little elevated. She is nervous about getting sick again and about her diet. She is back to work full time and it is going well.    Observations/Objective: Psychiatric Specialty Exam: ROS  There were no vitals taken for this visit.There is no height or weight on file to calculate BMI.  General Appearance: Casual and Neat  Eye Contact:  Good  Speech:  Clear and Coherent and Normal Rate  Volume:  Normal  Mood:  Anxious  Affect:  Full Range  Thought Process:  Goal Directed, Linear, and Descriptions of Associations: Intact  Orientation:  Full (Time, Place, and Person)  Thought Content:  Logical  Suicidal Thoughts:  No  Homicidal Thoughts:  No  Memory:  Immediate;   Good  Judgement:  Good  Insight:  Good  Psychomotor Activity:  Normal  Concentration:  Concentration: Good  Recall:  Good  Fund of Knowledge:  Good  Language:  Good  Akathisia:  No  Handed:  Right  AIMS (if indicated):     Assets:  Communication Skills Desire for Improvement Financial  Resources/Insurance Housing Leisure Time Resilience Social Support Talents/Skills Transportation Vocational/Educational  ADL's:  Intact  Cognition:  WNL  Sleep:        Assessment and Plan:     06/09/2022    3:45 PM 04/21/2022    9:49 AM 04/14/2022    3:27 PM 03/24/2022    4:40 PM 02/24/2022   11:07 AM  Depression screen PHQ 2/9  Decreased Interest 0 0 0 1 2  Down, Depressed, Hopeless 0 1 0 2 2  PHQ - 2 Score 0 1 0 3 4  Altered sleeping 0  0 0 0  Tired, decreased energy '3  1 3 2  '$ Change in appetite 0  0 2 2  Feeling bad or failure about yourself  0  0 1 1  Trouble concentrating 0  0 0 0  Moving slowly or fidgety/restless 0  0 0 0  Suicidal thoughts 0  0 0   PHQ-9 Score '3  1 9 9  '$ Difficult doing work/chores Not difficult at all  Not difficult at all Somewhat difficult     Flowsheet Row Video Visit from 06/09/2022 in [facility] Video Visit from 04/14/2022 in [facility] Video Visit from 03/24/2022 in [facility]  C-SSRS RISK CATEGORY No Risk No Risk No Risk         Pt is aware that these meds carry a teratogenic risk. Pt will discuss plan of action if  she does or plans to become pregnant in the future.  Status of current problems: stable mood, ongoing anxiety  Meds:  1. Severe episode of recurrent major depressive disorder, without psychotic features ([place]) - PARoxetine (PAXIL) 40 MG tablet; Take 1.5 tablets (60 mg total) by mouth every morning.  Dispense: 135 tablet; Refill: 0  2. GAD (generalized anxiety disorder) - clonazePAM (KLONOPIN) 1 MG tablet; Take 1 tablet (1 mg total) by mouth 2 (two) times daily as needed for anxiety.  Dispense: 60 tablet; Refill: 0 - PARoxetine (PAXIL) 40 MG tablet; Take 1.5 tablets (60 mg total) by mouth every morning.  Dispense: 135 tablet; Refill: 0     Labs: none    Therapy: brief supportive therapy provided.  Discussed psychosocial stressors in detail.      Collaboration of Care: Other none  Patient/Guardian was advised Release of Information must be obtained prior to any record release in order to collaborate their care with an outside provider. Patient/Guardian was advised if they have not already done so to contact the registration department to sign all necessary forms in order for [place] to release information regarding their care.   Consent: Patient/Guardian gives verbal consent for treatment and assignment of benefits for services provided during this visit. Patient/Guardian expressed understanding and agreed to proceed.     Follow Up Instructions: Follow up in 2-3 months or sooner if needed    I discussed the assessment and treatment plan with the patient. The patient was provided an opportunity to ask questions and all were answered. The patient agreed with the plan and demonstrated an understanding of the instructions.   The patient was advised to call back or seek an in-person evaluation if the symptoms worsen or if the condition fails to improve as anticipated.  I provided 15 minutes of non-face-to-face time during this encounter.   [name], MD

## 2022-06-27 ENCOUNTER — Telehealth: Payer: Self-pay | Admitting: Family Medicine

## 2022-06-27 ENCOUNTER — Inpatient Hospital Stay (HOSPITAL_BASED_OUTPATIENT_CLINIC_OR_DEPARTMENT_OTHER): Payer: Managed Care, Other (non HMO) | Admitting: Internal Medicine

## 2022-06-27 ENCOUNTER — Encounter: Payer: Self-pay | Admitting: Internal Medicine

## 2022-06-27 ENCOUNTER — Inpatient Hospital Stay: Payer: Managed Care, Other (non HMO) | Attending: Internal Medicine

## 2022-06-27 VITALS — BP 140/59 | HR 65 | Temp 96.8°F | Resp 20 | Wt 199.0 lb

## 2022-06-27 DIAGNOSIS — D72820 Lymphocytosis (symptomatic): Secondary | ICD-10-CM | POA: Insufficient documentation

## 2022-06-27 DIAGNOSIS — Z800 Family history of malignant neoplasm of digestive organs: Secondary | ICD-10-CM | POA: Insufficient documentation

## 2022-06-27 DIAGNOSIS — D72821 Monocytosis (symptomatic): Secondary | ICD-10-CM | POA: Insufficient documentation

## 2022-06-27 DIAGNOSIS — D72829 Elevated white blood cell count, unspecified: Secondary | ICD-10-CM | POA: Diagnosis present

## 2022-06-27 DIAGNOSIS — F319 Bipolar disorder, unspecified: Secondary | ICD-10-CM | POA: Insufficient documentation

## 2022-06-27 DIAGNOSIS — Z807 Family history of other malignant neoplasms of lymphoid, hematopoietic and related tissues: Secondary | ICD-10-CM | POA: Insufficient documentation

## 2022-06-27 DIAGNOSIS — D72828 Other elevated white blood cell count: Secondary | ICD-10-CM

## 2022-06-27 DIAGNOSIS — E876 Hypokalemia: Secondary | ICD-10-CM | POA: Insufficient documentation

## 2022-06-27 DIAGNOSIS — F17210 Nicotine dependence, cigarettes, uncomplicated: Secondary | ICD-10-CM | POA: Insufficient documentation

## 2022-06-27 LAB — CBC WITH DIFFERENTIAL/PLATELET
Abs Immature Granulocytes: 0.03 10*3/uL (ref 0.00–0.07)
Basophils Absolute: 0 10*3/uL (ref 0.0–0.1)
Basophils Relative: 0 %
Eosinophils Absolute: 0.2 10*3/uL (ref 0.0–0.5)
Eosinophils Relative: 2 %
HCT: 46.1 % — ABNORMAL HIGH (ref 36.0–46.0)
Hemoglobin: 15.3 g/dL — ABNORMAL HIGH (ref 12.0–15.0)
Immature Granulocytes: 0 %
Lymphocytes Relative: 43 %
Lymphs Abs: 4.2 10*3/uL — ABNORMAL HIGH (ref 0.7–4.0)
MCH: 28.3 pg (ref 26.0–34.0)
MCHC: 33.2 g/dL (ref 30.0–36.0)
MCV: 85.2 fL (ref 80.0–100.0)
Monocytes Absolute: 0.5 10*3/uL (ref 0.1–1.0)
Monocytes Relative: 5 %
Neutro Abs: 4.7 10*3/uL (ref 1.7–7.7)
Neutrophils Relative %: 50 %
Platelets: 299 10*3/uL (ref 150–400)
RBC: 5.41 MIL/uL — ABNORMAL HIGH (ref 3.87–5.11)
RDW: 13.3 % (ref 11.5–15.5)
WBC: 9.6 10*3/uL (ref 4.0–10.5)
nRBC: 0 % (ref 0.0–0.2)

## 2022-06-27 LAB — COMPREHENSIVE METABOLIC PANEL
ALT: 14 U/L (ref 0–44)
AST: 19 U/L (ref 15–41)
Albumin: 4.1 g/dL (ref 3.5–5.0)
Alkaline Phosphatase: 78 U/L (ref 38–126)
Anion gap: 7 (ref 5–15)
BUN: 8 mg/dL (ref 6–20)
CO2: 28 mmol/L (ref 22–32)
Calcium: 8.8 mg/dL — ABNORMAL LOW (ref 8.9–10.3)
Chloride: 102 mmol/L (ref 98–111)
Creatinine, Ser: 0.8 mg/dL (ref 0.44–1.00)
GFR, Estimated: >60 mL/min (ref >60)
Glucose, Bld: 140 mg/dL — ABNORMAL HIGH (ref 70–99)
Potassium: 2.3 mmol/L — CL (ref 3.5–5.1)
Sodium: 137 mmol/L (ref 135–145)
Total Bilirubin: 0.4 mg/dL (ref 0.3–1.2)
Total Protein: 7.4 g/dL (ref 6.5–8.1)

## 2022-06-27 LAB — LACTATE DEHYDROGENASE: LDH: 120 U/L (ref 98–192)

## 2022-06-27 MED ORDER — POTASSIUM CHLORIDE 20 MEQ/15ML (10%) PO SOLN: 20.0000 meq | Freq: Two times a day (BID) | ORAL | 0 refills | Status: DC

## 2022-06-27 NOTE — Telephone Encounter (Signed)
Pt called stating she was seen by other provider for lab work & was told her potassium was low. Pt wanted to know why haven't we refilled her potassium? Pt stated the pharmacy sent over a refill request. I told pt how there wasn't any refill request sent over to our office & how she needed to schedule toc with a new provider. Pt stated she thought [facility] was her new pcp due to seeing back on 04/19/22. I told pt that was just for an acute issue not toc. Pt stated she wasn't happy with our office & she felt as if we're "sweeping her under the rug". I see that another provider refilled in her potassium meds today, 06/27/22

## 2022-06-27 NOTE — Progress Notes (Signed)
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 likley related to Lithium.  predominant neutrophilia; lymphocytosis; monocytosis  Hb- 16; platelets- 436 [LL-400];   # JUNE 2023- peripheral blood flow cytometry- non-specific ; BCR ABL FISH-NEGATIVGE; JAK2 mutation CALR/MPL-NEGATIVE. Discussed regarding bone marrow biopsy- given continued leucocytosis However, hold bone marrow biopsy at this time.  #Bipolar disorder/Depression-on lithium [Dr.Agarwal; [facility]]  1. Obstructing 6 mm calculus in the proximal third of the left ureter resulting in mild left hydroureteronephrosis. 2. Findings suggestive of hepatic steatosis.   Aortic Atherosclerosis (ICD10-I70.0).  Oncology History   No history exists.     HPI: with mother; ambulating independently.  Jennifer [name] 56 y.o.  female pleasant patient depression/bipolar currently here for follow-up to review the results of her blood work for elevated white count.  Patient had fecal transplant for recurrent C. difficile 4 weeks ago.  She is feeling very good.  Denies any diarrhea.  She was also taken off lithium few weeks ago due to tremors.  Tremors have resolved now.  Review of Systems  Constitutional:  Negative for chills, diaphoresis, fever and weight loss.  HENT:  Negative for nosebleeds and sore throat.   Eyes:  Negative for double vision.  Respiratory:  Negative for cough, hemoptysis, sputum production, shortness of breath and wheezing.   Cardiovascular:  Negative for chest pain, palpitations, orthopnea and leg swelling.  Gastrointestinal:  Negative for blood in stool, constipation, diarrhea, heartburn and melena.   Genitourinary:  Negative for dysuria, frequency and urgency.  Skin: Negative.  Negative for itching and rash.  Neurological:  Positive for dizziness. Negative for tingling, focal weakness and headaches.  Endo/Heme/Allergies:  Does not bruise/bleed easily.  Psychiatric/Behavioral:  Negative for depression. The patient is not nervous/anxious and does not have insomnia.       PAST MEDICAL HISTORY :  Past Medical History:  Diagnosis Date   Anxiety    Cancer ([place])    Depression    Diverticulosis    Fundic gland polyps of stomach, benign    History of kidney stones    Low HDL (under 40)    Personal history of adenomatous colonic polyps 05/08/2012   Rosacea    Tenosynovitis, de Quervain    Urosepsis 10/2011   after stent    PAST SURGICAL HISTORY :   Past Surgical History:  Procedure Laterality Date   CERVICAL BIOPSY  W/ LOOP ELECTRODE EXCISION  04/2003   COLONOSCOPY     CYSTOSCOPY W/ URETERAL STENT PLACEMENT  10/14/2011   Procedure: CYSTOSCOPY WITH RETROGRADE PYELOGRAM/URETERAL STENT PLACEMENT;  Surgeon: Hanley Ben, MD;  Location: [facility];  Service: Urology;  Laterality: Right;  Right Double J Stent   CYSTOSCOPY W/ URETERAL STENT PLACEMENT Left 12/26/2021   Procedure: CYSTOSCOPY WITH RETROGRADE PYELOGRAM/URETERAL STENT PLACEMENT;  Surgeon: Ardis Hughs, MD;  Location: [facility];  Service: Urology;  Laterality: Left;   CYSTOSCOPY/URETEROSCOPY/HOLMIUM LASER/STENT PLACEMENT Left 01/19/2022   Procedure: LEFT URETEROSCOPY/STENT EXCHANGE/BASKET STONE REMOVAL;  Surgeon: Ardis Hughs, MD;  Location: [facility];  Service: Urology;  Laterality: Left;   MULTIPLE TOOTH EXTRACTIONS     OOPHORECTOMY     left ovary   POLYPECTOMY     TUBAL LIGATION     UMBILICAL HERNIA REPAIR     UPPER GASTROINTESTINAL  ENDOSCOPY  2008   VAGINAL HYSTERECTOMY     WISDOM TOOTH EXTRACTION      FAMILY HISTORY :   Family History  Problem Relation Age of Onset   Nephrolithiasis Mother    Skin cancer  Mother        from radiation tx   Hyperlipidemia Mother    Squamous cell carcinoma Mother    CVA Father 64   Fibroids Sister    Other Sister        breast cyst   Skin cancer Sister    Hernia Maternal Grandmother    Pancreatic cancer Maternal Grandfather    Alcohol abuse Paternal Grandmother    Depression Paternal Grandmother    Suicidality Paternal Aunt    Depression Paternal Aunt    Non-Hodgkin's lymphoma Maternal Great-grandmother    Irritable bowel syndrome Other    Anesthesia problems Neg Hx    Hypotension Neg Hx    Malignant hyperthermia Neg Hx    Pseudochol deficiency Neg Hx    Colon cancer Neg Hx    Colon polyps Neg Hx    Esophageal cancer Neg Hx    Stomach cancer Neg Hx    Rectal cancer Neg Hx     SOCIAL HISTORY:   Social History   Tobacco Use   Smoking status: Every Day    Packs/day: 0.25    Years: 30.00    Total pack years: 7.50    Types: Cigarettes   Smokeless tobacco: Never  Vaping Use   Vaping Use: Never used  Substance Use Topics   Alcohol use: No   Drug use: No    ALLERGIES:  is allergic to ceftriaxone, nitrofurantoin monohyd macro, and sulfa antibiotics.  MEDICATIONS:  Current Outpatient Medications  Medication Sig Dispense Refill   acetaminophen (TYLENOL) 325 MG tablet Take 650 mg by mouth every 6 (six) hours as needed for moderate pain.     albuterol (VENTOLIN HFA) 108 (90 Base) MCG/ACT inhaler Inhale 1-2 puffs into the lungs every 6 (six) hours as needed for wheezing or shortness of breath. 6.7 g 0   ASPIRIN LOW DOSE 81 MG EC tablet TAKE ONE TABLET BY MOUTH DAILY 90 tablet 3   atorvastatin (LIPITOR) 10 MG tablet TAKE 1 TABLET BY MOUTH DAILY **NEEDS APPOINTMENT** 90 tablet 3   clindamycin (CLINDAGEL) 1 % gel Apply 1 application. topically daily as needed (For lips).     clonazePAM (KLONOPIN) 1 MG tablet Take 1 tablet (1 mg total) by mouth 2 (two) times daily as needed for anxiety. 60 tablet 0   ipratropium (ATROVENT) 0.06 % nasal spray Place 1  spray into both nostrils daily as needed for rhinitis.     Multiple Vitamin (MULTIVITAMIN) capsule Take 1 capsule by mouth daily.     oxybutynin (DITROPAN-XL) 5 MG 24 hr tablet Take 5 mg by mouth at bedtime.     PARoxetine (PAXIL) 40 MG tablet Take 1.5 tablets (60 mg total) by mouth every morning. 135 tablet 0   Probiotic Product (UP4 PROBIOTICS ADULT PO) Take by mouth daily.     promethazine (PHENERGAN) 25 MG tablet Take 1 tablet (25 mg total) by mouth every 6 (six) hours as needed for nausea or vomiting. 30 tablet 0   sodium fluoride (FLUORISHIELD) 1.1 % GEL dental gel Place 1 application  onto teeth every other day.     valACYclovir (VALTREX) 1000 MG tablet Take 1,000 mg by mouth daily as needed (for fever blisters).     potassium chloride 20 MEQ/15ML (  10%) SOLN Take 15 mLs (20 mEq total) by mouth 2 (two) times daily. 473 mL 0   No current facility-administered medications for this visit.    PHYSICAL EXAMINATION:  BP (!) 140/59   Pulse 65   Temp (!) 96.8 F (36 C)   Resp 20   Wt 199 lb (90.3 kg)   SpO2 100%   BMI 30.26 kg/m   Filed Weights   06/27/22 1534  Weight: 199 lb (90.3 kg)    Physical Exam Vitals and nursing note reviewed.  HENT:     Head: Normocephalic and atraumatic.     Mouth/Throat:     Pharynx: Oropharynx is clear.  Eyes:     Extraocular Movements: Extraocular movements intact.     Pupils: Pupils are equal, round, and reactive to light.  Cardiovascular:     Rate and Rhythm: Normal rate and regular rhythm.  Pulmonary:     Comments: Decreased breath sounds bilaterally.  Abdominal:     Palpations: Abdomen is soft.  Musculoskeletal:        General: Normal range of motion.     Cervical back: Normal range of motion.  Skin:    General: Skin is warm.  Neurological:     General: No focal deficit present.     Mental Status: She is alert and oriented to person, place, and time.  Psychiatric:        Behavior: Behavior normal.        Judgment: Judgment  normal.        LABORATORY DATA:  I have reviewed the data as listed    Component Value Date/Time   NA 137 06/27/2022 1517   NA 141 06/28/2021 1604   K 2.3 (LL) 06/27/2022 1517   CL 102 06/27/2022 1517   CO2 28 06/27/2022 1517   GLUCOSE 140 (H) 06/27/2022 1517   BUN 8 06/27/2022 1517   BUN 8 06/28/2021 1604   CREATININE 0.80 06/27/2022 1517   CREATININE 0.85 01/28/2022 1506   CALCIUM 8.8 (L) 06/27/2022 1517   PROT 7.4 06/27/2022 1517   PROT 6.4 06/28/2021 1604   ALBUMIN 4.1 06/27/2022 1517   ALBUMIN 4.2 06/28/2021 1604   AST 19 06/27/2022 1517   ALT 14 06/27/2022 1517   ALKPHOS 78 06/27/2022 1517   BILITOT 0.4 06/27/2022 1517   BILITOT 0.3 06/28/2021 1604   GFRNONAA >60 06/27/2022 1517   GFRAA >60 08/10/2017 1122    No results found for: "SPEP", "UPEP"  Lab Results  Component Value Date   WBC 9.6 06/27/2022   NEUTROABS 4.7 06/27/2022   HGB 15.3 (H) 06/27/2022   HCT 46.1 (H) 06/27/2022   MCV 85.2 06/27/2022   PLT 299 06/27/2022      Chemistry      Component Value Date/Time   NA 137 06/27/2022 1517   NA 141 06/28/2021 1604   K 2.3 (LL) 06/27/2022 1517   CL 102 06/27/2022 1517   CO2 28 06/27/2022 1517   BUN 8 06/27/2022 1517   BUN 8 06/28/2021 1604   CREATININE 0.80 06/27/2022 1517   CREATININE 0.85 01/28/2022 1506   GLU 95 09/11/2020 0000      Component Value Date/Time   CALCIUM 8.8 (L) 06/27/2022 1517   ALKPHOS 78 06/27/2022 1517   AST 19 06/27/2022 1517   ALT 14 06/27/2022 1517   BILITOT 0.4 06/27/2022 1517   BILITOT 0.3 06/28/2021 1604       RADIOGRAPHIC STUDIES: I have personally reviewed the radiological images as listed and  agreed with the findings in the report. No results found.   ASSESSMENT & PLAN:  Acquired neutrophilia # LEUCOCYTOSIS/neutrophilia/lymphocytosis monocytosis-chronic [2010 mild]; May 2023-White count 50,000 [pyelonephritis/C.diff]-more recently t 20,000. likley related to Lithium.   CBC with differential was reviewed  today.  WBC has completely normalized.  She has been taken off lithium due to side effects of tremors by her psychiatrist.  Patient will follow up with Dr. B in 4 months to discuss about further need for follow-up visits.   # JUNE 2023- peripheral blood flow cytometry- non-specific ; BCR ABL FISH-NEGATIVGE; JAK2 mutation CALR/MPL-NEGATIVE.    # Hypokalemia: 2.3 today-difficulty with swallowing the pills.  Sent prescription for potassium liquid 20 mEq twice daily.  Patient will come back for repeat lab in 1 week.  Patient to follow-up with PCP also regarding her potassium.  # BiPolar [Dr.Agarwal;[facility]] lithium discontinued.    mychart-  # DISPOSITION: #Potassium in 1 week # follow up in 4 months-with Dr. B, labs   Orders Placed This Encounter  Procedures   Basic metabolic panel    Standing Status:   Future    Standing Expiration Date:   06/27/2023   CBC with Differential    Standing Status:   Future    Standing Expiration Date:   06/27/2023   All questions were answered. The patient knows to call the clinic with any problems, questions or concerns.      Jane Canary, MD 06/27/2022 4:36 PM

## 2022-06-28 NOTE — Telephone Encounter (Signed)
Please call patient to make [facility] appointment

## 2022-06-28 NOTE — Telephone Encounter (Signed)
LVM for tcb and set up TOC

## 2022-07-04 ENCOUNTER — Inpatient Hospital Stay: Payer: Managed Care, Other (non HMO)

## 2022-07-04 DIAGNOSIS — E876 Hypokalemia: Secondary | ICD-10-CM

## 2022-07-04 DIAGNOSIS — D72829 Elevated white blood cell count, unspecified: Secondary | ICD-10-CM | POA: Diagnosis not present

## 2022-07-04 LAB — BASIC METABOLIC PANEL
Anion gap: 8 (ref 5–15)
BUN: 9 mg/dL (ref 6–20)
CO2: 24 mmol/L (ref 22–32)
Calcium: 8.7 mg/dL — ABNORMAL LOW (ref 8.9–10.3)
Chloride: 105 mmol/L (ref 98–111)
Creatinine, Ser: 0.64 mg/dL (ref 0.44–1.00)
GFR, Estimated: >60 mL/min (ref >60)
Glucose, Bld: 119 mg/dL — ABNORMAL HIGH (ref 70–99)
Potassium: 3 mmol/L — ABNORMAL LOW (ref 3.5–5.1)
Sodium: 137 mmol/L (ref 135–145)

## 2022-07-05 ENCOUNTER — Other Ambulatory Visit (HOSPITAL_COMMUNITY): Payer: Self-pay

## 2022-07-05 ENCOUNTER — Telehealth: Payer: Self-pay | Admitting: Internal Medicine

## 2022-07-05 NOTE — Telephone Encounter (Signed)
Called patient regarding labs and left voicemail with call back number.  Potassium continues to be low but improved. From 2.3 to 3.0. Advised to continue taking K supplements and to reach out to her PCP so it can be closely monitored.

## 2022-07-05 NOTE — Telephone Encounter (Signed)
Per chart patient has TOC with [facility] 01/2023

## 2022-09-01 ENCOUNTER — Telehealth (HOSPITAL_COMMUNITY): Payer: 59 | Admitting: Psychiatry

## 2022-09-01 DIAGNOSIS — F411 Generalized anxiety disorder: Secondary | ICD-10-CM

## 2022-09-01 DIAGNOSIS — F4310 Post-traumatic stress disorder, unspecified: Secondary | ICD-10-CM

## 2022-09-01 DIAGNOSIS — F332 Major depressive disorder, recurrent severe without psychotic features: Secondary | ICD-10-CM

## 2022-09-01 MED ORDER — PAROXETINE HCL 40 MG PO TABS: 60.0000 mg | ORAL_TABLET | ORAL | 0 refills | Status: DC

## 2022-09-01 MED ORDER — CLONAZEPAM 1 MG PO TABS: 1.0000 mg | ORAL_TABLET | Freq: Two times a day (BID) | ORAL | 1 refills | Status: DC | PRN

## 2022-09-01 NOTE — Progress Notes (Signed)
Virtual Visit via Video Note  I connected with Jennifer [name] on 09/01/22 at  3:30 PM EST by  a video enabled telemedicine application and verified that I am speaking with the correct person using two identifiers.  Location: Patient: Home Provider: office   I discussed the limitations of evaluation and management by telemedicine and the availability of in person appointments. The patient expressed understanding and agreed to proceed.  History of Present Illness: Jennifer [name] shares she is doing well. Work is going ok. Her mood has been stable. Over Christmas she had a terrible dream about her ex-husband. Last year she another nightmares where she was screaming in her sleep. Over the past 2 years she has had 4 nightmares about him. She has intrusive memories several times a week.  She is usually able to talk herself out of it. The anniversary of his death is 2022/09/22. This will be 7 years since he passed.  Her mom thinks it is due to PTSD. She denies depression, anhedonia and isolation. She denies SI/HI. Jennifer [name] is taking 1/2 tab of Klonopin at bedtime. [name] usually gets about 8 hrs of sleep each night. She didn't take Klonopin for 3 nights and had very poor, broken sleep. Her dad's health is declining. It makes her worry.   Observations/Objective: Psychiatric Specialty Exam: ROS  There were no vitals taken for this visit.There is no height or weight on file to calculate BMI.  General Appearance: Casual  Eye Contact:  Good  Speech:  Clear and Coherent and Normal Rate  Volume:  Normal  Mood:  Euthymic  Affect:  Full Range  Thought Process:  Goal Directed, Linear, and Descriptions of Associations: Intact  Orientation:  Full (Time, Place, and Person)  Thought Content:  Logical  Suicidal Thoughts:  No  Homicidal Thoughts:  No  Memory:  Immediate;   Good  Judgement:  Good  Insight:  Good  Psychomotor Activity:  Normal  Concentration:  Concentration: Good  Recall:  Good  Fund of  Knowledge:  Good  Language:  Good  Akathisia:  No  Handed:  Right  AIMS (if indicated):     Assets:  Communication Skills Desire for Improvement Financial Resources/Insurance Housing Intimacy Leisure Time Resilience Social Support Talents/Skills Transportation Vocational/Educational  ADL's:  Intact  Cognition:  WNL  Sleep:        Assessment and Plan:     09/01/2022    3:50 PM 06/09/2022    3:45 PM 04/21/2022    9:49 AM 04/14/2022    3:27 PM 03/24/2022    4:40 PM  Depression screen PHQ 2/9  Decreased Interest 0 0 0 0 1  Down, Depressed, Hopeless 0 0 1 0 2  PHQ - 2 Score 0 0 1 0 3  Altered sleeping  0  0 0  Tired, decreased energy  '3  1 3  '$ Change in appetite  0  0 2  Feeling bad or failure about yourself   0  0 1  Trouble concentrating  0  0 0  Moving slowly or fidgety/restless  0  0 0  Suicidal thoughts  0  0 0  PHQ-9 Score  '3  1 9  '$ Difficult doing work/chores  Not difficult at all  Not difficult at all Somewhat difficult    Flowsheet Row Video Visit from 09/01/2022 in [facility] Video Visit from 06/09/2022 in [facility] Video Visit from 04/14/2022 in [facility]  CATEGORY No Risk No Risk No Risk          Pt is aware that these meds carry a teratogenic risk. Pt will discuss plan of action if she does or plans to become pregnant in the future.  Status of current problems: ongoing anxiety and PTSD, mood is stable   Medication management with supportive therapy. Risks and benefits, side effects and alternative treatment options discussed with patient. Pt was given an opportunity to ask questions about medication, illness, and treatment. All current psychiatric medications have been reviewed and discussed with the patient and adjusted as clinically appropriate.  Pt verbalized understanding and verbal consent obtained for treatment.  Meds:   1. PTSD (post-traumatic stress disorder) - PARoxetine (PAXIL) 40 MG tablet; Take 1.5 tablets (60 mg total) by mouth every morning.  Dispense: 135 tablet; Refill: 0  2. GAD (generalized anxiety disorder) - clonazePAM (KLONOPIN) 1 MG tablet; Take 1 tablet (1 mg total) by mouth 2 (two) times daily as needed for anxiety.  Dispense: 60 tablet; Refill: 1 - PARoxetine (PAXIL) 40 MG tablet; Take 1.5 tablets (60 mg total) by mouth every morning.  Dispense: 135 tablet; Refill: 0  3. Severe episode of recurrent major depressive disorder, without psychotic features ([place]) - PARoxetine (PAXIL) 40 MG tablet; Take 1.5 tablets (60 mg total) by mouth every morning.  Dispense: 135 tablet; Refill: 0     Labs: none    Therapy: brief supportive therapy provided. Discussed psychosocial stressors in detail    Collaboration of Care: Other none  Patient/Guardian was advised Release of Information must be obtained prior to any record release in order to collaborate their care with an outside provider. Patient/Guardian was advised if they have not already done so to contact the registration department to sign all necessary forms in order for [place] to release information regarding their care.   Consent: Patient/Guardian gives verbal consent for treatment and assignment of benefits for services provided during this visit. Patient/Guardian expressed understanding and agreed to proceed.      Follow Up Instructions: Follow up in 2-3 months or sooner if needed    I discussed the assessment and treatment plan with the patient. The patient was provided an opportunity to ask questions and all were answered. The patient agreed with the plan and demonstrated an understanding of the instructions.   The patient was advised to call back or seek an in-person evaluation if the symptoms worsen or if the condition fails to improve as anticipated.  I provided 19 minutes of non-face-to-face time during this encounter.   [name], MD

## 2022-11-01 ENCOUNTER — Inpatient Hospital Stay: Payer: Managed Care, Other (non HMO) | Admitting: Internal Medicine

## 2022-11-01 ENCOUNTER — Inpatient Hospital Stay: Payer: Managed Care, Other (non HMO)

## 2022-11-01 ENCOUNTER — Inpatient Hospital Stay: Payer: Managed Care, Other (non HMO) | Attending: Internal Medicine

## 2022-11-01 VITALS — BP 134/73 | HR 71 | Temp 98.1°F | Ht 66.5 in | Wt 199.8 lb

## 2022-11-01 DIAGNOSIS — D72829 Elevated white blood cell count, unspecified: Secondary | ICD-10-CM | POA: Insufficient documentation

## 2022-11-01 DIAGNOSIS — F17210 Nicotine dependence, cigarettes, uncomplicated: Secondary | ICD-10-CM | POA: Diagnosis not present

## 2022-11-01 DIAGNOSIS — E876 Hypokalemia: Secondary | ICD-10-CM | POA: Diagnosis not present

## 2022-11-01 DIAGNOSIS — Z808 Family history of malignant neoplasm of other organs or systems: Secondary | ICD-10-CM | POA: Diagnosis not present

## 2022-11-01 DIAGNOSIS — D72820 Lymphocytosis (symptomatic): Secondary | ICD-10-CM | POA: Insufficient documentation

## 2022-11-01 DIAGNOSIS — Z79899 Other long term (current) drug therapy: Secondary | ICD-10-CM | POA: Insufficient documentation

## 2022-11-01 DIAGNOSIS — D72821 Monocytosis (symptomatic): Secondary | ICD-10-CM | POA: Diagnosis not present

## 2022-11-01 DIAGNOSIS — Z800 Family history of malignant neoplasm of digestive organs: Secondary | ICD-10-CM | POA: Insufficient documentation

## 2022-11-01 DIAGNOSIS — Z86010 Personal history of colonic polyps: Secondary | ICD-10-CM | POA: Diagnosis not present

## 2022-11-01 DIAGNOSIS — Z87442 Personal history of urinary calculi: Secondary | ICD-10-CM | POA: Insufficient documentation

## 2022-11-01 DIAGNOSIS — F319 Bipolar disorder, unspecified: Secondary | ICD-10-CM | POA: Insufficient documentation

## 2022-11-01 LAB — CBC WITH DIFFERENTIAL/PLATELET
Abs Immature Granulocytes: 0.06 10*3/uL (ref 0.00–0.07)
Basophils Absolute: 0.1 10*3/uL (ref 0.0–0.1)
Basophils Relative: 0 %
Eosinophils Absolute: 0.2 10*3/uL (ref 0.0–0.5)
Eosinophils Relative: 2 %
HCT: 47 % — ABNORMAL HIGH (ref 36.0–46.0)
Hemoglobin: 15.5 g/dL — ABNORMAL HIGH (ref 12.0–15.0)
Immature Granulocytes: 1 %
Lymphocytes Relative: 40 %
Lymphs Abs: 4.6 10*3/uL — ABNORMAL HIGH (ref 0.7–4.0)
MCH: 28.7 pg (ref 26.0–34.0)
MCHC: 33 g/dL (ref 30.0–36.0)
MCV: 86.9 fL (ref 80.0–100.0)
Monocytes Absolute: 0.7 10*3/uL (ref 0.1–1.0)
Monocytes Relative: 6 %
Neutro Abs: 6.1 10*3/uL (ref 1.7–7.7)
Neutrophils Relative %: 51 %
Platelets: 305 10*3/uL (ref 150–400)
RBC: 5.41 MIL/uL — ABNORMAL HIGH (ref 3.87–5.11)
RDW: 13.8 % (ref 11.5–15.5)
WBC: 11.7 10*3/uL — ABNORMAL HIGH (ref 4.0–10.5)
nRBC: 0 % (ref 0.0–0.2)

## 2022-11-01 LAB — BASIC METABOLIC PANEL - CANCER CENTER ONLY
Anion gap: 8 (ref 5–15)
BUN: 8 mg/dL (ref 6–20)
CO2: 25 mmol/L (ref 22–32)
Calcium: 8.6 mg/dL — ABNORMAL LOW (ref 8.9–10.3)
Chloride: 103 mmol/L (ref 98–111)
Creatinine: 0.79 mg/dL (ref 0.44–1.00)
GFR, Estimated: >60 mL/min (ref >60)
Glucose, Bld: 103 mg/dL — ABNORMAL HIGH (ref 70–99)
Potassium: 2.8 mmol/L — ABNORMAL LOW (ref 3.5–5.1)
Sodium: 136 mmol/L (ref 135–145)

## 2022-11-01 MED ORDER — POTASSIUM CHLORIDE 20 MEQ/15ML (10%) PO SOLN: 20.0000 meq | Freq: Every day | ORAL | 2 refills | Status: DC

## 2022-11-01 NOTE — Progress Notes (Unsigned)
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 likley related to Lithium.  predominant neutrophilia; lymphocytosis; monocytosis  Hb- 16; platelets- 436 [LL-400];   # JUNE 2023- peripheral blood flow cytometry- non-specific ; BCR ABL FISH-NEGATIVGE; JAK2 mutation CALR/MPL-NEGATIVE. Discussed regarding bone marrow biopsy- given continued leucocytosis However, hold bone marrow biopsy at this time.  #Bipolar disorder/Depression-on lithium [Dr.Agarwal; [facility]]  1. Obstructing 6 mm calculus in the proximal third of the left ureter resulting in mild left hydroureteronephrosis. 2. Findings suggestive of hepatic steatosis.   Aortic Atherosclerosis (ICD10-I70.0).  Oncology History   No history exists.     HPI: with mother; ambulating independently.  Jennifer [name] 57 y.o.  female pleasant patient depression/bipolar currently here for follow-up to review the results of her blood work for elevated white count.  Patient is doing well.  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  She has done very well with that.  Her energy levels have improved.  She continues to be off lithium.  Her tremors have resolved  Review of Systems  Constitutional:  Negative for chills, diaphoresis, fever and weight loss.  HENT:  Negative for nosebleeds and sore throat.   Eyes:  Negative for double vision.  Respiratory:  Negative for cough, hemoptysis, sputum production, shortness of breath and wheezing.   Cardiovascular:  Negative for chest pain, palpitations, orthopnea and leg swelling.  Gastrointestinal:  Negative for blood in stool, constipation, diarrhea, heartburn  and melena.  Genitourinary:  Negative for dysuria, frequency and urgency.  Skin: Negative.  Negative for itching and rash.  Neurological:  Positive for dizziness. Negative for tingling, focal weakness and headaches.  Endo/Heme/Allergies:  Does not bruise/bleed easily.  Psychiatric/Behavioral:  Negative for depression. The patient is not nervous/anxious and does not have insomnia.       PAST MEDICAL HISTORY :  Past Medical History:  Diagnosis Date   Anxiety    Cancer ([place])    Depression    Diverticulosis    Fundic gland polyps of stomach, benign    History of kidney stones    Low HDL (under 40)    Personal history of adenomatous colonic polyps 05/08/2012   Rosacea    Tenosynovitis, de Quervain    Urosepsis 10/2011   after stent    PAST SURGICAL HISTORY :   Past Surgical History:  Procedure Laterality Date   CERVICAL BIOPSY  W/ LOOP ELECTRODE EXCISION  04/2003   COLONOSCOPY     CYSTOSCOPY W/ URETERAL STENT PLACEMENT  10/14/2011   Procedure: CYSTOSCOPY WITH RETROGRADE PYELOGRAM/URETERAL STENT PLACEMENT;  Surgeon: Hanley Ben, MD;  Location: [facility];  Service: Urology;  Laterality: Right;  Right Double J Stent   CYSTOSCOPY W/ URETERAL STENT PLACEMENT Left 12/26/2021   Procedure: CYSTOSCOPY WITH RETROGRADE PYELOGRAM/URETERAL STENT PLACEMENT;  Surgeon: Ardis Hughs, MD;  Location: [facility];  Service: Urology;  Laterality: Left;   CYSTOSCOPY/URETEROSCOPY/HOLMIUM LASER/STENT PLACEMENT Left 01/19/2022   Procedure: LEFT URETEROSCOPY/STENT EXCHANGE/BASKET STONE REMOVAL;  Surgeon: Ardis Hughs, MD;  Location: [facility];  Service: Urology;  Laterality: Left;   MULTIPLE TOOTH EXTRACTIONS     OOPHORECTOMY     left ovary   POLYPECTOMY     TUBAL LIGATION     UMBILICAL HERNIA REPAIR  UPPER GASTROINTESTINAL ENDOSCOPY  2008   VAGINAL HYSTERECTOMY     WISDOM TOOTH EXTRACTION      FAMILY HISTORY :   Family History  Problem Relation Age of Onset   Nephrolithiasis Mother    Skin  cancer Mother        from radiation tx   Hyperlipidemia Mother    Squamous cell carcinoma Mother    CVA Father 52   Fibroids Sister    Other Sister        breast cyst   Skin cancer Sister    Hernia Maternal Grandmother    Pancreatic cancer Maternal Grandfather    Alcohol abuse Paternal Grandmother    Depression Paternal Grandmother    Suicidality Paternal Aunt    Depression Paternal Aunt    Non-Hodgkin's lymphoma Maternal Great-grandmother    Irritable bowel syndrome Other    Anesthesia problems Neg Hx    Hypotension Neg Hx    Malignant hyperthermia Neg Hx    Pseudochol deficiency Neg Hx    Colon cancer Neg Hx    Colon polyps Neg Hx    Esophageal cancer Neg Hx    Stomach cancer Neg Hx    Rectal cancer Neg Hx     SOCIAL HISTORY:   Social History   Tobacco Use   Smoking status: Every Day    Packs/day: 0.25    Years: 30.00    Additional pack years: 0.00    Total pack years: 7.50    Types: Cigarettes   Smokeless tobacco: Never  Vaping Use   Vaping Use: Never used  Substance Use Topics   Alcohol use: No   Drug use: No    ALLERGIES:  is allergic to ceftriaxone, nitrofurantoin monohyd macro, and sulfa antibiotics.  MEDICATIONS:  Current Outpatient Medications  Medication Sig Dispense Refill   acetaminophen (TYLENOL) 325 MG tablet Take 650 mg by mouth every 6 (six) hours as needed for moderate pain.     albuterol (VENTOLIN HFA) 108 (90 Base) MCG/ACT inhaler Inhale 1-2 puffs into the lungs every 6 (six) hours as needed for wheezing or shortness of breath. 6.7 g 0   ASPIRIN LOW DOSE 81 MG EC tablet TAKE ONE TABLET BY MOUTH DAILY 90 tablet 3   atorvastatin (LIPITOR) 10 MG tablet TAKE 1 TABLET BY MOUTH DAILY **NEEDS APPOINTMENT** 90 tablet 3   clonazePAM (KLONOPIN) 1 MG tablet Take 1 tablet (1 mg total) by mouth 2 (two) times daily as needed for anxiety. 60 tablet 1   ipratropium (ATROVENT) 0.06 % nasal spray Place 1 spray into both nostrils daily as needed for rhinitis.      Multiple Vitamin (MULTIVITAMIN) capsule Take 1 capsule by mouth daily.     oxybutynin (DITROPAN-XL) 5 MG 24 hr tablet Take 5 mg by mouth at bedtime.     PARoxetine (PAXIL) 40 MG tablet Take 1.5 tablets (60 mg total) by mouth every morning. 135 tablet 0   Probiotic Product (UP4 PROBIOTICS ADULT PO) Take by mouth daily.     promethazine (PHENERGAN) 25 MG tablet Take 1 tablet (25 mg total) by mouth every 6 (six) hours as needed for nausea or vomiting. 30 tablet 0   sodium fluoride (FLUORISHIELD) 1.1 % GEL dental gel Place 1 application  onto teeth every other day.     valACYclovir (VALTREX) 1000 MG tablet Take 1,000 mg by mouth daily as needed (for fever blisters).     clindamycin (CLINDAGEL) 1 % gel Apply 1 application. topically daily as needed (For  lips). (Patient not taking: Reported on 09/01/2022)     potassium chloride 20 MEQ/15ML (10%) SOLN Take 15 mLs (20 mEq total) by mouth daily. 450 mL 2   No current facility-administered medications for this visit.    PHYSICAL EXAMINATION:  BP 134/73 (BP Location: Left Arm, Patient Position: Sitting, Cuff Size: Normal)   Pulse 71   Temp 98.1 F (36.7 C) (Tympanic)   Ht 5' 6.5" (1.689 m)   Wt 199 lb 12.8 oz (90.6 kg)   BMI 31.77 kg/m   Filed Weights   11/01/22 1538  Weight: 199 lb 12.8 oz (90.6 kg)    Physical Exam Vitals and nursing note reviewed.  HENT:     Head: Normocephalic and atraumatic.     Mouth/Throat:     Pharynx: Oropharynx is clear.  Eyes:     Extraocular Movements: Extraocular movements intact.     Pupils: Pupils are equal, round, and reactive to light.  Cardiovascular:     Rate and Rhythm: Normal rate and regular rhythm.  Pulmonary:     Comments: Decreased breath sounds bilaterally.  Abdominal:     Palpations: Abdomen is soft.  Musculoskeletal:        General: Normal range of motion.     Cervical back: Normal range of motion.  Skin:    General: Skin is warm.  Neurological:     General: No focal deficit  present.     Mental Status: She is alert and oriented to person, place, and time.  Psychiatric:        Behavior: Behavior normal.        Judgment: Judgment normal.        LABORATORY DATA:  I have reviewed the data as listed    Component Value Date/Time   NA 136 11/01/2022 1526   NA 141 06/28/2021 1604   K 2.8 (L) 11/01/2022 1526   CL 103 11/01/2022 1526   CO2 25 11/01/2022 1526   GLUCOSE 103 (H) 11/01/2022 1526   BUN 8 11/01/2022 1526   BUN 8 06/28/2021 1604   CREATININE 0.79 11/01/2022 1526   CREATININE 0.85 01/28/2022 1506   CALCIUM 8.6 (L) 11/01/2022 1526   PROT 7.4 06/27/2022 1517   PROT 6.4 06/28/2021 1604   ALBUMIN 4.1 06/27/2022 1517   ALBUMIN 4.2 06/28/2021 1604   AST 19 06/27/2022 1517   ALT 14 06/27/2022 1517   ALKPHOS 78 06/27/2022 1517   BILITOT 0.4 06/27/2022 1517   BILITOT 0.3 06/28/2021 1604   GFRNONAA >60 11/01/2022 1526   GFRAA >60 08/10/2017 1122    No results found for: "SPEP", "UPEP"  Lab Results  Component Value Date   WBC 11.7 (H) 11/01/2022   NEUTROABS 6.1 11/01/2022   HGB 15.5 (H) 11/01/2022   HCT 47.0 (H) 11/01/2022   MCV 86.9 11/01/2022   PLT 305 11/01/2022      Chemistry      Component Value Date/Time   NA 136 11/01/2022 1526   NA 141 06/28/2021 1604   K 2.8 (L) 11/01/2022 1526   CL 103 11/01/2022 1526   CO2 25 11/01/2022 1526   BUN 8 11/01/2022 1526   BUN 8 06/28/2021 1604   CREATININE 0.79 11/01/2022 1526   CREATININE 0.85 01/28/2022 1506   GLU 95 09/11/2020 0000      Component Value Date/Time   CALCIUM 8.6 (L) 11/01/2022 1526   ALKPHOS 78 06/27/2022 1517   AST 19 06/27/2022 1517   ALT 14 06/27/2022 1517   BILITOT 0.4 06/27/2022  1517   BILITOT 0.3 06/28/2021 1604       RADIOGRAPHIC STUDIES: I have personally reviewed the radiological images as listed and agreed with the findings in the report. No results found.   ASSESSMENT & PLAN:  Acquired neutrophilia # LEUCOCYTOSIS/neutrophilia/lymphocytosis  monocytosis-chronic [2010 mild]; May 2023-White count 50,000 [pyelonephritis/C.diff]-more recently t 20,000. likley related to Lithium.   # JUNE 2023- peripheral blood flow cytometry- non-specific ; BCR ABL FISH-NEGATIVGE; JAK2 mutation CALR/MPL-NEGATIVE.    CBC with differential reviewed today. WBC 11.7, hemoglobin 15.5 and platelets of 305.  ALC 4.6.  WBC is only mildly elevated.  It is significantly gone down after she was taken off the lithium.  At this time no further intervention from hematology standpoint is warranted.  She will follow-up with Dr. B her primary hematologist in the next 6 months to assess the further need for follow-up.   # Hypokalemia: 2.8 today-difficulty with swallowing the pills.  She has history of chronic hypokalemia.  Levels go down when she is off potassium.  Sent a prescription for potassium liquid 20 mEq to be taken daily with 27-month supply.  She has switched to a new primary care doctor Dr. Volanda Napoleon and has appointment on June 4 and will have repeat labs.    # BiPolar [Dr.Agarwal;[facility]] lithium discontinued.    mychart-  # DISPOSITION: # Follow-up in 6 months with Dr. Jacinto Reap, labs.   Orders Placed This Encounter  Procedures   Basic Metabolic Panel - [facility] Only    Standing Status:   Future    Number of Occurrences:   1    Standing Expiration Date:   11/01/2023   All questions were answered. The patient knows to call the clinic with any problems, questions or concerns.      Jane Canary, MD 11/01/2022 4:09 PM

## 2022-11-17 ENCOUNTER — Telehealth (HOSPITAL_BASED_OUTPATIENT_CLINIC_OR_DEPARTMENT_OTHER): Payer: 59 | Admitting: Psychiatry

## 2022-11-17 DIAGNOSIS — F411 Generalized anxiety disorder: Secondary | ICD-10-CM

## 2022-11-17 DIAGNOSIS — F4310 Post-traumatic stress disorder, unspecified: Secondary | ICD-10-CM | POA: Diagnosis not present

## 2022-11-17 DIAGNOSIS — F332 Major depressive disorder, recurrent severe without psychotic features: Secondary | ICD-10-CM

## 2022-11-17 MED ORDER — PAROXETINE HCL 40 MG PO TABS: 60.0000 mg | ORAL_TABLET | ORAL | 0 refills | Status: DC

## 2022-11-17 MED ORDER — CLONAZEPAM 1 MG PO TABS: 1.0000 mg | ORAL_TABLET | Freq: Two times a day (BID) | ORAL | 0 refills | Status: DC | PRN

## 2022-11-17 MED ORDER — DIVALPROEX SODIUM ER 250 MG PO TB24: 250.0000 mg | ORAL_TABLET | Freq: Every day | ORAL | 0 refills | Status: DC

## 2022-11-17 NOTE — Progress Notes (Signed)
Virtual Visit via Video Note  I connected with Jennifer [name] on 11/17/22 at  3:30 PM EDT by a video enabled telemedicine application and verified that I am speaking with the correct person using two identifiers.  Location: Patient: home Provider: office   I discussed the limitations of evaluation and management by telemedicine and the availability of in person appointments. The patient expressed understanding and agreed to proceed.  History of Present Illness: Jennifer [name] shares she is doing ok. For the last 4 weeks she has "good things and odd things happen". She feels a little sad and the anniversary of her husband and aunt happened in Feb and March. She feels like something is wrong and doesn't feel motivated to do much nearly everyday. She just doesn't feels happy.  She is not crying but is tired. Her sleep and appetite are good. Jennifer [name] is back to taking potassium, as her recent labs showed it was low. She denies anhedonia. She denies SI/HI. She has occasional passive thoughts of death. Sometimes she feels like the only reason she keeps going is because of her dogs. She is easily irritated but it is getting better. It ties to her anxiety. 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 Her focus is good. She denies any nightmares but admits to some intrusive memories of her husband. Jennifer [name] is not sure what to do to make herself feel better. She is taking Klonopin 0.5mg  po qHS for sleep.    Observations/Objective: Psychiatric Specialty Exam: ROS  There were no vitals taken for this visit.There is no height or weight on file to calculate BMI.  General Appearance: Casual and Neat  Eye Contact:  Good  Speech:  Clear and Coherent and Normal Rate  Volume:  Normal  Mood:  Anxious and Depressed  Affect:  Full Range  Thought Process:  Goal Directed, Linear, and Descriptions of Associations: Intact  Orientation:  Full (Time, Place, and Person)  Thought Content:  Logical  Suicidal  Thoughts:  No  Homicidal Thoughts:  No  Memory:  Immediate;   Good  Judgement:  Good  Insight:  Good  Psychomotor Activity:  Normal  Concentration:  Concentration: Good  Recall:  Good  Fund of Knowledge:  Good  Language:  Good  Akathisia:  No  Handed:  Right  AIMS (if indicated):     Assets:  Communication Skills Desire for Improvement Financial Resources/Insurance Housing Leisure Time Resilience Social Support Talents/Skills Transportation Vocational/Educational  ADL's:  Intact  Cognition:  WNL  Sleep:        Assessment and Plan:     11/17/2022    3:39 PM 09/01/2022    3:50 PM 06/09/2022    3:45 PM 04/21/2022    9:49 AM 04/14/2022    3:27 PM  Depression screen PHQ 2/9  Decreased Interest 1 0 0 0 0  Down, Depressed, Hopeless 3 0 0 1 0  PHQ - 2 Score 4 0 0 1 0  Altered sleeping 0  0  0  Tired, decreased energy 3  3  1   Change in appetite 0  0  0  Feeling bad or failure about yourself  0  0  0  Trouble concentrating 0  0  0  Moving slowly or fidgety/restless 0  0  0  Suicidal thoughts 1  0  0  PHQ-9 Score 8  3  1   Difficult doing work/chores Very difficult  Not difficult at all  Not difficult at all  Flowsheet Row [facility]/17/2022 in [facility] Video Visit from 09/01/2022 in [facility] Video Visit from 06/09/2022 in [facility]  C-SSRS RISK CATEGORY Error: Q3, 4, or 5 should not be populated when Q2 is No No Risk No Risk          Pt is aware that these meds carry a teratogenic risk. Pt will discuss plan of action if she does or plans to become pregnant in the future.  Status of current problems: worsening depression and irritability symptoms   Medication management with supportive therapy. Risks and benefits, side effects and alternative treatment options discussed with patient. Pt was given an opportunity to ask questions about  medication, illness, and treatment. All current psychiatric medications have been reviewed and discussed with the patient and adjusted as clinically appropriate.  Pt verbalized understanding and verbal consent obtained for treatment.  Meds: start Depakote ER 250mg  po qHS 1. Severe episode of recurrent major depressive disorder, without psychotic features - PARoxetine (PAXIL) 40 MG tablet; Take 1.5 tablets (60 mg total) by mouth every morning.  Dispense: 135 tablet; Refill: 0 - divalproex (DEPAKOTE ER) 250 MG 24 hr tablet; Take 1 tablet (250 mg total) by mouth daily.  Dispense: 30 tablet; Refill: 0  2. GAD (generalized anxiety disorder) - PARoxetine (PAXIL) 40 MG tablet; Take 1.5 tablets (60 mg total) by mouth every morning.  Dispense: 135 tablet; Refill: 0 - clonazePAM (KLONOPIN) 1 MG tablet; Take 1 tablet (1 mg total) by mouth 2 (two) times daily as needed for anxiety.  Dispense: 60 tablet; Refill: 0  3. PTSD (post-traumatic stress disorder) - PARoxetine (PAXIL) 40 MG tablet; Take 1.5 tablets (60 mg total) by mouth every morning.  Dispense: 135 tablet; Refill: 0     Labs: reviewed AST and ALT wnl on 06/27/22   Therapy: brief supportive therapy provided. Discussed psychosocial stressors in detail.     Collaboration of Care: Other none  Patient/Guardian was advised Release of Information must be obtained prior to any record release in order to collaborate their care with an outside provider. Patient/Guardian was advised if they have not already done so to contact the registration department to sign all necessary forms in order for [place] to release information regarding their care.   Consent: Patient/Guardian gives verbal consent for treatment and assignment of benefits for services provided during this visit. Patient/Guardian expressed understanding and agreed to proceed.      Follow Up Instructions: Follow up in 2 weeks or sooner if needed    I discussed the assessment and treatment  plan with the patient. The patient was provided an opportunity to ask questions and all were answered. The patient agreed with the plan and demonstrated an understanding of the instructions.   The patient was advised to call back or seek an in-person evaluation if the symptoms worsen or if the condition fails to improve as anticipated.  I provided 17 minutes of non-face-to-face time during this encounter.   Oletta Darter, MD

## 2022-11-22 ENCOUNTER — Encounter: Payer: Self-pay | Admitting: Internal Medicine

## 2022-11-30 ENCOUNTER — Telehealth: Payer: Self-pay | Admitting: *Deleted

## 2022-11-30 ENCOUNTER — Other Ambulatory Visit: Payer: Self-pay | Admitting: *Deleted

## 2022-11-30 ENCOUNTER — Telehealth: Payer: Self-pay

## 2022-11-30 DIAGNOSIS — Z86010 Personal history of colonic polyps: Secondary | ICD-10-CM

## 2022-11-30 MED ORDER — NA SULFATE-K SULFATE-MG SULF 17.5-3.13-1.6 GM/177ML PO SOLN: 1.0000 | Freq: Once | ORAL | 0 refills | Status: AC

## 2022-11-30 NOTE — Telephone Encounter (Signed)
Called patient and schedule colonoscopy on 12/29/2022 with Dr Tobi Bastos

## 2022-11-30 NOTE — Telephone Encounter (Signed)
Gastroenterology Pre-Procedure Review  Request Date: 12/29/2022 Requesting Physician: Dr. Tobi Bastos  PATIENT REVIEW QUESTIONS: The patient responded to the following health history questions as indicated:    1. Are you having any GI issues? no 2. Do you have a personal history of Polyps? yes (10/20/2017) 3. Do you have a family history of Colon Cancer or Polyps? no 4. Diabetes Mellitus? no 5. Joint replacements in the past 12 months?no 6. Major health problems in the past 3 months?no 7. Any artificial heart valves, MVP, or defibrillator?no    MEDICATIONS & ALLERGIES:    Patient reports the following regarding taking any anticoagulation/antiplatelet therapy:   Plavix, Coumadin, Eliquis, Xarelto, Lovenox, Pradaxa, Brilinta, or Effient? no Aspirin? yes (81 mg)  Patient confirms/reports the following medications:  Current Outpatient Medications  Medication Sig Dispense Refill   acetaminophen (TYLENOL) 325 MG tablet Take 650 mg by mouth every 6 (six) hours as needed for moderate pain.     albuterol (VENTOLIN HFA) 108 (90 Base) MCG/ACT inhaler Inhale 1-2 puffs into the lungs every 6 (six) hours as needed for wheezing or shortness of breath. 6.7 g 0   ASPIRIN LOW DOSE 81 MG EC tablet TAKE ONE TABLET BY MOUTH DAILY 90 tablet 3   atorvastatin (LIPITOR) 10 MG tablet TAKE 1 TABLET BY MOUTH DAILY **NEEDS APPOINTMENT** 90 tablet 3   clindamycin (CLINDAGEL) 1 % gel Apply 1 application. topically daily as needed (For lips). (Patient not taking: Reported on 11/17/2022)     clonazePAM (KLONOPIN) 1 MG tablet Take 1 tablet (1 mg total) by mouth 2 (two) times daily as needed for anxiety. 60 tablet 0   divalproex (DEPAKOTE ER) 250 MG 24 hr tablet Take 1 tablet (250 mg total) by mouth daily. 30 tablet 0   ipratropium (ATROVENT) 0.06 % nasal spray Place 1 spray into both nostrils daily as needed for rhinitis.     Multiple Vitamin (MULTIVITAMIN) capsule Take 1 capsule by mouth daily.     oxybutynin (DITROPAN-XL) 5  MG 24 hr tablet Take 5 mg by mouth at bedtime.     PARoxetine (PAXIL) 40 MG tablet Take 1.5 tablets (60 mg total) by mouth every morning. 135 tablet 0   potassium chloride 20 MEQ/15ML (10%) SOLN Take 15 mLs (20 mEq total) by mouth daily. 450 mL 2   Probiotic Product (UP4 PROBIOTICS ADULT PO) Take by mouth daily.     promethazine (PHENERGAN) 25 MG tablet Take 1 tablet (25 mg total) by mouth every 6 (six) hours as needed for nausea or vomiting. 30 tablet 0   sodium fluoride (FLUORISHIELD) 1.1 % GEL dental gel Place 1 application  onto teeth every other day.     valACYclovir (VALTREX) 1000 MG tablet Take 1,000 mg by mouth daily as needed (for fever blisters).     No current facility-administered medications for this visit.    Patient confirms/reports the following allergies:  Allergies  Allergen Reactions   Ceftriaxone Rash    Significant full body rash,  Tolerated cefepime 5/22   Nitrofurantoin Monohyd Macro Nausea And Vomiting    Body aches   Sulfa Antibiotics Nausea Only and Rash    Fever and disorientation    No orders of the defined types were placed in this encounter.   AUTHORIZATION INFORMATION Primary Insurance: 1D#: Group #:  Secondary Insurance: 1D#: Group #:  SCHEDULE INFORMATION: Date: 12/29/2022 Time: Location: [facility]

## 2022-11-30 NOTE — Telephone Encounter (Signed)
Pt calling to schedule to her colonoscopy with Dr. Tobi Bastos. Please call this patient after 3:00pm.

## 2022-12-01 ENCOUNTER — Telehealth (HOSPITAL_COMMUNITY): Payer: 59 | Admitting: Psychiatry

## 2022-12-01 DIAGNOSIS — F332 Major depressive disorder, recurrent severe without psychotic features: Secondary | ICD-10-CM | POA: Diagnosis not present

## 2022-12-01 DIAGNOSIS — F411 Generalized anxiety disorder: Secondary | ICD-10-CM | POA: Diagnosis not present

## 2022-12-01 MED ORDER — DIVALPROEX SODIUM ER 250 MG PO TB24
250.0000 mg | ORAL_TABLET | Freq: Every day | ORAL | 0 refills | Status: DC
Start: 2022-12-01 — End: 2023-01-19

## 2022-12-01 MED ORDER — CLONAZEPAM 1 MG PO TABS
1.0000 mg | ORAL_TABLET | Freq: Two times a day (BID) | ORAL | 0 refills | Status: DC | PRN
Start: 2022-12-01 — End: 2023-01-19

## 2022-12-01 NOTE — Progress Notes (Signed)
Virtual Visit via Video Note  I connected with Jennifer [name] on 12/01/22 at  4:00 PM EDT by a video enabled telemedicine application and verified that I am speaking with the correct person using two identifiers.  Location: Patient: home Provider: office   I discussed the limitations of evaluation and management by telemedicine and the availability of in person appointments. The patient expressed understanding and agreed to proceed.  History of Present Illness: I last met with Jennifer [name] 2 weeks ago and started Depakote. Today she states she started feeling better for the last 1 week. She spent time with her boyfriend on Friday and her son on Saturday right after our meeting. She started crying and feeling anxious on Saturday. [name] has been Depakote for almost 2 weeks now. She has not noticed any SE. The anxiety is more manageable and no longer feeling like something is wrong. [name] is no longer feeling irritable and angry. Her depression is improving. In the last 2 weeks she has been depressed for about 7 days. Her motivation and interest are improving. She is engaging in hobbies.   Her sleep is good. Her energy is fair. Her appetite is good and unchanged. Her negative self talk has significantly decreased. She denies SI/HI.     Observations/Objective: Psychiatric Specialty Exam: ROS  There were no vitals taken for this visit.There is no height or weight on file to calculate BMI.  General Appearance: Casual and Fairly Groomed  Eye Contact:  Good  Speech:  Clear and Coherent and Normal Rate  Volume:  Normal  Mood:  Euthymic  Affect:  Full Range  Thought Process:  Goal Directed, Linear, and Descriptions of Associations: Intact  Orientation:  Full (Time, Place, and Person)  Thought Content:  Logical  Suicidal Thoughts:  No  Homicidal Thoughts:  No  Memory:  Immediate;   Good  Judgement:  Good  Insight:  Good  Psychomotor Activity:  Normal  Concentration:  Concentration: Good   Recall:  Good  Fund of Knowledge:  Good  Language:  Good  Akathisia:  No  Handed:  Right  AIMS (if indicated):     Assets:  Communication Skills Desire for Improvement Financial Resources/Insurance Housing Intimacy Leisure Time Resilience Social Support Talents/Skills Transportation Vocational/Educational  ADL's:  Intact  Cognition:  WNL  Sleep:        Assessment and Plan:     12/01/2022    4:06 PM 11/17/2022    3:39 PM 09/01/2022    3:50 PM 06/09/2022    3:45 PM 04/21/2022    9:49 AM  Depression screen PHQ 2/9  Decreased Interest 1 1 0 0 0  Down, Depressed, Hopeless 2 3 0 0 1  PHQ - 2 Score 3 4 0 0 1  Altered sleeping 0 0  0   Tired, decreased energy Change in appetite 0 0  0   Feeling bad or failure about yourself  1 0  0   Trouble concentrating 0 0  0   Moving slowly or fidgety/restless 0 0  0   Suicidal thoughts 0 1  0   PHQ-9 Score Difficult doing work/chores Somewhat difficult Very difficult  Not difficult at all     Flowsheet Row Video Visit from 12/01/2022 in [facility] Video Visit from 11[facility]17/2022 in [facility] Video Visit from 09/01/2022 in [facility]  C-SSRS RISK CATEGORY Error: Q3,  4, or 5 should not be populated when Q2 is No Error: Q3, 4, or 5 should not be populated when Q2 is No No Risk          Pt is aware that these meds carry a teratogenic risk. Pt will discuss plan of action if she does or plans to become pregnant in the future.  Status of current problems: improvement in depression, irritability and anxiety   Medication management with supportive therapy. Risks and benefits, side effects and alternative treatment options discussed with patient. Pt was given an opportunity to ask questions about medication, illness, and treatment. All current psychiatric medications have been reviewed and discussed with the  patient and adjusted as clinically appropriate.  Pt verbalized understanding and verbal consent obtained for treatment.  Meds: continue Klonopin  po BID prn anxiety. She is taking 1/2 tab at night.  Paxil  po qD 1. Severe episode of recurrent major depressive disorder, without psychotic features - divalproex (DEPAKOTE ER) 250 MG 24 hr tablet; Take 1 tablet (250 mg total) by mouth daily.  Dispense: 90 tablet; Refill: 0  2. GAD (generalized anxiety disorder) - clonazePAM (KLONOPIN) 1 MG tablet; Take 1 tablet (1 mg total) by mouth 2 (two) times daily as needed for anxiety.  Dispense: 60 tablet; Refill: 0       Therapy: brief supportive therapy provided. Discussed psychosocial stressors in detail.     Collaboration of Care: Other none  Patient/Guardian was advised Release of Information must be obtained prior to any record release in order to collaborate their care with an outside provider. Patient/Guardian was advised if they have not already done so to contact the registration department to sign all necessary forms in order for [place] to release information regarding their care.   Consent: Patient/Guardian gives verbal consent for treatment and assignment of benefits for services provided during this visit. Patient/Guardian expressed understanding and agreed to proceed.     Follow Up Instructions: Follow up in 2 months or sooner if needed  Patient informed that I am leaving [facility] in 02/2023 and I relayed that they will be getting a new provider after that. Patient verbalized understanding and agreed with the plan.     I discussed the assessment and treatment plan with the patient. The patient was provided an opportunity to ask questions and all were answered. The patient agreed with the plan and demonstrated an understanding of the instructions.   The patient was advised to call back or seek an in-person evaluation if the symptoms worsen or if the condition fails to improve as  anticipated.  I provided 14 minutes of non-face-to-face time during this encounter.   Oletta Darter, MD

## 2022-12-02 ENCOUNTER — Other Ambulatory Visit: Payer: Managed Care, Other (non HMO)

## 2022-12-02 ENCOUNTER — Ambulatory Visit: Payer: Managed Care, Other (non HMO) | Admitting: Internal Medicine

## 2022-12-12 ENCOUNTER — Encounter: Payer: Self-pay | Admitting: Gastroenterology

## 2022-12-12 ENCOUNTER — Encounter (HOSPITAL_COMMUNITY): Payer: Self-pay

## 2022-12-28 ENCOUNTER — Encounter: Payer: Self-pay | Admitting: Gastroenterology

## 2022-12-29 ENCOUNTER — Ambulatory Visit: Payer: Managed Care, Other (non HMO) | Admitting: Anesthesiology

## 2022-12-29 ENCOUNTER — Other Ambulatory Visit: Payer: Self-pay

## 2022-12-29 ENCOUNTER — Encounter
Admission: RE | Disposition: A | Discharge status: Home / Self Care | Payer: Self-pay | Source: Home / Self Care | Attending: Gastroenterology

## 2022-12-29 ENCOUNTER — Ambulatory Visit
Admission: RE | Admit: 2022-12-29 | Discharge: 2022-12-29 | Disposition: A | Discharge status: Home / Self Care | Payer: Managed Care, Other (non HMO) | Attending: Gastroenterology | Admitting: Gastroenterology

## 2022-12-29 ENCOUNTER — Encounter: Payer: Self-pay | Admitting: Gastroenterology

## 2022-12-29 DIAGNOSIS — Z860100 Personal history of colon polyps, unspecified: Secondary | ICD-10-CM

## 2022-12-29 DIAGNOSIS — D126 Benign neoplasm of colon, unspecified: Secondary | ICD-10-CM | POA: Diagnosis not present

## 2022-12-29 DIAGNOSIS — D125 Benign neoplasm of sigmoid colon: Secondary | ICD-10-CM | POA: Diagnosis not present

## 2022-12-29 DIAGNOSIS — F17210 Nicotine dependence, cigarettes, uncomplicated: Secondary | ICD-10-CM | POA: Diagnosis not present

## 2022-12-29 DIAGNOSIS — Z09 Encounter for follow-up examination after completed treatment for conditions other than malignant neoplasm: Secondary | ICD-10-CM | POA: Diagnosis not present

## 2022-12-29 DIAGNOSIS — J449 Chronic obstructive pulmonary disease, unspecified: Secondary | ICD-10-CM | POA: Diagnosis not present

## 2022-12-29 DIAGNOSIS — Z1211 Encounter for screening for malignant neoplasm of colon: Secondary | ICD-10-CM | POA: Diagnosis present

## 2022-12-29 DIAGNOSIS — F419 Anxiety disorder, unspecified: Secondary | ICD-10-CM | POA: Diagnosis not present

## 2022-12-29 HISTORY — PX: COLONOSCOPY WITH PROPOFOL: SHX5780

## 2022-12-29 SURGERY — COLONOSCOPY WITH PROPOFOL
Anesthesia: General

## 2022-12-29 MED ORDER — PROPOFOL 10 MG/ML IV BOLUS
INTRAVENOUS | Status: DC | PRN
  Administered 2022-12-29: 30 mg via INTRAVENOUS
  Administered 2022-12-29: 70 mg via INTRAVENOUS

## 2022-12-29 MED ORDER — PROPOFOL 500 MG/50ML IV EMUL
INTRAVENOUS | Status: DC | PRN
  Administered 2022-12-29: 165 ug/kg/min via INTRAVENOUS

## 2022-12-29 MED ORDER — PROPOFOL 1000 MG/100ML IV EMUL
INTRAVENOUS | Status: AC
  Filled 2022-12-29: qty 400

## 2022-12-29 MED ORDER — SODIUM CHLORIDE 0.9 % IV SOLN: INTRAVENOUS | Status: DC

## 2022-12-29 MED ORDER — LIDOCAINE HCL (CARDIAC) PF 100 MG/5ML IV SOSY
PREFILLED_SYRINGE | INTRAVENOUS | Status: DC | PRN
  Administered 2022-12-29: 100 mg via INTRAVENOUS

## 2022-12-29 MED ORDER — DEXMEDETOMIDINE HCL IN NACL 200 MCG/50ML IV SOLN
INTRAVENOUS | Status: DC | PRN
  Administered 2022-12-29: 8 ug via INTRAVENOUS

## 2022-12-29 NOTE — Anesthesia Procedure Notes (Signed)
Procedure Name: General with mask airway Date/Time: 12/29/2022 7:49 AM  Performed by: Mohammed Kindle, CRNAPre-anesthesia Checklist: Patient identified, Emergency Drugs available, Suction available and Patient being monitored Patient Re-evaluated:Patient Re-evaluated prior to induction Oxygen Delivery Method: Simple face mask Induction Type: IV induction Placement Confirmation: positive ETCO2, CO2 detector and breath sounds checked- equal and bilateral Dental Injury: Teeth and Oropharynx as per pre-operative assessment

## 2022-12-29 NOTE — Anesthesia Preprocedure Evaluation (Signed)
Anesthesia Evaluation  Patient identified by MRN, date of birth, ID band Patient awake    Reviewed: Allergy & Precautions, NPO status , Patient's Chart, lab work & pertinent test results  Airway Mallampati: III  TM Distance: >3 FB     Dental  (+) Missing, Poor Dentition   Pulmonary COPD, Current Smoker and Patient abstained from smoking.    + decreased breath sounds      Cardiovascular Exercise Tolerance: Good  Rhythm:Regular Rate:Normal     Neuro/Psych   Anxiety        GI/Hepatic negative GI ROS, Neg liver ROS,,,  Endo/Other  negative endocrine ROS    Renal/GU   negative genitourinary   Musculoskeletal   Abdominal  (+) + obese  Peds negative pediatric ROS (+)  Hematology negative hematology ROS (+)   Anesthesia Other Findings   Reproductive/Obstetrics                             Anesthesia Physical Anesthesia Plan  ASA: 3  Anesthesia Plan: General   Post-op Pain Management:    Induction: Intravenous  PONV Risk Score and Plan:   Airway Management Planned: Natural Airway  Additional Equipment:   Intra-op Plan:   Post-operative Plan:   Informed Consent: I have reviewed the patients History and Physical, chart, labs and discussed the procedure including the risks, benefits and alternatives for the proposed anesthesia with the patient or authorized representative who has indicated his/her understanding and acceptance.       Plan Discussed with: CRNA and Surgeon  Anesthesia Plan Comments:        Anesthesia Quick Evaluation

## 2022-12-29 NOTE — Anesthesia Postprocedure Evaluation (Signed)
Anesthesia Post Note  Patient: Jennifer [name]  Procedure(s) Performed: COLONOSCOPY WITH PROPOFOL  Patient location during evaluation: PACU Anesthesia Type: General Level of consciousness: awake and oriented Pain management: pain level controlled Vital Signs Assessment: post-procedure vital signs reviewed and stable Cardiovascular status: blood pressure returned to baseline Anesthetic complications: no   No notable events documented.   Last Vitals:  Vitals:   12/29/22 0807 12/29/22 0816  BP: (!) 99/58 110/78  Pulse: 71 62  Resp: 19 14  Temp:    SpO2: 96% 97%    Last Pain:  Vitals:   12/29/22 0816  TempSrc:   PainSc: 0-No pain                 VAN STAVEREN,Tyran Huser

## 2022-12-29 NOTE — H&P (Signed)
[name], MD [address], [place], [place], [address] [address], [place], [place], [address] Phone: [phone]  Fax: [phone]  Primary Care Physician:  Gweneth Dimitri, MD   Pre-Procedure History & Physical: HPI:  Jennifer [name] is a 57 y.o. female is here for an colonoscopy.   Past Medical History:  Diagnosis Date   Anxiety    Cancer (HCC)    Depression    Diverticulosis    Fundic gland polyps of stomach, benign    History of kidney stones    Low HDL (under 40)    Personal history of adenomatous colonic polyps 05/08/2012   Rosacea    Tenosynovitis, de Quervain    Urosepsis 10/2011   after stent    Past Surgical History:  Procedure Laterality Date   CERVICAL BIOPSY  W/ LOOP ELECTRODE EXCISION  04/2003   COLONOSCOPY     CYSTOSCOPY W/ URETERAL STENT PLACEMENT  10/14/2011   Procedure: CYSTOSCOPY WITH RETROGRADE PYELOGRAM/URETERAL STENT PLACEMENT;  Surgeon: Lindaann Slough, MD;  Location: [facility];  Service: Urology;  Laterality: Right;  Right Double J Stent   CYSTOSCOPY W/ URETERAL STENT PLACEMENT Left 12/26/2021   Procedure: CYSTOSCOPY WITH RETROGRADE PYELOGRAM/URETERAL STENT PLACEMENT;  Surgeon: Crist Fat, MD;  Location: [facility];  Service: Urology;  Laterality: Left;   CYSTOSCOPY/URETEROSCOPY/HOLMIUM LASER/STENT PLACEMENT Left 01/19/2022   Procedure: LEFT URETEROSCOPY/STENT EXCHANGE/BASKET STONE REMOVAL;  Surgeon: Crist Fat, MD;  Location: [facility];  Service: Urology;  Laterality: Left;   MULTIPLE TOOTH EXTRACTIONS     OOPHORECTOMY     left ovary   POLYPECTOMY     TUBAL LIGATION     UMBILICAL HERNIA REPAIR     UPPER GASTROINTESTINAL ENDOSCOPY  2008   VAGINAL HYSTERECTOMY     WISDOM TOOTH EXTRACTION      Prior to Admission medications   Medication Sig Start Date End Date Taking? Authorizing Provider  albuterol (VENTOLIN HFA) 108 (90 Base) MCG/ACT inhaler Inhale 1-2 puffs into the lungs every 6 (six) hours as needed for  wheezing or shortness of breath. 01/06/22  Yes Gweneth Dimitri, MD  ASPIRIN LOW DOSE 81 MG EC tablet TAKE ONE TABLET BY MOUTH DAILY 12/09/20  Yes Gweneth Dimitri, MD  atorvastatin (LIPITOR) 10 MG tablet TAKE 1 TABLET BY MOUTH DAILY **NEEDS APPOINTMENT** 03/14/22  Yes Gweneth Dimitri, MD  clonazePAM (KLONOPIN) 1 MG tablet Take 1 tablet (1 mg total) by mouth 2 (two) times daily as needed for anxiety. 12/01/22  Yes Oletta Darter, MD  divalproex (DEPAKOTE ER) 250 MG 24 hr tablet Take 1 tablet (250 mg total) by mouth daily. 12/01/22 12/01/23 Yes Oletta Darter, MD  ipratropium (ATROVENT) 0.06 % nasal spray Place 1 spray into both nostrils daily as needed for rhinitis.   Yes [provider]  oxybutynin (DITROPAN-XL) 5 MG 24 hr tablet Take 5 mg by mouth at bedtime.   Yes [provider]  PARoxetine (PAXIL) 40 MG tablet Take 1.5 tablets (60 mg total) by mouth every morning. 11/17/22  Yes Oletta Darter, MD  acetaminophen (TYLENOL) 325 MG tablet Take 650 mg by mouth every 6 (six) hours as needed for moderate pain.    [provider]  clindamycin (CLINDAGEL) 1 % gel Apply 1 application. topically daily as needed (For lips). Patient not taking: Reported on 12/01/2022    [provider]  Multiple Vitamin (MULTIVITAMIN) capsule Take 1 capsule by mouth daily.    [provider]  potassium chloride 20 MEQ/15ML (10%) SOLN  Take 15 mLs (20 mEq total) by mouth daily. 11/01/22 01/30/23  Michaelyn Barter, MD  Probiotic Product (UP4 PROBIOTICS ADULT PO) Take by mouth daily.    [provider]  promethazine (PHENERGAN) 25 MG tablet Take 1 tablet (25 mg total) by mouth every 6 (six) hours as needed for nausea or vomiting. 01/24/22   Melene Plan, DO  sodium fluoride (FLUORISHIELD) 1.1 % GEL dental gel Place 1 application  onto teeth every other day.    [provider]  valACYclovir (VALTREX) 1000 MG tablet Take 1,000 mg by mouth daily as needed (for fever blisters). 08/24/17    [provider]    Allergies as of 12/01/2022 - Review Complete 11/17/2022  Allergen Reaction Noted   Ceftriaxone Rash 12/28/2021   Nitrofurantoin monohyd macro Nausea And Vomiting 10/14/2011   Sulfa antibiotics Nausea Only and Rash 10/15/2014    Family History  Problem Relation Age of Onset   Nephrolithiasis Mother    Skin cancer Mother        from radiation tx   Hyperlipidemia Mother    Squamous cell carcinoma Mother    CVA Father 59   Fibroids Sister    Other Sister        breast cyst   Skin cancer Sister    Hernia Maternal Grandmother    Pancreatic cancer Maternal Grandfather    Alcohol abuse Paternal Grandmother    Depression Paternal Grandmother    Suicidality Paternal Aunt    Depression Paternal Aunt    Non-Hodgkin's lymphoma Maternal Great-grandmother    Irritable bowel syndrome Other    Anesthesia problems Neg Hx    Hypotension Neg Hx    Malignant hyperthermia Neg Hx    Pseudochol deficiency Neg Hx    Colon cancer Neg Hx    Colon polyps Neg Hx    Esophageal cancer Neg Hx    Stomach cancer Neg Hx    Rectal cancer Neg Hx     Social History   Socioeconomic History   Marital status: Widowed    Spouse name: Not on file   Number of children: 2   Years of education: 6   Highest education level: Not on file  Occupational History   Occupation: Sports coach: [employer]  Tobacco Use   Smoking status: Every Day    Packs/day: 0.25    Years: 30.00    Additional pack years: 0.00    Total pack years: 7.50    Types: Cigarettes   Smokeless tobacco: Never  Vaping Use   Vaping Use: Never used  Substance and Sexual Activity   Alcohol use: No   Drug use: No   Sexual activity: Yes    Birth control/protection: Surgical  Other Topics Concern   Not on file  Social History Narrative   09/12/19   From: [place] > [place] > [employer] as a teenager   Living: Pt lives in [place] with her 2.dogs. Husband died in 2017-03-10she was married for 23  yrs.   Has recently started dating   Work: works at [employer] as a Nurse, learning disability      Family:  Marchelle Folks and Molli Hazard (2 adult children), and 5 grandchildren (1 has passed)       Enjoys: spending time with partner, going out to eat, yardwork and house work       Exercise: not currently - caring for 2 acres, and yard/house work   Diet: graze through the day  Safety   Seat belts: Yes    Guns: No   Safe in relationships: Yes    ------------------   Active smoker; no alcohol; used to work in [employer]; currently unemployed; by self with 2 dogs.             Social Determinants of Health   Financial Resource Strain: Not on file  Food Insecurity: Not on file  Transportation Needs: No Transportation Needs (02/02/2022)   PRAPARE - Administrator, Civil Service (Medical): No    Lack of Transportation (Non-Medical): No  Physical Activity: Not on file  Stress: Not on file  Social Connections: Not on file  Intimate Partner Violence: Not on file    Review of Systems: See HPI, otherwise negative ROS  Physical Exam: BP 123/69   Pulse 69   Temp (!) 97.3 F (36.3 C) (Temporal)   Resp 16   Ht 5\' 8"  (1.727 m)   Wt 88 kg   SpO2 99%   BMI 29.50 kg/m  General:   Alert,  pleasant and cooperative in NAD Head:  Normocephalic and atraumatic. Neck:  Supple; no masses or thyromegaly. Lungs:  Clear throughout to auscultation, normal respiratory effort.    Heart:  +S1, +S2, Regular rate and rhythm, No edema. Abdomen:  Soft, nontender and nondistended. Normal bowel sounds, without guarding, and without rebound.   Neurologic:  Alert and  oriented x4;  grossly normal neurologically.  Impression/Plan: Jennifer [name] is here for an colonoscopy to be performed for surveillance due to prior history of colon polyps   Risks, benefits, limitations, and alternatives regarding  colonoscopy have been reviewed with the patient.  Questions have been answered.  All parties  agreeable.   [name], MD  12/29/2022, 7:37 AM

## 2022-12-29 NOTE — Op Note (Signed)
[facility] Patient Name: Jennifer [name] Procedure Date: 12/29/2022 7:19 AM MRN: [id number] Account #: [id number] Date of Birth: 1966/04/26 Admit Type: Outpatient Age: 57 Room: [facility] ENDO ROOM 3 Gender: Female Note Status: Finalized Instrument Name: Prentice Docker [id number] Procedure:             Colonoscopy Indications:           Surveillance: Personal history of adenomatous polyps                         on last colonoscopy 5 years ago, Last colonoscopy:                         March 2019 Providers:             [name] MD, MD Referring MD:          Chryl Heck. Selena Batten (Referring MD) Medicines:             Monitored Anesthesia Care Complications:         No immediate complications. Procedure:             Pre-Anesthesia Assessment:                        - Prior to the procedure, a History and Physical was                         performed, and patient medications, allergies and                         sensitivities were reviewed. The patient's tolerance                         of previous anesthesia was reviewed.                        - The risks and benefits of the procedure and the                         sedation options and risks were discussed with the                         patient. All questions were answered and informed                         consent was obtained.                        - ASA Grade Assessment: II - A patient with mild                         systemic disease.                        After obtaining informed consent, the colonoscope was                         passed under direct vision. Throughout the procedure,                         the patient's blood pressure,  pulse, and oxygen                         saturations were monitored continuously. The                         Colonoscope was introduced through the anus and                         advanced to the the cecum, identified by the                         appendiceal  orifice. The colonoscopy was performed                         with ease. The patient tolerated the procedure well.                         The quality of the bowel preparation was excellent.                         The ileocecal valve, appendiceal orifice, and rectum                         were photographed. Findings:      Three sessile polyps were found in the sigmoid colon. The polyps were 3       to 5 mm in size. These polyps were removed with a cold snare. Resection       and retrieval were complete.      Normal mucosa was found in the entire colon. Biopsies were taken with a       cold forceps for histology.      The exam was otherwise without abnormality on direct and retroflexion       views. Impression:            - Three 3 to 5 mm polyps in the sigmoid colon, removed                         with a cold snare. Resected and retrieved.                        - Normal mucosa in the entire examined colon. Biopsied.                        - The examination was otherwise normal on direct and                         retroflexion views. Recommendation:        - Discharge patient to home (with escort).                        - Resume previous diet.                        - Continue present medications.                        - Await pathology results.                        -  Repeat colonoscopy for surveillance based on                         pathology results. Procedure Code(s):     --- Professional ---                        [phone], Colonoscopy, flexible; with removal of                         tumor(s), polyp(s), or other lesion(s) by snare                         technique                        45380, 59, Colonoscopy, flexible; with biopsy, single                         or multiple Diagnosis Code(s):     --- Professional ---                        Z86.010, Personal history of colonic polyps                        D12.5, Benign neoplasm of sigmoid colon CPT copyright 2022  American Medical Association. All rights reserved. The codes documented in this report are preliminary and upon coder review may  be revised to meet current compliance requirements. [name], MD [name] MD, MD 12/29/2022 7:56:13 AM This report has been signed electronically. Number of Addenda: 0 Note Initiated On: 12/29/2022 7:19 AM Scope Withdrawal Time: 0 hours 9 minutes 5 seconds  Total Procedure Duration: 0 hours 12 minutes 46 seconds  Estimated Blood Loss:  Estimated blood loss: none.      [facility]

## 2022-12-29 NOTE — Transfer of Care (Signed)
Immediate Anesthesia Transfer of Care Note  Patient: Jennifer [name]  Procedure(s) Performed: COLONOSCOPY WITH PROPOFOL  Patient Location: Endoscopy Unit  Anesthesia Type:General  Level of Consciousness: drowsy and patient cooperative  Airway & Oxygen Therapy: Patient Spontanous Breathing and Patient connected to face mask oxygen  Post-op Assessment: Report given to RN and Post -op Vital signs reviewed and stable  Post vital signs: Reviewed and stable  Last Vitals:  Vitals Value Taken Time  BP 100/58 12/29/22 0757  Temp 36.1 C 12/29/22 0757  Pulse 72 12/29/22 0757  Resp 17 12/29/22 0757  SpO2 98 % 12/29/22 0757    Last Pain:  Vitals:   12/29/22 0757  TempSrc: Temporal  PainSc: Asleep         Complications: No notable events documented.

## 2022-12-30 ENCOUNTER — Encounter: Payer: Self-pay | Admitting: Gastroenterology

## 2023-01-10 ENCOUNTER — Encounter: Payer: Self-pay | Admitting: Family Medicine

## 2023-01-10 ENCOUNTER — Ambulatory Visit: Payer: Managed Care, Other (non HMO) | Admitting: Family Medicine

## 2023-01-10 VITALS — BP 115/80 | HR 73 | Ht 68.0 in | Wt 203.2 lb

## 2023-01-10 DIAGNOSIS — E876 Hypokalemia: Secondary | ICD-10-CM | POA: Diagnosis not present

## 2023-01-10 DIAGNOSIS — R7309 Other abnormal glucose: Secondary | ICD-10-CM

## 2023-01-10 DIAGNOSIS — E669 Obesity, unspecified: Secondary | ICD-10-CM | POA: Diagnosis not present

## 2023-01-10 DIAGNOSIS — E559 Vitamin D deficiency, unspecified: Secondary | ICD-10-CM

## 2023-01-10 DIAGNOSIS — E538 Deficiency of other specified B group vitamins: Secondary | ICD-10-CM

## 2023-01-10 DIAGNOSIS — F39 Unspecified mood [affective] disorder: Secondary | ICD-10-CM

## 2023-01-10 DIAGNOSIS — I700 Atherosclerosis of aorta: Secondary | ICD-10-CM

## 2023-01-10 DIAGNOSIS — E785 Hyperlipidemia, unspecified: Secondary | ICD-10-CM | POA: Diagnosis not present

## 2023-01-10 NOTE — Patient Instructions (Signed)
It was a pleasure meeting you today. Thank you for allowing me to take part in your health care.  Our goals for today as we discussed include:  We will get some labs today.  If they are abnormal or we need to do something about them, I will call you.  If they are normal, I will send you a message on MyChart (if it is active) or a letter in the mail.  If you don't hear from [place] in 2 weeks, please call the office at the number below.   Follow up with OBGYN   Recommend sitz baths frequently  If you have any questions or concerns, please do not hesitate to call the office at [phone].  I look forward to our next visit and until then take care and stay safe.  Regards,   Dana Allan, MD   [facility]

## 2023-01-10 NOTE — Progress Notes (Signed)
SUBJECTIVE:   Chief Complaint  Patient presents with   Transitions Of Care    Patient would like to establish care, Patient would like to have potassium checked   Cyst    Patient has cyst on the right outer labia that she would like to have evaluated   HPI Presents to clinic to transfer care.  Concerns today labial cyst Symptoms started few weeks.  She tried to squeeze area and since then has gotten slightly bigger.  Was unable to express any discharge.  Denies any fever, pain.  Not currently sexually active.  Has upcoming appointment with OB/GYN.   Mood disorder Follows with Dr. Lemar Lofty.  Currently takes Klonopin 1 mg twice daily, Paxil 60 mg daily, and Depakote ER to 50 mg daily.  Was previously on lithium but was discontinued secondary to chronic leukocytosis.  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 Developed C. difficile after multiple doses of antibiotics for treatment of UTI.  Doing well since transplant.  Follows with GI.  Had recent colonoscopy with removal of 3 sessile polyps 3 to 5 mm.  Recommendations to repeat in 5 years.   Chronic leukocytosis Follows with Dr. Zara Council.  Thought to be secondary to lithium use but has significantly improved since discontinuing medication.  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  Constitutional:      General: She is not in acute distress.    Appearance: She is normal weight. She is not ill-appearing.  HENT:     Head: Normocephalic.     Right Ear: Tympanic membrane, ear canal and external ear normal.     Left Ear: Tympanic membrane, ear canal and external ear normal.  Eyes:     Conjunctiva/sclera: Conjunctivae normal.  Neck:     Thyroid: No  thyromegaly or thyroid tenderness.  Cardiovascular:     Rate and Rhythm: Normal rate and regular rhythm.     Pulses: Normal pulses.  Pulmonary:     Effort: Pulmonary effort is normal.     Breath sounds: Normal breath sounds.  Abdominal:     General: Bowel sounds are normal.  Genitourinary:      Comments: Right lower labial firm cyst Lymphadenopathy:     Cervical: No cervical adenopathy.  Neurological:     Mental Status: She is alert. Mental status is at baseline.  Psychiatric:        Mood and Affect: Mood normal.        Behavior: Behavior normal.        Thought Content: Thought content normal.        Judgment: Judgment normal.     ASSESSMENT/PLAN:  Hyperlipidemia, unspecified hyperlipidemia type Assessment & Plan: Chronic. Tolerating statin therapy.  No myalgias Continue Lipitor 10 mg daily Check fasting lipids  Orders: -     Lipid panel  Hypomagnesemia -     Comprehensive metabolic panel  Hypokalemia Assessment & Plan: Has been off potassium for 2 weeks.  Asymptomatic Recheck potassium  Orders: -     Comprehensive metabolic panel  Class 1 obesity without serious comorbidity in adult, unspecified BMI, unspecified obesity type -     CBC with  Differential/Platelet  Abnormal glucose -     Hemoglobin A1c  Vitamin B 12 deficiency -     Vitamin B12  Vitamin D deficiency -     VITAMIN D 25 Hydroxy (Vit-D Deficiency, Fractures)  Mood disorder (HCC) Assessment & Plan: Chronic.  Doing well on current medications Takes Klonipin 0.5 mg qhs, Depakote ER 250 mg daily, and Paxil 60 mg daily Follows with psychiatry.   Aortic atherosclerosis (HCC) Assessment & Plan: Chronic. On statin and tolerating well. Continue Lipitor 10 mg daily    PDMP reviewed  Return in about 2 months (around 03/12/2023).  Dana Allan, MD

## 2023-01-11 LAB — CBC WITH DIFFERENTIAL/PLATELET
Basophils Absolute: 0.2 10*3/uL — ABNORMAL HIGH (ref 0.0–0.1)
Basophils Relative: 1.2 % (ref 0.0–3.0)
Eosinophils Absolute: 0.2 10*3/uL (ref 0.0–0.7)
Eosinophils Relative: 1.7 % (ref 0.0–5.0)
HCT: 46 % (ref 36.0–46.0)
Hemoglobin: 15.3 g/dL — ABNORMAL HIGH (ref 12.0–15.0)
Lymphocytes Relative: 34.2 % (ref 12.0–46.0)
Lymphs Abs: 4.3 10*3/uL — ABNORMAL HIGH (ref 0.7–4.0)
MCHC: 33.2 g/dL (ref 30.0–36.0)
MCV: 88.1 fl (ref 78.0–100.0)
Monocytes Absolute: 0.8 10*3/uL (ref 0.1–1.0)
Monocytes Relative: 6.1 % (ref 3.0–12.0)
Neutro Abs: 7.2 10*3/uL (ref 1.4–7.7)
Neutrophils Relative %: 56.8 % (ref 43.0–77.0)
Platelets: 315 10*3/uL (ref 150.0–400.0)
RBC: 5.22 Mil/uL — ABNORMAL HIGH (ref 3.87–5.11)
RDW: 14.3 % (ref 11.5–15.5)
WBC: 12.7 10*3/uL — ABNORMAL HIGH (ref 4.0–10.5)

## 2023-01-11 LAB — COMPREHENSIVE METABOLIC PANEL
ALT: 12 U/L (ref 0–35)
AST: 16 U/L (ref 0–37)
Albumin: 4.2 g/dL (ref 3.5–5.2)
Alkaline Phosphatase: 83 U/L (ref 39–117)
BUN: 10 mg/dL (ref 6–23)
CO2: 24 mEq/L (ref 19–32)
Calcium: 9.1 mg/dL (ref 8.4–10.5)
Chloride: 104 mEq/L (ref 96–112)
Creatinine, Ser: 0.77 mg/dL (ref 0.40–1.20)
GFR: 84.49 mL/min (ref >60.00)
Glucose, Bld: 89 mg/dL (ref 70–99)
Potassium: 3.4 mEq/L — ABNORMAL LOW (ref 3.5–5.1)
Sodium: 141 mEq/L (ref 135–145)
Total Bilirubin: 0.3 mg/dL (ref 0.2–1.2)
Total Protein: 6.9 g/dL (ref 6.0–8.3)

## 2023-01-11 LAB — LIPID PANEL
Cholesterol: 139 mg/dL (ref 0–200)
HDL: 37.7 mg/dL — ABNORMAL LOW (ref >39.00)
LDL Cholesterol: 75 mg/dL (ref 0–99)
NonHDL: 101.01
Total CHOL/HDL Ratio: 4
Triglycerides: 130 mg/dL (ref 0.0–149.0)
VLDL: 26 mg/dL (ref 0.0–40.0)

## 2023-01-11 LAB — HEMOGLOBIN A1C: Hgb A1c MFr Bld: 6 % (ref 4.6–6.5)

## 2023-01-11 LAB — VITAMIN D 25 HYDROXY (VIT D DEFICIENCY, FRACTURES): VITD: 37.41 ng/mL (ref 30.00–100.00)

## 2023-01-11 LAB — VITAMIN B12: Vitamin B-12: 143 pg/mL — ABNORMAL LOW (ref 211–911)

## 2023-01-12 ENCOUNTER — Other Ambulatory Visit: Payer: Self-pay | Admitting: Internal Medicine

## 2023-01-12 ENCOUNTER — Telehealth: Payer: Self-pay | Admitting: *Deleted

## 2023-01-12 DIAGNOSIS — E876 Hypokalemia: Secondary | ICD-10-CM

## 2023-01-12 NOTE — Telephone Encounter (Signed)
Component Ref Range & Units 2 d ago (01/10/23) 2 mo ago (11/01/22) 6 mo ago (07/04/22) 6 mo ago (06/27/22) 8 mo ago (04/18/22) 9 mo ago (03/30/22) 10 mo ago (03/04/22)  Potassium 3.5 - 5.1 mEq/L 3.4 Low  2.8 Low  R 3.0 Low  R 2.3 Low Panic  R, CM 3.2 Low  3.2 Low  2.9 Low

## 2023-01-15 ENCOUNTER — Encounter: Payer: Self-pay | Admitting: Family Medicine

## 2023-01-15 DIAGNOSIS — E538 Deficiency of other specified B group vitamins: Secondary | ICD-10-CM | POA: Insufficient documentation

## 2023-01-15 DIAGNOSIS — E559 Vitamin D deficiency, unspecified: Secondary | ICD-10-CM | POA: Insufficient documentation

## 2023-01-15 DIAGNOSIS — R7309 Other abnormal glucose: Secondary | ICD-10-CM | POA: Insufficient documentation

## 2023-01-15 NOTE — Assessment & Plan Note (Signed)
Chronic. On statin and tolerating well. Continue Lipitor 10 mg daily

## 2023-01-15 NOTE — Assessment & Plan Note (Signed)
Chronic. Tolerating statin therapy.  No myalgias Continue Lipitor 10 mg daily Check fasting lipids

## 2023-01-15 NOTE — Assessment & Plan Note (Signed)
Has been off potassium for 2 weeks.  Asymptomatic Recheck potassium

## 2023-01-15 NOTE — Assessment & Plan Note (Signed)
Chronic.  Doing well on current medications Takes Klonipin 0.5 mg qhs, Depakote ER 250 mg daily, and Paxil 60 mg daily Follows with psychiatry.

## 2023-01-16 ENCOUNTER — Other Ambulatory Visit: Payer: Self-pay | Admitting: Family Medicine

## 2023-01-16 DIAGNOSIS — E538 Deficiency of other specified B group vitamins: Secondary | ICD-10-CM

## 2023-01-16 MED ORDER — VITAMIN B-12 1000 MCG SL SUBL
1000.0000 ug | SUBLINGUAL_TABLET | Freq: Every day | SUBLINGUAL | 3 refills | Status: DC
Start: 2023-01-16 — End: 2024-01-18

## 2023-01-19 ENCOUNTER — Telehealth (HOSPITAL_COMMUNITY): Payer: 59 | Admitting: Psychiatry

## 2023-01-19 ENCOUNTER — Encounter: Payer: Self-pay | Admitting: Family Medicine

## 2023-01-19 DIAGNOSIS — F4310 Post-traumatic stress disorder, unspecified: Secondary | ICD-10-CM | POA: Diagnosis not present

## 2023-01-19 DIAGNOSIS — F411 Generalized anxiety disorder: Secondary | ICD-10-CM | POA: Diagnosis not present

## 2023-01-19 DIAGNOSIS — F332 Major depressive disorder, recurrent severe without psychotic features: Secondary | ICD-10-CM | POA: Diagnosis not present

## 2023-01-19 MED ORDER — DIVALPROEX SODIUM ER 250 MG PO TB24
250.0000 mg | ORAL_TABLET | Freq: Every day | ORAL | 1 refills | Status: DC
Start: 2023-01-19 — End: 2023-08-21

## 2023-01-19 MED ORDER — CLONAZEPAM 0.5 MG PO TABS
1.0000 mg | ORAL_TABLET | Freq: Every day | ORAL | 3 refills | Status: DC | PRN
Start: 2023-01-19 — End: 2023-05-15

## 2023-01-19 MED ORDER — PAROXETINE HCL 40 MG PO TABS: 60.0000 mg | ORAL_TABLET | ORAL | 1 refills | Status: DC

## 2023-01-19 NOTE — Progress Notes (Signed)
Virtual Visit via Video Note  I connected with Jennifer [name] on 01/19/23 at  4:15 PM EDT by a video enabled telemedicine application and verified that I am speaking with the correct person using two identifiers.  Location: Patient: Home Provider: office   I discussed the limitations of evaluation and management by telemedicine and the availability of in person appointments. The patient expressed understanding and agreed to proceed.  History of Present Illness: "I'm doing ok. I'm a little sad because this is our last visit". Overall she is doing well and feels more stable. She had a recent colonoscopy and it went well. She has a new PCP and was started on Vitamin B12. She is not as angry and feels calmer overall. Before the irritability was overwhelming. She denies any verbal outbursts. Jennifer [name] feels tired but is sleeping well. At times the fatigue makes her unmotivated. Her depression is stable. She denies hopelessness and anhedonia. She often feels she has enough time to do things she wants to. She denies SI/HI and passive thoughts of death. Her anxiety is now situational. Even when it comes on it is mild and manageable. On a day to day basis she has some anxiety in the evening with htoughts of chores that need to be done. She takes 0.5mg  of the Klonopin in the evening and it helps to clear her head. She denies PTSD symptoms.     Observations/Objective: Psychiatric Specialty Exam: ROS  There were no vitals taken for this visit.There is no height or weight on file to calculate BMI.  General Appearance: Casual and Fairly Groomed  Eye Contact:  Good  Speech:  Clear and Coherent and Normal Rate  Volume:  Normal  Mood:  Euthymic  Affect:  Full Range  Thought Process:  Goal Directed, Linear, and Descriptions of Associations: Intact  Orientation:  Full (Time, Place, and Person)  Thought Content:  Logical  Suicidal Thoughts:  No  Homicidal Thoughts:  No  Memory:  Immediate;   Good   Judgement:  Good  Insight:  Good  Psychomotor Activity:  Normal  Concentration:  Concentration: Good  Recall:  Good  Fund of Knowledge:  Good  Language:  Good  Akathisia:  No  Handed:  Right  AIMS (if indicated):     Assets:  Communication Skills Desire for Improvement Financial Resources/Insurance Housing Intimacy Leisure Time Resilience Social Support Talents/Skills Transportation Vocational/Educational  ADL's:  Intact  Cognition:  WNL  Sleep:        Assessment and Plan:     01/19/2023    4:14 PM 01/10/2023    3:17 PM 12/01/2022    4:06 PM 11/17/2022    3:39 PM 09/01/2022    3:50 PM  Depression screen PHQ 2/9  Decreased Interest 0 0 1 1 0  Down, Depressed, Hopeless 1 1 2 3  0  PHQ - 2 Score 1 1 3 4  0  Altered sleeping 0 0 0 0   Tired, decreased energy 2 1 1 3    Change in appetite 0 0 0 0   Feeling bad or failure about yourself  0 0 1 0   Trouble concentrating 0 0 0 0   Moving slowly or fidgety/restless 0 0 0 0   Suicidal thoughts 0 0 0 1   PHQ-9 Score 3 2 5 8    Difficult doing work/chores Not difficult at all Not difficult at all Somewhat difficult Very difficult     Flowsheet Row Video Visit from [facility] in [facility] Admission (Discharged) from 12/29/2022 in [facility] Video Visit from 12/01/2022 in [facility]  C-SSRS RISK CATEGORY Error: Q3, 4, or 5 should not be populated when Q2 is No No Risk Error: Q3, 4, or 5 should not be populated when Q2 is No          Pt is aware that these meds carry a teratogenic risk. Pt will discuss plan of action if she does or plans to become pregnant in the future.  Status of current problems: stable   Medication management with supportive therapy. Risks and benefits, side effects and alternative treatment options discussed with patient. Pt was given an opportunity to ask questions about medication, illness, and  treatment. All current psychiatric medications have been reviewed and discussed with the patient and adjusted as clinically appropriate.  Pt verbalized understanding and verbal consent obtained for treatment.  Meds: decrease Klonopin 0.5mg  po qD prn anxiety 1. GAD (generalized anxiety disorder) - clonazePAM (KLONOPIN) 0.5 MG tablet; Take 2 tablets (1 mg total) by mouth daily as needed for anxiety.  Dispense: 30 tablet; Refill: 3 - PARoxetine (PAXIL) 40 MG tablet; Take 1.5 tablets (60 mg total) by mouth every morning.  Dispense: 135 tablet; Refill: 1  2. Severe episode of recurrent major depressive disorder, without psychotic features (HCC) - divalproex (DEPAKOTE ER) 250 MG 24 hr tablet; Take 1 tablet (250 mg total) by mouth daily.  Dispense: 90 tablet; Refill: 1 - PARoxetine (PAXIL) 40 MG tablet; Take 1.5 tablets (60 mg total) by mouth every morning.  Dispense: 135 tablet; Refill: 1  3. PTSD (post-traumatic stress disorder) - PARoxetine (PAXIL) 40 MG tablet; Take 1.5 tablets (60 mg total) by mouth every morning.  Dispense: 135 tablet; Refill: 1     Labs: none today    Therapy: brief supportive therapy provided. Discussed psychosocial stressors in detail.     Collaboration of Care: Other none  Patient/Guardian was advised Release of Information must be obtained prior to any record release in order to collaborate their care with an outside provider. Patient/Guardian was advised if they have not already done so to contact the registration department to sign all necessary forms in order for [place] to release information regarding their care.   Consent: Patient/Guardian gives verbal consent for treatment and assignment of benefits for services provided during this visit. Patient/Guardian expressed understanding and agreed to proceed.     Follow Up Instructions: Follow up in 5-6 months or sooner if needed with a new psychiatrist  Patient informed that I am leaving [facility] in 02/2023 and I relayed  that they will be getting a new provider after that. Patient verbalized understanding and agreed with the plan.     I discussed the assessment and treatment plan with the patient. The patient was provided an opportunity to ask questions and all were answered. The patient agreed with the plan and demonstrated an understanding of the instructions.   The patient was advised to call back or seek an in-person evaluation if the symptoms worsen or if the condition fails to improve as anticipated.  I provided 14 minutes of non-face-to-face time during this encounter.   Oletta Darter, MD

## 2023-01-24 ENCOUNTER — Telehealth (HOSPITAL_COMMUNITY): Payer: Self-pay | Admitting: Psychiatry

## 2023-02-06 ENCOUNTER — Telehealth (HOSPITAL_COMMUNITY): Payer: Self-pay | Admitting: Psychiatry

## 2023-02-06 NOTE — Telephone Encounter (Signed)
10:00am 07./01/24 This msg was received from [place] (cma) -Maddie with [employer] called on behalf of this patient - patient said we are charging a $50 copay and insurance said it should be $30. They would like a call back to verify you have the correct insurance. Reference # [id number], cb# [phone]   10:01am I called [phone] spoke with Victorino Dike in reference to the above call from [facility] and also in EPIC is shows the insurance has elapsed - per Victorino Dike the insurance the insurance is active all the the information was verified.  Ran the insurance from [employer] and it shows the insurance the member is eligible effective date 08/01/2005 - 08/08/23 Reference#[id number].  Victorino Dike is going to fax over an EOB to show the co-pay is $30 not $50../sh

## 2023-02-22 ENCOUNTER — Telehealth: Payer: Self-pay

## 2023-02-22 NOTE — Telephone Encounter (Signed)
noted 

## 2023-02-22 NOTE — Telephone Encounter (Signed)
Dr. Lorenza Burton is calling to state patient recently had a root canal and she is still experiencing a little swelling, which is resolving.  Dr. Clare Charon would like to know if Dr. Dana Allan would approve patient to have antibiotics (broad spectrum like possibly amoxcillin) given her history with c-diff and she would also like to know if Dr. Clent Ridges would approve giving patient prednisone.  I transferred call to Dr. Duncan Dull in Dr. Claris Che absence.

## 2023-02-23 ENCOUNTER — Other Ambulatory Visit: Payer: Self-pay | Admitting: Internal Medicine

## 2023-02-23 DIAGNOSIS — E876 Hypokalemia: Secondary | ICD-10-CM

## 2023-02-28 DIAGNOSIS — A0472 Enterocolitis due to Clostridium difficile, not specified as recurrent: Secondary | ICD-10-CM | POA: Insufficient documentation

## 2023-03-05 ENCOUNTER — Encounter: Payer: Self-pay | Admitting: Family Medicine

## 2023-03-06 NOTE — Telephone Encounter (Signed)
Pt is calling about mychart message

## 2023-03-08 NOTE — Telephone Encounter (Signed)
Patient called and has not heard back from Dr Clent Ridges. Patient said that [facility] might of also sent a fax to Dr Clent Ridges. Patient is at work, if office calls and she does not answer, please leave a message.

## 2023-03-08 NOTE — Telephone Encounter (Signed)
Left message to return call to our office.  

## 2023-03-08 NOTE — Telephone Encounter (Signed)
PT is having back soreness and very tired.

## 2023-03-08 NOTE — Telephone Encounter (Signed)
Pt stated that they tested her urine at [facility] as a routine checkup.

## 2023-03-09 NOTE — Telephone Encounter (Signed)
Patient called and Dr Claris Che note was read. Patient will call OBGYN.

## 2023-03-10 ENCOUNTER — Encounter: Payer: Self-pay | Admitting: Gastroenterology

## 2023-03-10 ENCOUNTER — Telehealth: Payer: Self-pay | Admitting: Gastroenterology

## 2023-03-10 NOTE — Telephone Encounter (Signed)
Per patient  Jennifer [name] [birth year]   Have new script from OB-GYN.Marland Kitchen Urine test came back abnormal.   Augmenting 500-125 tablet 1 tab every 8 hours for 5 days Qty-15   Please advise, Dorie Rank [phone]   P.S. I also left message on [phone] phone.

## 2023-03-13 ENCOUNTER — Other Ambulatory Visit: Payer: Self-pay

## 2023-03-13 MED ORDER — VANCOMYCIN HCL 125 MG PO CAPS: 125.0000 mg | ORAL_CAPSULE | Freq: Every day | ORAL | 0 refills | Status: DC

## 2023-03-13 NOTE — Progress Notes (Signed)
SUBJECTIVE:   Chief Complaint  Patient presents with   Medical Management of Chronic Issues   Diarrhea    Friday started having diarrhea stool has cake batter consistency    Fatigue   HPI Presents to clinic for acute visit  Recently seen OBGYN and urine was collected.  Positive for leuks, culture sent and positive for E.Coli > 100000 cfu.  Patient concerned about starting antibiotics given fecal transplant 06/2022.  She reports she was not symptomatic at the time the urine was collected.  She was prescribed Augmentin for E.Coli and she reports that she voiced concern of taking antibiotic with history of Fecal Transplant for C Diff.  Patient reports that she was told she could go septic so she picked up medication and started. She then called her GI doctor who started her on Vancomycin for C Diff prophylaxis and was advised to continue this for 1 week.   She now reports that for the past 4 days she has had diarrhea that has now turned to a paste consistency.  Denies any fevers, abdominal pain, nausea or vomiting.  Denies any bloody stool, hematuria or urinary symptoms.     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  Constitutional:      General: She is not in acute distress.    Appearance: Normal appearance. She is normal weight. She is not ill-appearing, toxic-appearing or diaphoretic.  Eyes:     General:        Right eye: No discharge.        Left eye: No discharge.     Conjunctiva/sclera: Conjunctivae normal.  Cardiovascular:     Rate and Rhythm: Normal rate and regular rhythm.     Heart sounds: Normal heart sounds.  Pulmonary:     Effort: Pulmonary effort is normal.     Breath sounds: Normal breath sounds.  Abdominal:     General: Bowel sounds  are normal. There is no distension.     Palpations: Abdomen is soft.     Tenderness: There is no abdominal tenderness.  Musculoskeletal:        General: Normal range of motion.  Skin:    General: Skin is warm and dry.  Neurological:     General: No focal deficit present.     Mental Status: She is alert and oriented to person, place, and time. Mental status is at baseline.  Psychiatric:        Mood and Affect: Mood is anxious.        Behavior: Behavior normal.        Thought Content: Thought content normal.        Judgment: Judgment normal.     ASSESSMENT/PLAN:  Diarrhea due to drug Assessment & Plan: Diarrhea improving  Likely from treatment with Augmentin for asymptomatic UTI Continue Vancomycin as prescribed by GI Discussed with patient to not have urine tested randomly by providers if not symptomatic. If truly symptomatic can have sample and to only be treated when culture returns given her history of Fecal transplant for C Diff from prescribed antibiotics for UTI. Check Cmet, CBC to ensure adequately hydrated    Orders: -  Basic metabolic panel -     CBC  Vitamin B 12 deficiency Assessment & Plan: Check B 12 level  Orders: -     Vitamin B12    PDMP reviewed  Return if symptoms worsen or fail to improve, for PCP.  Dana Allan, MD

## 2023-03-13 NOTE — Patient Instructions (Incomplete)
It was a pleasure meeting you today. Thank you for allowing me to take part in your health care.  Our goals for today as we discussed include:  For your***   For your***   If you have any questions or concerns, please do not hesitate to call the office at [phone].  I look forward to our next visit and until then take care and stay safe.  Regards,   Dana Allan, MD   [facility]

## 2023-03-14 ENCOUNTER — Encounter: Payer: Self-pay | Admitting: Family Medicine

## 2023-03-14 ENCOUNTER — Ambulatory Visit: Payer: Managed Care, Other (non HMO) | Admitting: Family Medicine

## 2023-03-14 VITALS — BP 102/60 | HR 71 | Temp 97.0°F | Resp 16 | Ht 68.0 in | Wt 210.0 lb

## 2023-03-14 DIAGNOSIS — K521 Toxic gastroenteritis and colitis: Secondary | ICD-10-CM

## 2023-03-14 DIAGNOSIS — E538 Deficiency of other specified B group vitamins: Secondary | ICD-10-CM | POA: Diagnosis not present

## 2023-03-15 NOTE — Telephone Encounter (Signed)
Please read patient message from 03/10/23.

## 2023-03-20 ENCOUNTER — Encounter (HOSPITAL_COMMUNITY): Payer: Self-pay | Admitting: Student

## 2023-03-20 ENCOUNTER — Ambulatory Visit (HOSPITAL_COMMUNITY): Payer: 59 | Admitting: Student

## 2023-03-20 VITALS — BP 137/76 | HR 85 | Ht 68.0 in | Wt 212.0 lb

## 2023-03-20 DIAGNOSIS — F4310 Post-traumatic stress disorder, unspecified: Secondary | ICD-10-CM

## 2023-03-20 DIAGNOSIS — F330 Major depressive disorder, recurrent, mild: Secondary | ICD-10-CM | POA: Diagnosis not present

## 2023-03-20 DIAGNOSIS — F332 Major depressive disorder, recurrent severe without psychotic features: Secondary | ICD-10-CM
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
## 2023-03-23 DIAGNOSIS — F4310 Post-traumatic stress disorder, unspecified: Secondary | ICD-10-CM | POA: Insufficient documentation

## 2023-03-24 NOTE — Addendum Note (Signed)
Addended by: Theodoro Kos A on: 03/24/2023 01:21 PM   Modules accepted: Level of Service

## 2023-03-26 ENCOUNTER — Encounter: Payer: Self-pay | Admitting: Family Medicine

## 2023-03-26 DIAGNOSIS — K521 Toxic gastroenteritis and colitis: Secondary | ICD-10-CM | POA: Insufficient documentation

## 2023-03-26 NOTE — Assessment & Plan Note (Addendum)
Diarrhea improving  Likely from treatment with Augmentin for asymptomatic UTI Continue Vancomycin as prescribed by GI Discussed with patient to not have urine tested randomly by providers if not symptomatic. If truly symptomatic can have sample and to only be treated when culture returns given her history of Fecal transplant for C Diff from prescribed antibiotics for UTI. Check Cmet, CBC to ensure adequately hydrated

## 2023-03-26 NOTE — Assessment & Plan Note (Signed)
Check B12 level. 

## 2023-04-05 ENCOUNTER — Ambulatory Visit: Payer: Managed Care, Other (non HMO) | Admitting: Family Medicine

## 2023-04-05 ENCOUNTER — Encounter: Payer: Self-pay | Admitting: Family Medicine

## 2023-04-05 ENCOUNTER — Telehealth: Payer: Self-pay | Admitting: Family Medicine

## 2023-04-05 VITALS — BP 104/66 | HR 70 | Temp 97.8°F | Resp 16 | Ht 68.0 in | Wt 213.0 lb

## 2023-04-05 DIAGNOSIS — J340 Abscess, furuncle and carbuncle of nose: Secondary | ICD-10-CM

## 2023-04-05 DIAGNOSIS — R220 Localized swelling, mass and lump, head: Secondary | ICD-10-CM | POA: Insufficient documentation

## 2023-04-05 NOTE — Assessment & Plan Note (Signed)
Edema, erythema, induration and pain of upper [place] border s/p dental procedure Concern for abscess v/s cellulitis Will obtain stat imaging for further evaluation

## 2023-04-05 NOTE — Progress Notes (Signed)
   SUBJECTIVE:   Chief Complaint  Patient presents with   Facial Swelling    Lip X 2 weeks   HPI Presents to clinic for acute visit  Had root canal 6 weeks ago.  Developed infection.  Crown postponed.  Had some swelling at that time.  Recently had cold sore and took one dose Valtrex.  Swelling progressed on upper lip travelling upward to nasal area.  Denies any fevers, tongue swelling, difficulty breathing.  PERTINENT PMH / PSH: Fecal transplant s/p C.Diff infection  OBJECTIVE:  BP 104/66   Pulse 70   Temp 97.8 F (36.6 C)   Resp 16   Ht 5\' 8"  (1.727 m)   Wt 213 lb (96.6 kg)   SpO2 98%   BMI 32.39 kg/m    Physical Exam HENT:     Mouth/Throat:     Mouth: Mucous membranes are moist.      Comments: Edema, erythema, induration of left upper lip extending up to nasal septum and outward         04/05/2023   12:00 PM 03/14/2023    2:56 PM 01/19/2023    4:14 PM 01/10/2023    3:17 PM 12/01/2022    4:06 PM  Depression screen PHQ 2/9  Decreased Interest 1 1  0   Down, Depressed, Hopeless 1 1  1    PHQ - 2 Score 2 2  1    Altered sleeping 1 0  0   Tired, decreased energy 3 3  1    Change in appetite 0 0  0   Feeling bad or failure about yourself  1 0  0   Trouble concentrating 0 0  0   Moving slowly or fidgety/restless 0 0  0   Suicidal thoughts 0 0  0   PHQ-9 Score 7 5  2    Difficult doing work/chores Somewhat difficult Not difficult at all  Not difficult at all      Information is confidential and restricted. Go to Review Flowsheets to unlock data.      04/05/2023   12:00 PM 03/14/2023    2:56 PM 01/10/2023    3:15 PM 02/24/2022   11:08 AM  GAD 7 : Generalized Anxiety Score  Nervous, Anxious, on Edge 1 1 1 3   Control/stop worrying 1 2 1 2   Worry too much - different things 1 2 1 2   Trouble relaxing 1 1 1 1   Restless 1 0 1 1  Easily annoyed or irritable 1 0 0 0  Afraid - awful might happen 1 0 0 1  Total GAD 7 Score 7 6 5 10   Anxiety Difficulty Somewhat difficult  Somewhat difficult Not difficult at all     ASSESSMENT/PLAN:  Abscess, furuncle and carbuncle of nose -     CT MAXILLOFACIAL W CONTRAST; Future  Swelling of upper lip Assessment & Plan: Edema, erythema, induration and pain of upper [place] border s/p dental procedure Concern for abscess v/s cellulitis Will obtain stat imaging for further evaluation      PDMP reviewed  No follow-ups on file.  Dana Allan, MD

## 2023-04-05 NOTE — Telephone Encounter (Signed)
Dr. Hannah Beat called and said she feels the patient doesn't need a CT Scan at this time concerning her lip. She states she will see the patient tomorrow. If you have any questions please call [phone].Her name is Dr. Rosana Hoes

## 2023-05-02 ENCOUNTER — Inpatient Hospital Stay: Payer: Managed Care, Other (non HMO) | Attending: Internal Medicine

## 2023-05-02 ENCOUNTER — Inpatient Hospital Stay: Payer: Managed Care, Other (non HMO) | Admitting: Internal Medicine

## 2023-05-02 DIAGNOSIS — D72829 Elevated white blood cell count, unspecified: Secondary | ICD-10-CM | POA: Insufficient documentation

## 2023-05-02 DIAGNOSIS — E876 Hypokalemia: Secondary | ICD-10-CM | POA: Diagnosis not present

## 2023-05-02 DIAGNOSIS — D72828 Other elevated white blood cell count: Secondary | ICD-10-CM

## 2023-05-02 LAB — CBC WITH DIFFERENTIAL (CANCER CENTER ONLY)
Abs Immature Granulocytes: 0.18 10*3/uL — ABNORMAL HIGH (ref 0.00–0.07)
Basophils Absolute: 0.1 10*3/uL (ref 0.0–0.1)
Basophils Relative: 1 %
Eosinophils Absolute: 0.2 10*3/uL (ref 0.0–0.5)
Eosinophils Relative: 2 %
HCT: 46.1 % — ABNORMAL HIGH (ref 36.0–46.0)
Hemoglobin: 15.5 g/dL — ABNORMAL HIGH (ref 12.0–15.0)
Immature Granulocytes: 2 %
Lymphocytes Relative: 37 %
Lymphs Abs: 4.1 10*3/uL — ABNORMAL HIGH (ref 0.7–4.0)
MCH: 29.8 pg (ref 26.0–34.0)
MCHC: 33.6 g/dL (ref 30.0–36.0)
MCV: 88.5 fL (ref 80.0–100.0)
Monocytes Absolute: 0.7 10*3/uL (ref 0.1–1.0)
Monocytes Relative: 6 %
Neutro Abs: 5.8 10*3/uL (ref 1.7–7.7)
Neutrophils Relative %: 52 %
Platelet Count: 277 10*3/uL (ref 150–400)
RBC: 5.21 MIL/uL — ABNORMAL HIGH (ref 3.87–5.11)
RDW: 13.3 % (ref 11.5–15.5)
WBC Count: 11 10*3/uL — ABNORMAL HIGH (ref 4.0–10.5)
nRBC: 0 % (ref 0.0–0.2)

## 2023-05-02 LAB — BASIC METABOLIC PANEL - CANCER CENTER ONLY
Anion gap: 8 (ref 5–15)
BUN: 12 mg/dL (ref 6–20)
CO2: 25 mmol/L (ref 22–32)
Calcium: 8.6 mg/dL — ABNORMAL LOW (ref 8.9–10.3)
Chloride: 105 mmol/L (ref 98–111)
Creatinine: 0.79 mg/dL (ref 0.44–1.00)
GFR, Estimated: >60 mL/min (ref >60)
Glucose, Bld: 115 mg/dL — ABNORMAL HIGH (ref 70–99)
Potassium: 3.2 mmol/L — ABNORMAL LOW (ref 3.5–5.1)
Sodium: 138 mmol/L (ref 135–145)

## 2023-05-02 NOTE — Addendum Note (Signed)
Addended by: Lesle Chris on: 05/02/2023 04:03 PM   Modules accepted: Orders

## 2023-05-02 NOTE — Assessment & Plan Note (Addendum)
#   LEUCOCYTOSIS/neutrophilia/lymphocytosis monocytosis-chronic [2010 mild]; May 2023-White count 50,000 [pyelonephritis/C.diff]-more recently to 11K- [OFF Lithium since 2023].   # JUNE 2023- peripheral blood flow cytometry- non-specific ; BCR ABL FISH-NEGATIVGE; JAK2 mutation CALR/MPL-NEGATIVE. Discussed regarding bone marrow biopsy- given continued leucocytosis However, hold bone marrow biopsy at this time.  # Elevated HCT- likely secondary- / OSA- smoking.  Recommend speaking to PCP regarding sleep study.  # Hypokalemia: 3.2- [difficulty with swallowing the pills]-.  Defer to PCP regarding potassium supplementation.  # BiPolar [Dr.Agarwal;[facility]] -clinically stable on Depakote  # B12 deficiency- on PO B12 pills.   # Lung cancer screening- last scan in 2021-stable.  Patient declined further lung cancer screening.  # Discussed with the patient regarding the ill effects of smoking- including but not limited to cardiac lung and vascular diseases and malignancies. Counseled against smoking; patient-not interested in quitting.   mychart-   # DISPOSITION: # follow up in 12 months- MD; cbc/cmp; [facility]- Dr.B  Cc; Dr.Cody; Michae Kava

## 2023-05-02 NOTE — Progress Notes (Signed)
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 likley related to Lithium.  predominant neutrophilia; lymphocytosis; monocytosis  Hb- 16; platelets- 436 [LL-400];   # JUNE 2023- peripheral blood flow cytometry- non-specific ; BCR ABL FISH-NEGATIVGE; JAK2 mutation CALR/MPL-NEGATIVE. Discussed regarding bone marrow biopsy- given continued leucocytosis However, hold bone marrow biopsy at this time.  #Bipolar disorder/Depression-on lithium [Dr.Agarwal; [facility]]  1. Obstructing 6 mm calculus in the proximal third of the left ureter resulting in mild left hydroureteronephrosis. 2. Findings suggestive of hepatic steatosis.   Aortic Atherosclerosis (ICD10-I70.0).  Oncology History   No history exists.     HPI: with mother; ambulating independently.  Jennifer [name] 57 y.o.  female pleasant patient depression/bipolar currently here for follow-up to review the results of her blood work for elevated white count.  Currently on Depakote.  Taken off lithium because of tremors.  Unfortunately continues to smoke.  Does complain of ongoing fatigue.  Admits to snoring.   Review of Systems  Constitutional:  Positive for malaise/fatigue. Negative for chills, diaphoresis, fever and weight loss.  HENT:  Negative for nosebleeds and sore throat.   Eyes:  Negative for double vision.  Respiratory:  Negative for cough, hemoptysis, sputum production, shortness of breath and wheezing.   Cardiovascular:  Negative for chest pain, palpitations, orthopnea and leg swelling.  Gastrointestinal:  Negative for blood in stool, constipation, diarrhea, heartburn and melena.  Genitourinary:  Negative for  dysuria, frequency and urgency.  Musculoskeletal:  Positive for back pain and joint pain.  Skin: Negative.  Negative for itching and rash.  Neurological:  Positive for weakness. Negative for tingling, focal weakness and headaches.  Endo/Heme/Allergies:  Does not bruise/bleed easily.  Psychiatric/Behavioral:  Negative for depression. The patient is not nervous/anxious and does not have insomnia.       PAST MEDICAL HISTORY :  Past Medical History:  Diagnosis Date   Anxiety    Cancer (HCC)    Depression    Diverticulosis    Fundic gland polyps of stomach, benign    History of kidney stones    Low HDL (under 40)    Personal history of adenomatous colonic polyps 05/08/2012   Rosacea    Tenosynovitis, de Quervain    Urosepsis 10/2011   after stent    PAST SURGICAL HISTORY :   Past Surgical History:  Procedure Laterality Date   CERVICAL BIOPSY  W/ LOOP ELECTRODE EXCISION  04/2003   COLONOSCOPY     COLONOSCOPY WITH PROPOFOL N/A 12/29/2022   Procedure: COLONOSCOPY WITH PROPOFOL;  Surgeon: [name], MD;  Location: [facility] ENDOSCOPY;  Service: Gastroenterology;  Laterality: N/A;   CYSTOSCOPY W/ URETERAL STENT PLACEMENT  10/14/2011   Procedure: CYSTOSCOPY WITH RETROGRADE PYELOGRAM/URETERAL STENT PLACEMENT;  Surgeon: Lindaann Slough, MD;  Location: [facility] ORS;  Service: Urology;  Laterality: Right;  Right Double J Stent   CYSTOSCOPY W/ URETERAL STENT PLACEMENT Left 12/26/2021   Procedure: CYSTOSCOPY WITH RETROGRADE PYELOGRAM/URETERAL STENT PLACEMENT;  Surgeon: Crist Fat, MD;  Location: [facility] OR;  Service: Urology;  Laterality: Left;   CYSTOSCOPY/URETEROSCOPY/HOLMIUM LASER/STENT PLACEMENT Left 01/19/2022   Procedure: LEFT URETEROSCOPY/STENT EXCHANGE/BASKET STONE REMOVAL;  Surgeon: Crist Fat, MD;  Location: [facility] ORS;  Service: Urology;  Laterality: Left;   MULTIPLE TOOTH EXTRACTIONS     OOPHORECTOMY  left ovary   POLYPECTOMY     TUBAL LIGATION     UMBILICAL HERNIA REPAIR      UPPER GASTROINTESTINAL ENDOSCOPY  2008   VAGINAL HYSTERECTOMY     WISDOM TOOTH EXTRACTION      FAMILY HISTORY :   Family History  Problem Relation Age of Onset   Nephrolithiasis Mother    Skin cancer Mother        from radiation tx   Hyperlipidemia Mother    Squamous cell carcinoma Mother    CVA Father 63   Fibroids Sister    Other Sister        breast cyst   Skin cancer Sister    Hernia Maternal Grandmother    Pancreatic cancer Maternal Grandfather    Alcohol abuse Paternal Grandmother    Depression Paternal Grandmother    Suicidality Paternal Aunt    Depression Paternal Aunt    Non-Hodgkin's lymphoma Maternal Great-grandmother    Irritable bowel syndrome Other    Anesthesia problems Neg Hx    Hypotension Neg Hx    Malignant hyperthermia Neg Hx    Pseudochol deficiency Neg Hx    Colon cancer Neg Hx    Colon polyps Neg Hx    Esophageal cancer Neg Hx    Stomach cancer Neg Hx    Rectal cancer Neg Hx     SOCIAL HISTORY:   Social History   Tobacco Use   Smoking status: Every Day    Current packs/day: 0.25    Average packs/day: 0.3 packs/day for 30.0 years (7.5 ttl pk-yrs)    Types: Cigarettes   Smokeless tobacco: Never  Vaping Use   Vaping status: Never Used  Substance Use Topics   Alcohol use: No   Drug use: No    ALLERGIES:  is allergic to ceftriaxone, nitrofurantoin monohyd macro, and sulfa antibiotics.  MEDICATIONS:  Current Outpatient Medications  Medication Sig Dispense Refill   acetaminophen (TYLENOL) 325 MG tablet Take 650 mg by mouth every 6 (six) hours as needed for moderate pain.     albuterol (VENTOLIN HFA) 108 (90 Base) MCG/ACT inhaler Inhale 1-2 puffs into the lungs every 6 (six) hours as needed for wheezing or shortness of breath. 6.7 g 0   ASPIRIN LOW DOSE 81 MG EC tablet TAKE ONE TABLET BY MOUTH DAILY 90 tablet 3   atorvastatin (LIPITOR) 10 MG tablet TAKE 1 TABLET BY MOUTH DAILY **NEEDS APPOINTMENT** 90 tablet 3   divalproex (DEPAKOTE ER) 250  MG 24 hr tablet Take 1 tablet (250 mg total) by mouth daily. 90 tablet 1   Multiple Vitamin (MULTIVITAMIN) capsule Take 1 capsule by mouth daily.     oxybutynin (DITROPAN-XL) 5 MG 24 hr tablet Take 5 mg by mouth at bedtime.     PARoxetine (PAXIL) 40 MG tablet Take 1.5 tablets (60 mg total) by mouth every morning. 135 tablet 1   Probiotic Product (UP4 PROBIOTICS ADULT PO) Take by mouth daily.     sodium fluoride (FLUORISHIELD) 1.1 % GEL dental gel Place 1 application  onto teeth every other day.     clindamycin (CLINDAGEL) 1 % gel Apply 1 application  topically daily as needed (For lips). (Patient not taking: Reported on 05/02/2023)     clonazePAM (KLONOPIN) 0.5 MG tablet Take 2 tablets (1 mg total) by mouth daily as needed for anxiety. (Patient not taking: Reported on 05/02/2023) 30 tablet 3   Cyanocobalamin (VITAMIN B-12) 1000 MCG SUBL Place 1 tablet (1,000 mcg total) under the tongue  daily. (Patient not taking: Reported on 05/02/2023) 90 tablet 3   ipratropium (ATROVENT) 0.06 % nasal spray Place 1 spray into both nostrils daily as needed for rhinitis. (Patient not taking: Reported on 05/02/2023)     promethazine (PHENERGAN) 25 MG tablet Take 1 tablet (25 mg total) by mouth every 6 (six) hours as needed for nausea or vomiting. (Patient not taking: Reported on 05/02/2023) 30 tablet 0   valACYclovir (VALTREX) 1000 MG tablet Take 1,000 mg by mouth daily as needed (for fever blisters). (Patient not taking: Reported on 05/02/2023)     vancomycin (VANCOCIN) 125 MG capsule Take 1 capsule (125 mg total) by mouth daily. (Patient not taking: Reported on 05/02/2023) 12 capsule 0   No current facility-administered medications for this visit.    PHYSICAL EXAMINATION:  BP 129/73 (BP Location: Left Arm, Patient Position: Sitting)   Pulse 71   Resp 18   Ht 5\' 8"  (1.727 m)   Wt 214 lb (97.1 kg)   SpO2 96%   BMI 32.54 kg/m   Filed Weights   05/02/23 1516  Weight: 214 lb (97.1 kg)     Physical Exam Vitals  and nursing note reviewed.  HENT:     Head: Normocephalic and atraumatic.     Mouth/Throat:     Pharynx: Oropharynx is clear.  Eyes:     Extraocular Movements: Extraocular movements intact.     Pupils: Pupils are equal, round, and reactive to light.  Cardiovascular:     Rate and Rhythm: Normal rate and regular rhythm.  Pulmonary:     Comments: Decreased breath sounds bilaterally.  Abdominal:     Palpations: Abdomen is soft.  Musculoskeletal:        General: Normal range of motion.     Cervical back: Normal range of motion.  Skin:    General: Skin is warm.  Neurological:     General: No focal deficit present.     Mental Status: She is alert and oriented to person, place, and time.  Psychiatric:        Behavior: Behavior normal.        Judgment: Judgment normal.        LABORATORY DATA:  I have reviewed the data as listed    Component Value Date/Time   NA 138 05/02/2023 1501   NA 141 06/28/2021 1604   K 3.2 (L) 05/02/2023 1501   CL 105 05/02/2023 1501   CO2 25 05/02/2023 1501   GLUCOSE 115 (H) 05/02/2023 1501   BUN 12 05/02/2023 1501   BUN 8 06/28/2021 1604   CREATININE 0.79 05/02/2023 1501   CREATININE 0.85 01/28/2022 1506   CALCIUM 8.6 (L) 05/02/2023 1501   PROT 6.9 01/10/2023 1614   PROT 6.4 06/28/2021 1604   ALBUMIN 4.2 01/10/2023 1614   ALBUMIN 4.2 06/28/2021 1604   AST 16 01/10/2023 1614   ALT 12 01/10/2023 1614   ALKPHOS 83 01/10/2023 1614   BILITOT 0.3 01/10/2023 1614   BILITOT 0.3 06/28/2021 1604   GFRNONAA >60 05/02/2023 1501   GFRAA >60 08/10/2017 1122    No results found for: "SPEP", "UPEP"  Lab Results  Component Value Date   WBC 11.0 (H) 05/02/2023   NEUTROABS 5.8 05/02/2023   HGB 15.5 (H) 05/02/2023   HCT 46.1 (H) 05/02/2023   MCV 88.5 05/02/2023   PLT 277 05/02/2023      Chemistry      Component Value Date/Time   NA 138 05/02/2023 1501   NA 141 06/28/2021 1604  K 3.2 (L) 05/02/2023 1501   CL 105 05/02/2023 1501   CO2 25  05/02/2023 1501   BUN 12 05/02/2023 1501   BUN 8 06/28/2021 1604   CREATININE 0.79 05/02/2023 1501   CREATININE 0.85 01/28/2022 1506   GLU 95 09/11/2020 0000      Component Value Date/Time   CALCIUM 8.6 (L) 05/02/2023 1501   ALKPHOS 83 01/10/2023 1614   AST 16 01/10/2023 1614   ALT 12 01/10/2023 1614   BILITOT 0.3 01/10/2023 1614   BILITOT 0.3 06/28/2021 1604       RADIOGRAPHIC STUDIES: I have personally reviewed the radiological images as listed and agreed with the findings in the report. No results found.   ASSESSMENT & PLAN:  Acquired neutrophilia # LEUCOCYTOSIS/neutrophilia/lymphocytosis monocytosis-chronic [2010 mild]; May 2023-White count 50,000 [pyelonephritis/C.diff]-more recently t 20,000. likley related to Lithium.   # JUNE 2023- peripheral blood flow cytometry- non-specific ; BCR ABL FISH-NEGATIVGE; JAK2 mutation CALR/MPL-NEGATIVE. Discussed regarding bone marrow biopsy- given continued leucocytosis However, hold bone marrow biopsy at this time.  # Hypokalemia: 2.8 today-difficulty with swallowing the pills.  Recommend compliance with liquid potassium.  Dicsussed re: ? Lithium.appt tomorrow with Dr.Cody.  Patient will need to follow-up with PCP for further follow-up in regards to potassium supplementation/potassium checks.  # BiPolar [Dr.Agarwal;[facility]] on lithium-recently dose adjusted/decreased.  mychart-  # DISPOSITION: # labs ordered today # follow up in 4 months- MD; cbc/cmp; [facility]- Dr.B  Cc; Dr.Cody; Agarwal    No orders of the defined types were placed in this encounter.  All questions were answered. The patient knows to call the clinic with any problems, questions or concerns.      Earna Coder, MD 05/02/2023 3:53 PM

## 2023-05-10 NOTE — Telephone Encounter (Signed)
Patient states she tried to return Jennifer [name]'s call.  Patient states her dentist is Dr. Lorenza Burton.  Patient states Dr. Clare Charon ended up pulling the tooth which had become infected.  Patient states she no longer needs a CT scan.  Patient states Dr. Dana Allan is aware and she and Dr. Clare Charon are on the same page.

## 2023-05-15 ENCOUNTER — Telehealth: Payer: Self-pay | Admitting: Family Medicine

## 2023-05-15 ENCOUNTER — Telehealth (HOSPITAL_BASED_OUTPATIENT_CLINIC_OR_DEPARTMENT_OTHER): Payer: 59 | Admitting: Student

## 2023-05-15 ENCOUNTER — Encounter (HOSPITAL_COMMUNITY): Payer: Self-pay | Admitting: Student

## 2023-05-15 DIAGNOSIS — F330 Major depressive disorder, recurrent, mild: Secondary | ICD-10-CM

## 2023-05-15 DIAGNOSIS — F4310 Post-traumatic stress disorder, unspecified: Secondary | ICD-10-CM

## 2023-05-15 DIAGNOSIS — F411 Generalized anxiety disorder: Secondary | ICD-10-CM | POA: Diagnosis not present
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
## 2023-05-15 NOTE — Telephone Encounter (Signed)
Lft pt vm to call ofc to sch CT. thanks 

## 2023-05-15 NOTE — Telephone Encounter (Signed)
Patient just called back. Can you call her back to schedule her CT. [phone]

## 2023-06-09 ENCOUNTER — Encounter: Payer: Self-pay | Admitting: Family Medicine

## 2023-06-09 ENCOUNTER — Other Ambulatory Visit: Payer: Self-pay

## 2023-06-09 DIAGNOSIS — I700 Atherosclerosis of aorta: Secondary | ICD-10-CM

## 2023-06-09 DIAGNOSIS — E785 Hyperlipidemia, unspecified: Secondary | ICD-10-CM

## 2023-06-09 MED ORDER — ATORVASTATIN CALCIUM 10 MG PO TABS
10.0000 mg | ORAL_TABLET | Freq: Every evening | ORAL | 3 refills | Status: DC
Start: 2023-06-09 — End: 2024-04-16

## 2023-06-09 NOTE — Telephone Encounter (Signed)
Sent to Dr. Clent Ridges for refill.

## 2023-06-12 ENCOUNTER — Telehealth (HOSPITAL_COMMUNITY): Payer: Self-pay

## 2023-06-12 NOTE — Telephone Encounter (Signed)
Patient is calling due to increased anxiety. Patient states that anxiety is really bad especially at work. Please review and advise, thank you

## 2023-06-14 ENCOUNTER — Telehealth (HOSPITAL_COMMUNITY): Payer: Self-pay | Admitting: Student

## 2023-06-14 ENCOUNTER — Encounter (HOSPITAL_COMMUNITY): Payer: Self-pay | Admitting: Student

## 2023-06-14 NOTE — Telephone Encounter (Signed)
Called patient regarding message left regarding increased anxiety, particularly at work. No answer, left VM. Will attempt to reach patient again today.   Lamar Sprinkles, MD PGY-3 06/14/2023  1:14 PM [facility]

## 2023-06-15 ENCOUNTER — Telehealth (HOSPITAL_COMMUNITY): Payer: 59 | Admitting: Student

## 2023-06-15 DIAGNOSIS — F411 Generalized anxiety disorder: Secondary | ICD-10-CM

## 2023-06-15 DIAGNOSIS — F4310 Post-traumatic stress disorder, unspecified: Secondary | ICD-10-CM

## 2023-06-15 DIAGNOSIS — F330 Major depressive disorder, recurrent, mild: Secondary | ICD-10-CM

## 2023-06-15 NOTE — Progress Notes (Signed)
BH MD Outpatient Progress Note   Televisit via video: I connected with patient on 06/15/23 at  4:00 PM EST by a video enabled telemedicine application and verified that I am speaking with the correct person using two identifiers.  Location: Patient: Home Provider: Office   I discussed the limitations of evaluation and management by telemedicine and the availability of in person appointments. The patient expressed understanding and agreed to proceed.  I discussed the assessment and treatment plan with the patient. The patient was provided an opportunity to ask questions and all were answered. The patient agreed with the plan and demonstrated an understanding of the instructions.   The patient was advised to call back or seek an in-person evaluation if the symptoms worsen or if the condition fails to improve as anticipated.  I spent 20 minutes in direct patient care.    06/15/2023 4:25 PM  RACHYL EDSALL  MRN:  [id number]  Assessment:  Erick Colace Rathel presents for follow-up evaluation via telehealth. Today, 06/15/2023 , patient reports an increase in anxiety since discontinuing her Klonopin.  She has found it increasingly difficult to manage her anxiety with her current regimen and wanted to have an emergency appointment today to discuss whether she should resume the Klonopin or if other options are available.  Due to her age and risk of cognitive decline, wanted to advise other options before resuming Klonopin.  Patient has never trialed propranolol.  She does have a rescue inhaler in her MAR but chart review shows that this was prescribed with acute bronchitis.  She does not have a history of asthma, COPD, nor a history of substance use including cocaine.  Propranolol would be safe to initiate at this time. She endorses passive SI but provides no safety concerns at this time.    Identifying Information: ARYKA TREECE is a 57 y.o. female with a history of MDD, PTSD,  who is an  established patient with [facility] for follow-up for management of medications and depressive symptoms.   Risk Assessment: An assessment of suicide and violence risk factors was performed as part of this evaluation and is not significantly changed from the last visit.             While future psychiatric events cannot be accurately predicted, the patient does not currently require acute inpatient psychiatric care and does not currently meet [facility] involuntary commitment criteria.          Plan:  # MDD #PTSD Past medication trials:  Status of problem: Stable Interventions: -- Continue Depakote ER 250 mg daily for mood augmentation - Continue Paxil 60 mg every morning for depressed mood and anxiety -- START propranolol 10 mg twice daily as needed for anxiety - Previously discontinued Klonopin 0.5 mg, as patient self-discontinued   Return to care in approximately 6 to 8 weeks  Patient was given contact information for behavioral health clinic and was instructed to call 911 for emergencies.    Patient and plan of care will be discussed with the Attending MD ,Dr. Josephina Shih, who agrees with the above statement and plan.   Subjective:  Chief Complaint:  Chief Complaint  Patient presents with   Anxiety   Stress    Interval History: She reports an increase in anxiety. She has had two more teeth pulled. She felt overwhelmed with the initial tooth removals. Her aunt left her jewelry, and her cousin had taken the case to court. Cousin called patient's parents, and left a  nasty message, which upset them. Feels most anxious on way to work and in afternoon.   She has been unable to relax. She feels as though something bad will happen to her parents, herself, her children, or sisters. She has been more irritable, got into a verbal disagreement at work. Her dogs have been making her more irritable. Occurring past 2.5 weeks. Knew that she had to have her other teeth  pulled 1.5 weeks ago. She will have all her teeth pulled and get dentures. She is in a lot of pain.   No issues when she first discontinued Clonazepam. She had a panic attack when she first visited her son's new home. She cried for two days.   Denies active SI, but feels sorry for herself. She is isolating herself, although she has to work. She does endorse crying spells; did not picture things going this way for her. Not sleeping well.   Panic attacks, hyperventilating, crying, tachycardic, nauseous with some emesis.   Planned to pull from husband's social security at 25, but she was told she makes too much money.   Will reach out to [facility] to arrange therapy.   Visit Diagnosis:    ICD-10-CM   1. GAD (generalized anxiety disorder)  F41.1     2. PTSD (post-traumatic stress disorder)  F43.10     3. MDD (major depressive disorder), recurrent episode, mild (HCC)  F33.0         Past Psychiatric History:  Diagnoses: MDD, PTSD,  Medication trials: Lithium,  Previous psychiatrist/therapist: Dr. Michae Kava. Hospitalizations: Denies Suicide attempts: at 45, OD on aspirin SIB: Cutting at 77.  Hx of violence towards others: Denies Current access to guns: Denies Hx of trauma/abuse: Yes Substance use: Cigarettes 0.5 ppd, Denies alcohol, Denies illicit drug use.   Past Medical History:  Past Medical History:  Diagnosis Date   Anxiety    Cancer (HCC)    Depression    Diverticulosis    Fundic gland polyps of stomach, benign    History of kidney stones    Low HDL (under 40)    Personal history of adenomatous colonic polyps 05/08/2012   Rosacea    Tenosynovitis, de Quervain    Urosepsis 10/2011   after stent    Past Surgical History:  Procedure Laterality Date   CERVICAL BIOPSY  W/ LOOP ELECTRODE EXCISION  04/2003   COLONOSCOPY     COLONOSCOPY WITH PROPOFOL N/A 12/29/2022   Procedure: COLONOSCOPY WITH PROPOFOL;  Surgeon: [name], MD;  Location: [facility] ENDOSCOPY;  Service:  Gastroenterology;  Laterality: N/A;   CYSTOSCOPY W/ URETERAL STENT PLACEMENT  10/14/2011   Procedure: CYSTOSCOPY WITH RETROGRADE PYELOGRAM/URETERAL STENT PLACEMENT;  Surgeon: Lindaann Slough, MD;  Location: [facility] ORS;  Service: Urology;  Laterality: Right;  Right Double J Stent   CYSTOSCOPY W/ URETERAL STENT PLACEMENT Left 12/26/2021   Procedure: CYSTOSCOPY WITH RETROGRADE PYELOGRAM/URETERAL STENT PLACEMENT;  Surgeon: Crist Fat, MD;  Location: [facility] OR;  Service: Urology;  Laterality: Left;   CYSTOSCOPY/URETEROSCOPY/HOLMIUM LASER/STENT PLACEMENT Left 01/19/2022   Procedure: LEFT URETEROSCOPY/STENT EXCHANGE/BASKET STONE REMOVAL;  Surgeon: Crist Fat, MD;  Location: [facility] ORS;  Service: Urology;  Laterality: Left;   MULTIPLE TOOTH EXTRACTIONS     OOPHORECTOMY     left ovary   POLYPECTOMY     TUBAL LIGATION     UMBILICAL HERNIA REPAIR     UPPER GASTROINTESTINAL ENDOSCOPY  2008   VAGINAL HYSTERECTOMY     WISDOM TOOTH EXTRACTION      Family  Psychiatric History:   Family History:  Family History  Problem Relation Age of Onset   Nephrolithiasis Mother    Skin cancer Mother        from radiation tx   Hyperlipidemia Mother    Squamous cell carcinoma Mother    CVA Father 29   Fibroids Sister    Other Sister        breast cyst   Skin cancer Sister    Hernia Maternal Grandmother    Pancreatic cancer Maternal Grandfather    Alcohol abuse Paternal Grandmother    Depression Paternal Grandmother    Suicidality Paternal Aunt    Depression Paternal Aunt    Non-Hodgkin's lymphoma Maternal Great-grandmother    Irritable bowel syndrome Other    Anesthesia problems Neg Hx    Hypotension Neg Hx    Malignant hyperthermia Neg Hx    Pseudochol deficiency Neg Hx    Colon cancer Neg Hx    Colon polyps Neg Hx    Esophageal cancer Neg Hx    Stomach cancer Neg Hx    Rectal cancer Neg Hx     Social History:  Academic/Vocational:  Social History   Socioeconomic History   Marital status:  Widowed    Spouse name: Not on file   Number of children: 2   Years of education: 58   Highest education level: Not on file  Occupational History   Occupation: Sports coach: [employer]  Tobacco Use   Smoking status: Every Day    Current packs/day: 0.25    Average packs/day: 0.3 packs/day for 30.0 years (7.5 ttl pk-yrs)    Types: Cigarettes   Smokeless tobacco: Never  Vaping Use   Vaping status: Never Used  Substance and Sexual Activity   Alcohol use: No   Drug use: No   Sexual activity: Yes    Birth control/protection: Surgical  Other Topics Concern   Not on file  Social History Narrative   09/12/19   From: [place] > [place] > [place] as a teenager   Living: Pt lives in [place] with her 2.dogs. Husband died in 02-28-17she was married for 23 yrs.   Has recently started dating   Work: works at [employer] as a Nurse, learning disability      Family:  Marchelle Folks and Molli Hazard (2 adult children), and 5 grandchildren (1 has passed)       Enjoys: spending time with partner, going out to eat, yardwork and house work       Exercise: not currently - caring for 2 acres, and yard/house work   Diet: graze through the day      Safety   Seat belts: Yes    Guns: No   Safe in relationships: Yes    ------------------   Active smoker; no alcohol; used to work in [employer]; currently unemployed; by self with 2 dogs.             Social Determinants of Health   Financial Resource Strain: Not on file  Food Insecurity: Not on file  Transportation Needs: No Transportation Needs (02/02/2022)   PRAPARE - Administrator, Civil Service (Medical): No    Lack of Transportation (Non-Medical): No  Physical Activity: Not on file  Stress: Not on file  Social Connections: Not on file    Allergies:  Allergies  Allergen Reactions   Ceftriaxone Rash    Significant full body rash,  Tolerated cefepime 5/22   Nitrofurantoin Monohyd Macro  Nausea And Vomiting    Body aches    Sulfa Antibiotics Nausea Only and Rash    Fever and disorientation    Current Medications: Current Outpatient Medications  Medication Sig Dispense Refill   divalproex (DEPAKOTE ER) 250 MG 24 hr tablet Take 1 tablet (250 mg total) by mouth daily. 90 tablet 1   PARoxetine (PAXIL) 40 MG tablet Take 1.5 tablets (60 mg total) by mouth every morning. 135 tablet 1   propranolol (INDERAL) 10 MG tablet Take 1 tablet (10 mg total) by mouth 2 (two) times daily as needed (Anxiety). 60 tablet 1   acetaminophen (TYLENOL) 325 MG tablet Take 650 mg by mouth every 6 (six) hours as needed for moderate pain.     albuterol (VENTOLIN HFA) 108 (90 Base) MCG/ACT inhaler Inhale 1-2 puffs into the lungs every 6 (six) hours as needed for wheezing or shortness of breath. 6.7 g 0   ASPIRIN LOW DOSE 81 MG EC tablet TAKE ONE TABLET BY MOUTH DAILY 90 tablet 3   atorvastatin (LIPITOR) 10 MG tablet Take 1 tablet (10 mg total) by mouth every evening. 90 tablet 3   clindamycin (CLINDAGEL) 1 % gel Apply 1 application  topically daily as needed (For lips).     Cyanocobalamin (VITAMIN B-12) 1000 MCG SUBL Place 1 tablet (1,000 mcg total) under the tongue daily. (Patient not taking: Reported on 05/02/2023) 90 tablet 3   ipratropium (ATROVENT) 0.06 % nasal spray Place 1 spray into both nostrils daily as needed for rhinitis.     Multiple Vitamin (MULTIVITAMIN) capsule Take 1 capsule by mouth daily.     oxybutynin (DITROPAN-XL) 5 MG 24 hr tablet Take 5 mg by mouth at bedtime.     Probiotic Product (UP4 PROBIOTICS ADULT PO) Take by mouth daily.     promethazine (PHENERGAN) 25 MG tablet Take 1 tablet (25 mg total) by mouth every 6 (six) hours as needed for nausea or vomiting. (Patient not taking: Reported on 05/02/2023) 30 tablet 0   sodium fluoride (FLUORISHIELD) 1.1 % GEL dental gel Place 1 application  onto teeth every other day.     valACYclovir (VALTREX) 1000 MG tablet Take 1,000 mg by mouth daily as needed (for fever blisters).      vancomycin (VANCOCIN) 125 MG capsule Take 1 capsule (125 mg total) by mouth daily. (Patient not taking: Reported on 05/02/2023) 12 capsule 0   No current facility-administered medications for this visit.    ROS: Review of Systems  Constitutional:  Negative for appetite change and unexpected weight change.  Gastrointestinal:  Positive for abdominal pain and nausea.       Chronic  Neurological:  Negative for dizziness, weakness and headaches.     Objective:  Psychiatric Specialty Exam: There were no vitals taken for this visit.There is no height or weight on file to calculate BMI.  General Appearance: Casual  Eye Contact:  Good  Speech:  Clear and Coherent and Normal Rate  Volume:  Normal  Mood:  Anxious and Euthymic  Affect:  Appropriate and Congruent  Thought Content: WDL and Logical   Suicidal Thoughts:  Yes.  without intent/plan  Homicidal Thoughts:  No  Thought Process:  Coherent and Linear  Orientation:  Full (Time, Place, and Person)    Memory: Immediate;   Good Recent;   Good Remote;   Good  Judgment:  Good  Insight:  Good  Concentration:  Concentration: Good and Attention Span: Good  Recall: not formally assessed   Fund of Knowledge:  Good  Language: Good  Psychomotor Activity:  Normal  Akathisia:  No  AIMS (if indicated): not done  Assets:  Communication Skills Desire for Improvement Housing Intimacy Leisure Time Resilience Social Support Transportation  ADL's:  Intact  Cognition: WNL  Sleep:  Good   PE: General: well-appearing; no acute distress  Pulm: no increased work of breathing on room air  Strength & Muscle Tone: within normal limits Neuro: no focal neurological deficits observed  Gait & Station: normal  Metabolic Disorder Labs: Lab Results  Component Value Date   HGBA1C 6.0 01/10/2023   MPG 102.54 12/27/2021   No results found for: "PROLACTIN" Lab Results  Component Value Date   CHOL 139 01/10/2023   TRIG 130.0 01/10/2023   HDL  37.70 (L) 01/10/2023   CHOLHDL 4 01/10/2023   VLDL 26.0 01/10/2023   LDLCALC 75 01/10/2023   LDLCALC 99 09/11/2020   Lab Results  Component Value Date   TSH 1.35 01/28/2022   TSH 1.430 06/28/2021    Therapeutic Level Labs: Lab Results  Component Value Date   LITHIUM 1.0 02/09/2022   LITHIUM 0.5 06/28/2021   No results found for: "VALPROATE" No results found for: "CBMZ"  Screenings: GAD-7    Flowsheet Row Office Visit from [facility]/05/2023 in [facility] Visit from 03/14/2023 in [facility] Visit from 01/10/2023 in [facility] Visit from 02/24/2022 in [facility] [place]  Total GAD-7 Score 7 6 5 10       PHQ2-9    Flowsheet Row Office Visit from 04/05/2023 in [facility] Visit from 03/14/2023 in [facility] Video Visit from 01[facility]19/2023 in [facility] Office Visit from 01[facility]10/2023 in [facility] Video Visit from 12/01/2022 in [facility]  PHQ-2 Total Score 2 2 1 1 3   PHQ-9 Total Score 7 5 3 2 5       Flowsheet Row Video Visit from 01/19/2023 in [facility] Admission (Discharged) from 12/29/2022 in [facility] Video Visit from 12/01/2022 in [facility]  C-SSRS RISK CATEGORY Error: Q3, 4, or 5 should not be populated when Q2 is No No Risk Error: Q3, 4, or 5 should not be populated when Q2 is No       Collaboration of Care: Collaboration of Care: Dr. Josephina Shih  Patient/Guardian was advised Release of Information must be obtained prior to any record release in order to collaborate their care with an outside provider.  Patient/Guardian was advised if they have not already done so to contact the registration department to sign all necessary forms in order for [place] to release information regarding their care.   Consent: Patient/Guardian gives verbal consent for treatment and assignment of benefits for services provided during this visit. Patient/Guardian expressed understanding and agreed to proceed.   Lamar Sprinkles, MD 06/15/2023 4:25 PM

## 2023-06-16 ENCOUNTER — Telehealth (HOSPITAL_COMMUNITY): Payer: Self-pay

## 2023-06-16 NOTE — Telephone Encounter (Signed)
Patient called and said she spoke with you by phone yesterday and you were going to send something in for her anxiety but the pharmacy did not get anything. I reviewed your note and did not see a new medication. Please review and advise, thank you.

## 2023-06-19 ENCOUNTER — Encounter (HOSPITAL_COMMUNITY): Payer: Self-pay | Admitting: Student

## 2023-06-19 ENCOUNTER — Other Ambulatory Visit (HOSPITAL_COMMUNITY): Payer: Self-pay | Admitting: Student

## 2023-06-19 MED ORDER — PROPRANOLOL HCL 10 MG PO TABS: 10.0000 mg | ORAL_TABLET | Freq: Two times a day (BID) | ORAL | 1 refills | Status: DC | PRN
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
## 2023-08-10 NOTE — Telephone Encounter (Signed)
 Error

## 2023-08-16 ENCOUNTER — Telehealth (HOSPITAL_COMMUNITY): Payer: Self-pay | Admitting: *Deleted

## 2023-08-16 ENCOUNTER — Other Ambulatory Visit (HOSPITAL_COMMUNITY): Payer: Self-pay | Admitting: Student

## 2023-08-16 MED ORDER — PROPRANOLOL HCL 10 MG PO TABS: 10.0000 mg | ORAL_TABLET | Freq: Two times a day (BID) | ORAL | 1 refills | Status: DC | PRN

## 2023-08-16 NOTE — Telephone Encounter (Signed)
 Done

## 2023-08-16 NOTE — Telephone Encounter (Signed)
 Fax received for refill of Propranolol 10mg . Next appointment 1/13.

## 2023-08-21 ENCOUNTER — Telehealth (HOSPITAL_COMMUNITY): Payer: 59 | Admitting: Student

## 2023-08-21 ENCOUNTER — Encounter (HOSPITAL_COMMUNITY): Payer: Self-pay | Admitting: Student

## 2023-08-21 DIAGNOSIS — F411 Generalized anxiety disorder: Secondary | ICD-10-CM

## 2023-08-21 DIAGNOSIS — F4310 Post-traumatic stress disorder, unspecified: Secondary | ICD-10-CM | POA: Diagnosis not present

## 2023-08-21 DIAGNOSIS — F331 Major depressive disorder, recurrent, moderate: Secondary | ICD-10-CM | POA: Diagnosis not present

## 2023-08-21 HISTORY — DX: Major depressive disorder, recurrent, moderate: F33.1

## 2023-08-21 MED ORDER — DIVALPROEX SODIUM ER 250 MG PO TB24
250.0000 mg | ORAL_TABLET | Freq: Every day | ORAL | 0 refills | Status: DC
Start: 2023-08-21 — End: 2023-11-28

## 2023-08-21 MED ORDER — PROPRANOLOL HCL 10 MG PO TABS: 10.0000 mg | ORAL_TABLET | Freq: Two times a day (BID) | ORAL | 0 refills | Status: DC | PRN

## 2023-08-21 MED ORDER — PAROXETINE HCL 40 MG PO TABS
60.0000 mg | ORAL_TABLET | ORAL | 0 refills | Status: DC
Start: 2023-08-21 — End: 2023-12-13

## 2023-08-21 NOTE — Progress Notes (Signed)
 [facility] MD Outpatient Progress Note   Televisit via video: I connected with patient on 08/21/2023  at  3:30 PM EST by a video enabled telemedicine application and verified that I am speaking with the correct person using two identifiers.  Location: Patient: Home Provider: Office   I discussed the limitations of evaluation and management by telemedicine and the availability of in person appointments. The patient expressed understanding and agreed to proceed.  I discussed the assessment and treatment plan with the patient. The patient was provided an opportunity to ask questions and all were answered. The patient agreed with the plan and demonstrated an understanding of the instructions.   The patient was advised to call back or seek an in-person evaluation if the symptoms worsen or if the condition fails to improve as anticipated.  I spent 21 minutes in direct patient care.    08/21/2023 4:03 PM  Jennifer [name]  MRN:  [id number]  Assessment:  Jennifer [name] presents for follow-up evaluation via telehealth. Today, 08/21/2023  , patient reports an improvement to her anxiety since starting propranolol .  She is no longer so irritable or quick to anger, and is able to relax and slow down racing thoughts.  She again confirmed that she does not have a history of asthma, COPD, nor a history of substance use including cocaine.  Patient poses no safety concerns toward herself nor others at this time.  She is medication compliant and does not believe changes to be needed today.  We will continue medications as prescribed.    Identifying Information: Jennifer [name] is a 58 y.o. female with a history of MDD, PTSD,  who is an established patient with [facility] for follow-up for management of medications and depressive symptoms.   Risk Assessment: An assessment of suicide and violence risk factors was performed as part of this evaluation and is not significantly changed from  the last visit.             While future psychiatric events cannot be accurately predicted, the patient does not currently require acute inpatient psychiatric care and does not currently meet [place]  involuntary commitment criteria.          Plan:  # MDD #PTSD #GAD Past medication trials:  Status of problem: Stable Interventions: -- Continue Depakote  ER 250 mg daily for mood augmentation - Continue Paxil  60 mg every morning for depressed mood and anxiety -- Continue propranolol  10 mg twice daily as needed for anxiety   Return to care in approximately 6 to 8 weeks  Patient was given contact information for behavioral health clinic and was instructed to call 911 for emergencies.    Patient and plan of care will be discussed with the Attending MD ,Dr. Carvin, who agrees with the above statement and plan.   Subjective:  Chief Complaint:  Chief Complaint  Patient presents with   Follow-up   Medication Refill    Interval History: Patient reports responding well to the Propranolol . It has helped a great deal with her anxiety. She does not feel the need to adjust. BP at urologist's 169/75. Also in a lot of pain at the time. HR WNL.  Stressors resolved: Dentistry- procedure to be completed next Thursday. Cousin- has not heard from her.   She has been able to relax, and her thoughts are no longer racing. She is able to watch TV and walking on the treadmill (bought by her daughter for Christmas). She has been much less  irritable, no longer picking arguments. She has been physically ill and out of work x 1 week. She was nauseous and tired; believes it to be a stomach bug.   She is no longer isolating, back at work. Denies SI, HI, AVH.  Will reach out to [facility] to arrange therapy; she has not yet done it.  Rescue inhaler only used for allergies. Again confirms no hx of ashthma or COPD.   Visit Diagnosis:    ICD-10-CM   1. MDD (major depressive disorder), recurrent episode,  moderate (HCC)  F33.1     2. GAD (generalized anxiety disorder)  F41.1     3. PTSD (post-traumatic stress disorder)  F43.10          Past Psychiatric History:  Diagnoses: MDD, PTSD,  Medication trials: Lithium ,  Previous psychiatrist/therapist: Dr. Brutus. Hospitalizations: Denies Suicide attempts: at 55, OD on aspirin  SIB: Cutting at 23.  Hx of violence towards others: Denies Current access to guns: Denies Hx of trauma/abuse: Yes Substance use: Cigarettes 0.5 ppd, Denies alcohol, Denies illicit drug use.   Past Medical History:  Past Medical History:  Diagnosis Date   Anxiety    Cancer (HCC)    Depression    Depression with anxiety 10/15/2014   Diverticulosis    Fundic gland polyps of stomach, benign    History of kidney stones    Insomnia 02/18/2016   Low HDL (under 40)    MDD (major depressive disorder), recurrent episode, mild (HCC) 02/18/2016   Mild protein-calorie malnutrition (HCC) 01/06/2022   Mood disorder (HCC) 02/21/2007   Qualifier: Diagnosis of   By: Cecilie CMA, NANNIE Ivy         Personal history of adenomatous colonic polyps 05/08/2012   Rosacea    Tenosynovitis, de Quervain    Urosepsis 10/2011   after stent    Past Surgical History:  Procedure Laterality Date   CERVICAL BIOPSY  W/ LOOP ELECTRODE EXCISION  04/2003   COLONOSCOPY     COLONOSCOPY WITH PROPOFOL  N/A 12/29/2022   Procedure: COLONOSCOPY WITH PROPOFOL ;  Surgeon: Therisa Bi, MD;  Location[facility] ENDOSCOPY;  Service: Gastroenterology;  Laterality: N/A;   CYSTOSCOPY W/ URETERAL STENT PLACEMENT  10/14/2011   Procedure: CYSTOSCOPY WITH RETROGRADE PYELOGRAM/URETERAL STENT PLACEMENT;  Surgeon: Thomasine Oiler, MD;  Location: [facility] ORS;  Service: Urology;  Laterality: Right;  Right Double J Stent   CYSTOSCOPY W/ URETERAL STENT PLACEMENT Left 12/26/2021   Procedure: CYSTOSCOPY WITH RETROGRADE PYELOGRAM/URETERAL STENT PLACEMENT;  Surgeon: Cam Morene ORN, MD;  Location: [facility] OR;  Service: Urology;   Laterality: Left;   CYSTOSCOPY/URETEROSCOPY/HOLMIUM LASER/STENT PLACEMENT Left 01/19/2022   Procedure: LEFT URETEROSCOPY/STENT EXCHANGE/BASKET STONE REMOVAL;  Surgeon: Cam Morene ORN, MD;  Location: [facility] ORS;  Service: Urology;  Laterality: Left;   MULTIPLE TOOTH EXTRACTIONS     OOPHORECTOMY     left ovary   POLYPECTOMY     TUBAL LIGATION     UMBILICAL HERNIA REPAIR     UPPER GASTROINTESTINAL ENDOSCOPY  2008   VAGINAL HYSTERECTOMY     WISDOM TOOTH EXTRACTION      Family Psychiatric History:   Family History:  Family History  Problem Relation Age of Onset   Nephrolithiasis Mother    Skin cancer Mother        from radiation tx   Hyperlipidemia Mother    Squamous cell carcinoma Mother    CVA Father 26   Fibroids Sister    Other Sister        breast cyst  Skin cancer Sister    Hernia Maternal Grandmother    Pancreatic cancer Maternal Grandfather    Alcohol abuse Paternal Grandmother    Depression Paternal Grandmother    Suicidality Paternal Aunt    Depression Paternal Aunt    Non-Hodgkin's lymphoma Maternal Great-grandmother    Irritable bowel syndrome Other    Anesthesia problems Neg Hx    Hypotension Neg Hx    Malignant hyperthermia Neg Hx    Pseudochol deficiency Neg Hx    Colon cancer Neg Hx    Colon polyps Neg Hx    Esophageal cancer Neg Hx    Stomach cancer Neg Hx    Rectal cancer Neg Hx     Social History:  Academic/Vocational:  Social History   Socioeconomic History   Marital status: Widowed    Spouse name: Not on file   Number of children: 2   Years of education: 74   Highest education level: Not on file  Occupational History   Occupation: Sports Coach: [employer]  Tobacco Use   Smoking status: Every Day    Current packs/day: 0.25    Average packs/day: 0.3 packs/day for 30.0 years (7.5 ttl pk-yrs)    Types: Cigarettes   Smokeless tobacco: Never  Vaping Use   Vaping status: Never Used  Substance and Sexual Activity   Alcohol use:  No   Drug use: No   Sexual activity: Yes    Birth control/protection: Surgical  Other Topics Concern   Not on file  Social History Narrative   09/12/19   From: [place] > [place] > [place] a teenager   Living: Pt lives in [place] with her 2.dogs. Husband died in February 15, 2017she was married for 23 yrs.   Has recently started dating   Work: works at [employer] as a nurse, learning disability      Family:  Alan and Donnice (2 adult children), and 5 grandchildren (1 has passed)       Enjoys: spending time with partner, going out to eat, yardwork and house work       Exercise: not currently - caring for 2 acres, and yard/house work   Diet: graze through the day      Safety   Seat belts: Yes    Guns: No   Safe in relationships: Yes    ------------------   Active smoker; no alcohol; used to work in [employer]; currently unemployed; by self with 2 dogs.             Social Drivers of Corporate Investment Banker Strain: Not on file  Food Insecurity: Not on file  Transportation Needs: No Transportation Needs (02/02/2022)   PRAPARE - Administrator, Civil Service (Medical): No    Lack of Transportation (Non-Medical): No  Physical Activity: Not on file  Stress: Not on file  Social Connections: Not on file    Allergies:  Allergies  Allergen Reactions   Ceftriaxone  Rash    Significant full body rash,  Tolerated cefepime  5/22   Nitrofurantoin Monohyd Macro Nausea And Vomiting    Body aches   Sulfa Antibiotics Nausea Only and Rash    Fever and disorientation    Current Medications: Current Outpatient Medications  Medication Sig Dispense Refill   ASPIRIN  LOW DOSE 81 MG EC tablet TAKE ONE TABLET BY MOUTH DAILY 90 tablet 3   atorvastatin  (LIPITOR) 10 MG tablet Take 1 tablet (10 mg total) by mouth every evening. 90 tablet  3   divalproex  (DEPAKOTE  ER) 250 MG 24 hr tablet Take 1 tablet (250 mg total) by mouth daily. 90 tablet 1   oxybutynin  (DITROPAN -XL) 5 MG 24 hr  tablet Take 5 mg by mouth at bedtime.     PARoxetine  (PAXIL ) 40 MG tablet Take 1.5 tablets (60 mg total) by mouth every morning. 135 tablet 1   propranolol  (INDERAL ) 10 MG tablet Take 1 tablet (10 mg total) by mouth 2 (two) times daily as needed (Anxiety). 60 tablet 1   acetaminophen  (TYLENOL ) 325 MG tablet Take 650 mg by mouth every 6 (six) hours as needed for moderate pain.     albuterol  (VENTOLIN  HFA) 108 (90 Base) MCG/ACT inhaler Inhale 1-2 puffs into the lungs every 6 (six) hours as needed for wheezing or shortness of breath. 6.7 g 0   clindamycin (CLINDAGEL) 1 % gel Apply 1 application  topically daily as needed (For lips).     Cyanocobalamin  (VITAMIN B-12) 1000 MCG SUBL Place 1 tablet (1,000 mcg total) under the tongue daily. (Patient not taking: Reported on 05/02/2023) 90 tablet 3   ipratropium (ATROVENT) 0.06 % nasal spray Place 1 spray into both nostrils daily as needed for rhinitis.     Multiple Vitamin (MULTIVITAMIN) capsule Take 1 capsule by mouth daily.     Probiotic Product (UP4 PROBIOTICS ADULT PO) Take by mouth daily.     promethazine  (PHENERGAN ) 25 MG tablet Take 1 tablet (25 mg total) by mouth every 6 (six) hours as needed for nausea or vomiting. (Patient not taking: Reported on 05/02/2023) 30 tablet 0   sodium fluoride (FLUORISHIELD) 1.1 % GEL dental gel Place 1 application  onto teeth every other day.     valACYclovir (VALTREX) 1000 MG tablet Take 1,000 mg by mouth daily as needed (for fever blisters).     vancomycin  (VANCOCIN ) 125 MG capsule Take 1 capsule (125 mg total) by mouth daily. (Patient not taking: Reported on 05/02/2023) 12 capsule 0   No current facility-administered medications for this visit.    ROS: Review of Systems  Constitutional:  Negative for appetite change and unexpected weight change.  Gastrointestinal:  Positive for abdominal pain and nausea.       Chronic  Neurological:  Negative for dizziness, weakness and headaches.     Objective:  Psychiatric  Specialty Exam: There were no vitals taken for this visit.There is no height or weight on file to calculate BMI.  General Appearance: Casual  Eye Contact:  Good  Speech:  Clear and Coherent and Normal Rate  Volume:  Normal  Mood:  Euthymic, much less anxious  Affect:  Appropriate and Congruent  Thought Content: WDL and Logical   Suicidal Thoughts:  No  Homicidal Thoughts:  No  Thought Process:  Coherent and Linear  Orientation:  Full (Time, Place, and Person)    Memory: Immediate;   Good Recent;   Good Remote;   Good  Judgment:  Good  Insight:  Good  Concentration:  Concentration: Good and Attention Span: Good  Recall: not formally assessed   Fund of Knowledge: Good  Language: Good  Psychomotor Activity:  Normal  Akathisia:  No  AIMS (if indicated): not done  Assets:  Communication Skills Desire for Improvement Housing Intimacy Leisure Time Resilience Social Support Transportation  ADL's:  Intact  Cognition: WNL  Sleep:  Good   PE: General: well-appearing; no acute distress  Pulm: no increased work of breathing on room air  Strength & Muscle Tone: within normal limits Neuro: no  focal neurological deficits observed  Gait & Station: normal  Metabolic Disorder Labs: Lab Results  Component Value Date   HGBA1C 6.0 01/10/2023   MPG 102.54 12/27/2021   No results found for: PROLACTIN Lab Results  Component Value Date   CHOL 139 01/10/2023   TRIG 130.0 01/10/2023   HDL 37.70 (L) 01/10/2023   CHOLHDL 4 01/10/2023   VLDL 26.0 01/10/2023   LDLCALC 75 01/10/2023   LDLCALC 99 09/11/2020   Lab Results  Component Value Date   TSH 1.35 01/28/2022   TSH 1.430 06/28/2021    Therapeutic Level Labs: Lab Results  Component Value Date   LITHIUM  1.0 02/09/2022   LITHIUM  0.5 06/28/2021   No results found for: VALPROATE No results found for: CBMZ  Screenings: GAD-7    Flowsheet Row Office Visit from 04/05/2023 in [facility] Visit from 03/14/2023 in [facility] Visit from 01/10/2023 in [facility] [facility] [employer] Visit from 02/24/2022 in [facility]  Total GAD-7 Score 7 6 5 10       PHQ2-9    Flowsheet Row Office Visit from 04/05/2023 in [facility] Visit from 03[facility]14/2023 in [facility] [facility] [employer] Video Visit from 01/19/2023 in [facility] Office Visit from 01[facility]10/2023 in [facility] [place] [facility] Video Visit from 12/01/2022 in [facility]  PHQ-2 Total Score 2 2 1 1 3   PHQ-9 Total Score 7 5 3 2 5       Flowsheet Row Video Visit from 01/19/2023 in [facility] Admission (Discharged) from 12/29/2022 in [facility] Video Visit from 12/01/2022 in [facility]  C-SSRS RISK CATEGORY Error: Q3, 4, or 5 should not be populated when Q2 is No No Risk Error: Q3, 4, or 5 should not be populated when Q2 is No       Collaboration of Care: Collaboration of Care: Dr. Carvin  Patient/Guardian was advised Release of Information must be obtained prior to any record release in order to collaborate their care with an outside provider. Patient/Guardian was advised if they have not already done so to contact the registration department to sign all necessary forms in order for us  to release information regarding their care.   Consent: Patient/Guardian gives verbal consent for treatment and assignment of benefits for services provided during this visit. Patient/Guardian expressed understanding and agreed to proceed.   Charmaine Myrtle, MD 08/21/2023 4:03 PM

## 2023-08-23 NOTE — Addendum Note (Signed)
 Addended by: CARVIN CROCK on: 08/23/2023 01:27 PM   Modules accepted: Level of Service

## 2023-10-09 ENCOUNTER — Telehealth (HOSPITAL_COMMUNITY): Payer: 59 | Admitting: Student

## 2023-10-09 DIAGNOSIS — F330 Major depressive disorder, recurrent, mild: Secondary | ICD-10-CM

## 2023-10-09 DIAGNOSIS — F411 Generalized anxiety disorder: Secondary | ICD-10-CM | POA: Diagnosis not present

## 2023-10-09 DIAGNOSIS — F4310 Post-traumatic stress disorder, unspecified: Secondary | ICD-10-CM
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
## 2023-10-12 ENCOUNTER — Encounter (HOSPITAL_COMMUNITY): Payer: Self-pay | Admitting: Student

## 2023-10-13 NOTE — Addendum Note (Signed)
 Addended by: Everlena Cooper on: 10/13/2023 12:22 PM   Modules accepted: Level of Service

## 2023-11-28 ENCOUNTER — Telehealth (HOSPITAL_COMMUNITY): Payer: Self-pay | Admitting: *Deleted

## 2023-11-28 ENCOUNTER — Other Ambulatory Visit (HOSPITAL_COMMUNITY): Payer: Self-pay | Admitting: Student

## 2023-11-28 DIAGNOSIS — F331 Major depressive disorder, recurrent, moderate: Secondary | ICD-10-CM

## 2023-11-28 MED ORDER — DIVALPROEX SODIUM ER 250 MG PO TB24
250.0000 mg | ORAL_TABLET | Freq: Every day | ORAL | 0 refills | Status: DC
Start: 2023-11-28 — End: 2024-02-21

## 2023-11-28 NOTE — Telephone Encounter (Signed)
 Pt will need a bridge of the Divalproex  SOD 250 mg tab until next scheduled appointment on 12/13/23.

## 2023-11-28 NOTE — Telephone Encounter (Signed)
 This is done.

## 2023-12-06 ENCOUNTER — Telehealth: Payer: Self-pay | Admitting: Family Medicine

## 2023-12-06 NOTE — Telephone Encounter (Signed)
 Dr Sueanne Emerald is leaving the practice in June. If you wish to continue care at this office please call and schedule a transfer of care appointment with one of the following providers: Dr Casimir Cleaver, MD, Tino Foreman or Tona Francis, NP.  E2C2 please schedule TOC

## 2023-12-13 ENCOUNTER — Telehealth (HOSPITAL_COMMUNITY): Admitting: Student

## 2023-12-13 ENCOUNTER — Telehealth (HOSPITAL_BASED_OUTPATIENT_CLINIC_OR_DEPARTMENT_OTHER): Admitting: Student

## 2023-12-13 DIAGNOSIS — F411 Generalized anxiety disorder: Secondary | ICD-10-CM

## 2023-12-13 DIAGNOSIS — F331 Major depressive disorder, recurrent, moderate: Secondary | ICD-10-CM

## 2023-12-13 DIAGNOSIS — F4310 Post-traumatic stress disorder, unspecified: Secondary | ICD-10-CM | POA: Diagnosis not present

## 2023-12-13 DIAGNOSIS — F330 Major depressive disorder, recurrent, mild: Secondary | ICD-10-CM | POA: Diagnosis not present

## 2023-12-13 MED ORDER — PROPRANOLOL HCL 10 MG PO TABS: 10.0000 mg | ORAL_TABLET | Freq: Two times a day (BID) | ORAL | 0 refills | Status: DC | PRN

## 2023-12-13 MED ORDER — PAROXETINE HCL 40 MG PO TABS
60.0000 mg | ORAL_TABLET | ORAL | 0 refills | Status: DC
Start: 2023-12-13 — End: 2024-02-21
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
## 2023-12-15 ENCOUNTER — Encounter (HOSPITAL_COMMUNITY): Payer: Self-pay | Admitting: Student

## 2023-12-15 NOTE — Addendum Note (Signed)
 Addended by: Donnelly Gainer on: 12/15/2023 12:28 PM   Modules accepted: Level of Service

## 2024-01-08 ENCOUNTER — Other Ambulatory Visit: Payer: Self-pay

## 2024-01-08 ENCOUNTER — Encounter (HOSPITAL_COMMUNITY): Payer: Self-pay

## 2024-01-08 ENCOUNTER — Telehealth: Admitting: Family Medicine

## 2024-01-08 ENCOUNTER — Observation Stay (HOSPITAL_COMMUNITY)
Admission: EM | Admit: 2024-01-08 | Discharge: 2024-01-09 | Disposition: A | Discharge status: Home / Self Care | Attending: General Surgery | Admitting: General Surgery

## 2024-01-08 ENCOUNTER — Emergency Department (HOSPITAL_COMMUNITY)

## 2024-01-08 ENCOUNTER — Ambulatory Visit: Payer: Self-pay

## 2024-01-08 DIAGNOSIS — R110 Nausea: Secondary | ICD-10-CM

## 2024-01-08 DIAGNOSIS — K3580 Unspecified acute appendicitis: Principal | ICD-10-CM | POA: Diagnosis present

## 2024-01-08 DIAGNOSIS — K3532 Acute appendicitis with perforation and localized peritonitis, without abscess: Principal | ICD-10-CM | POA: Insufficient documentation

## 2024-01-08 DIAGNOSIS — F17210 Nicotine dependence, cigarettes, uncomplicated: Secondary | ICD-10-CM | POA: Insufficient documentation

## 2024-01-08 DIAGNOSIS — R1110 Vomiting, unspecified: Secondary | ICD-10-CM

## 2024-01-08 DIAGNOSIS — R1031 Right lower quadrant pain: Secondary | ICD-10-CM

## 2024-01-08 DIAGNOSIS — R109 Unspecified abdominal pain: Secondary | ICD-10-CM | POA: Diagnosis present

## 2024-01-08 LAB — CBC
HCT: 52.4 % — ABNORMAL HIGH (ref 36.0–46.0)
Hemoglobin: 17.5 g/dL — ABNORMAL HIGH (ref 12.0–15.0)
MCH: 29.3 pg (ref 26.0–34.0)
MCHC: 33.4 g/dL (ref 30.0–36.0)
MCV: 87.6 fL (ref 80.0–100.0)
Platelets: 244 10*3/uL (ref 150–400)
RBC: 5.98 MIL/uL — ABNORMAL HIGH (ref 3.87–5.11)
RDW: 13.4 % (ref 11.5–15.5)
WBC: 12.5 10*3/uL — ABNORMAL HIGH (ref 4.0–10.5)
nRBC: 0 % (ref 0.0–0.2)

## 2024-01-08 LAB — URINALYSIS, ROUTINE W REFLEX MICROSCOPIC
Bilirubin Urine: NEGATIVE
Glucose, UA: NEGATIVE mg/dL
Ketones, ur: NEGATIVE mg/dL
Leukocytes,Ua: NEGATIVE
Nitrite: NEGATIVE
Protein, ur: NEGATIVE mg/dL
Specific Gravity, Urine: >1.046 — ABNORMAL HIGH (ref 1.005–1.030)
pH: 5 (ref 5.0–8.0)

## 2024-01-08 LAB — COMPREHENSIVE METABOLIC PANEL WITH GFR
ALT: 21 U/L (ref 0–44)
AST: 28 U/L (ref 15–41)
Albumin: 3.8 g/dL (ref 3.5–5.0)
Alkaline Phosphatase: 95 U/L (ref 38–126)
Anion gap: 16 — ABNORMAL HIGH (ref 5–15)
BUN: 8 mg/dL (ref 6–20)
CO2: 15 mmol/L — ABNORMAL LOW (ref 22–32)
Calcium: 8.6 mg/dL — ABNORMAL LOW (ref 8.9–10.3)
Chloride: 104 mmol/L (ref 98–111)
Creatinine, Ser: 0.97 mg/dL (ref 0.44–1.00)
GFR, Estimated: >60 mL/min (ref >60)
Glucose, Bld: 149 mg/dL — ABNORMAL HIGH (ref 70–99)
Potassium: 3.8 mmol/L (ref 3.5–5.1)
Sodium: 135 mmol/L (ref 135–145)
Total Bilirubin: 1.2 mg/dL (ref 0.0–1.2)
Total Protein: 7 g/dL (ref 6.5–8.1)

## 2024-01-08 LAB — LIPASE, BLOOD: Lipase: 25 U/L (ref 11–51)

## 2024-01-08 MED ORDER — OXYCODONE-ACETAMINOPHEN 5-325 MG PO TABS
1.0000 | ORAL_TABLET | Freq: Once | ORAL | Status: AC
  Administered 2024-01-08: 1 via ORAL
  Filled 2024-01-08 (×2): qty 1

## 2024-01-08 MED ORDER — ONDANSETRON 4 MG PO TBDP
4.0000 mg | ORAL_TABLET | Freq: Once | ORAL | Status: AC
  Administered 2024-01-08: 4 mg via ORAL
  Filled 2024-01-08: qty 1

## 2024-01-08 MED ORDER — IOHEXOL 350 MG/ML SOLN
75.0000 mL | Freq: Once | INTRAVENOUS | Status: AC | PRN
  Administered 2024-01-08: 75 mL via INTRAVENOUS

## 2024-01-08 MED ORDER — ACETAMINOPHEN 325 MG PO TABS
650.0000 mg | ORAL_TABLET | Freq: Once | ORAL | Status: AC
  Administered 2024-01-08: 650 mg via ORAL
  Filled 2024-01-08: qty 2

## 2024-01-08 NOTE — ED Triage Notes (Signed)
 Patient awoke this am with headache, abdominal pain and vomiting. Diarrhea with same. Pain worse with movement. Patient states that it hurts now more RLQ-no relief with phenergan  that she took at home.

## 2024-01-08 NOTE — ED Provider Triage Note (Signed)
 Emergency Medicine Provider Triage Evaluation Note  Jennifer [name] , a 58 y.o. female  was evaluated in triage.  Pt complains of severe abdominal pain, vomiting, diarrhea most focally in the right lower quadrant.  Took Phenergan  at home with no relief.  History of multiple urinary tract infections, kidney stones.  Has felt fever, chills.  Elevated temperature at 100 Fahrenheit on arrival..  Review of Systems  Positive: Flank pain, right lower quadrant pain, nausea, vomiting, diarrhea, dysuria, urinary incontinence Negative:   Physical Exam  BP (!) 141/73 (BP Location: Right Arm)   Pulse (!) 126   Temp 100 F (37.8 C)   Resp (!) 22   SpO2 96%  Gen:   Awake, no distress   Resp:  Normal effort  MSK:   Moves extremities without difficulty  Other:  Focal RLQ ttp palpation  Medical Decision Making  Medically screening exam initiated at 6:51 PM.  Appropriate orders placed.  Cheryllynn Sarff Ahmad was informed that the remainder of the evaluation will be completed by another provider, this initial triage assessment does not replace that evaluation, and the importance of remaining in the ED until their evaluation is complete.  Workup initiated in triage    Nelly Banco, [place] 01/08/24 1851

## 2024-01-08 NOTE — Patient Instructions (Signed)
Go to the ED

## 2024-01-08 NOTE — Telephone Encounter (Signed)
 Copied From CRM [id number]. Reason for Triage: patient has been up all night with diarrhea and has been throwing up, have a headache and stomach is hurting. think it could be food poisoning from the peaches she ate last night but not sure. Taking phernagan.  Chief Complaint: Diarrhea Symptoms: Abdominal pain Frequency: Since last night Pertinent Negatives: Patient denies fever Disposition: [] ED /[x] Urgent Care (no appt availability in office) / [] Appointment(In office/virtual)/ []  [facility]/ [] Home Care/ [] Refused Recommended Disposition /[] [facility]/ []  Follow-up with PCP Additional Notes: Patient called in to report diarrhea that started last night. Patient believes diarrhea to be related to food poisoning. Patient denied vomiting at this time, but stated she is dry heaving. Patient denied fever. Patient also reported abdominal pain that improves after she uses the bathroom. Advised patient to be seen within 4 hours, per protocol. No availability in office. Advised UC. Scheduled patient for virtual UC appointment this afternoon. Provided care advice and instructed patient to call back if symptoms worsen. Patient complied.   Reason for Disposition  [1] SEVERE diarrhea (e.g., 7 or more times / day more than normal) AND [2] age > 60 years  Answer Assessment - Initial Assessment Questions 1. DIARRHEA SEVERITY: "How bad is the diarrhea?" "How many more stools have you had in the past 24 hours than normal?"    - NO DIARRHEA (SCALE 0)   - MILD (SCALE 1-3): Few loose or mushy BMs; increase of 1-3 stools over normal daily number of stools; mild increase in ostomy output.   -  MODERATE (SCALE 4-7): Increase of 4-6 stools daily over normal; moderate increase in ostomy output.   -  SEVERE (SCALE 8-10; OR "WORST POSSIBLE"): Increase of 7 or more stools daily over normal; moderate increase in ostomy output; incontinence.     States she has been on the toilet every hour and has "a  little" diarrhea every time  2. ONSET: "When did the diarrhea begin?"      Last night 3. BM CONSISTENCY: "How loose or watery is the diarrhea?"      Watery- no formation 4. VOMITING: "Are you also vomiting?" If Yes, ask: "How many times in the past 24 hours?"      Denies vomiting, states she is dry heaving  5. ABDOMEN PAIN: "Are you having any abdomen pain?" If Yes, ask: "What does it feel like?" (e.g., crampy, dull, intermittent, constant)      States stomach hurts and feels better after she uses the bathroom 6. ABDOMEN PAIN SEVERITY: If present, ask: "How bad is the pain?"  (e.g., Scale 1-10; mild, moderate, or severe)   - MILD (1-3): doesn't interfere with normal activities, abdomen soft and not tender to touch    - MODERATE (4-7): interferes with normal activities or awakens from sleep, abdomen tender to touch    - SEVERE (8-10): excruciating pain, doubled over, unable to do any normal activities       Rates pain an 8 at this time 7. ORAL INTAKE: If vomiting, "Have you been able to drink liquids?" "How much liquids have you had in the past 24 hours?"     States she has not been able to keep water  down  8. HYDRATION: "Any signs of dehydration?" (e.g., dry mouth [not just dry lips], too weak to stand, dizziness, new weight loss) "When did you last urinate?"     States she feels weak and tired 9. EXPOSURE: "Have you traveled to a foreign country recently?" "Have  you been exposed to anyone with diarrhea?" "Could you have eaten any food that was spoiled?"     Possible food poisoning- states inside of peach she ate yesterday looked like it had mold in it 10. ANTIBIOTIC USE: "Are you taking antibiotics now or have you taken antibiotics in the past 2 months?"     Denies 11. OTHER SYMPTOMS: "Do you have any other symptoms?" (e.g., fever, blood in stool)     Pale in the face last night and this morning, but not anymore, denies fever, denies blood in stool  Protocols used: [facility]

## 2024-01-08 NOTE — Progress Notes (Signed)
 Virtual Visit Consent   Jennifer [name], you are scheduled for a virtual visit with a [facility] provider today. Just as with appointments in the office, your consent must be obtained to participate. Your consent will be active for this visit and any virtual visit you may have with one of our providers in the next 365 days. If you have a MyChart account, a copy of this consent can be sent to you electronically.  As this is a virtual visit, video technology does not allow for your provider to perform a traditional examination. This may limit your provider's ability to fully assess your condition. If your provider identifies any concerns that need to be evaluated in person or the need to arrange testing (such as labs, EKG, etc.), we will make arrangements to do so. Although advances in technology are sophisticated, we cannot ensure that it will always work on either your end or our end. If the connection with a video visit is poor, the visit may have to be switched to a telephone visit. With either a video or telephone visit, we are not always able to ensure that we have a secure connection.  By engaging in this virtual visit, you consent to the provision of healthcare and authorize for your insurance to be billed (if applicable) for the services provided during this visit. Depending on your insurance coverage, you may receive a charge related to this service.  I need to obtain your verbal consent now. Are you willing to proceed with your visit today? Jennifer [name] has provided verbal consent on 01/08/2024 for a virtual visit (video or telephone). Jennifer Pion, NP  Date: 01/08/2024 3:41 PM   Virtual Visit via Video Note   I, Jennifer [name], connected with  Jennifer [name]  ([id number], 1966/07/28) on 01/08/24 at  3:45 PM EDT by a video-enabled telemedicine application and verified that I am speaking with the correct person using two identifiers.  Location: Patient: Virtual Visit Location  Patient: Home Provider: Virtual Visit Location Provider: Home Office   I discussed the limitations of evaluation and management by telemedicine and the availability of in person appointments. The patient expressed understanding and agreed to proceed.    History of Present Illness: Jennifer [name] is a 58 y.o. who identifies as a female who was assigned female at birth, and is being seen today for abdominal pain and vomiting  Onset was last night with nausea and vomiting, dry heaving a lot Associated symptoms are cramping, feverish this morning- but no temp read, pain to the right lower quad.  Modifying factors are phenergan  (is not helping at all) Denies chest pain, shortness of breath    Problems:  Patient Active Problem List   Diagnosis Date Noted   Swelling of upper lip 04/05/2023   Abscess, furuncle and carbuncle of nose 04/05/2023   Diarrhea due to drug 03/26/2023   PTSD (post-traumatic stress disorder) 03/23/2023   Abnormal glucose 01/15/2023   Vitamin B 12 deficiency 01/15/2023   Vitamin D  deficiency 01/15/2023   Adenomatous polyp of colon 12/29/2022   History of colonic polyps 12/29/2022   Contact dermatitis and eczema due to plant 04/19/2022   Urinary tract infection without hematuria 04/19/2022   Dysuria 03/30/2022   Back pain of lumbar region with sciatica 03/30/2022   Diarrhea of infectious origin 03/30/2022   History of Clostridioides difficile infection 03/30/2022   Abnormal urine 03/30/2022   Tremor 03/20/2022   Vertigo 03/20/2022   Hypokalemia 01/28/2022  Leukocytosis 01/28/2022   Other fatigue 01/28/2022   Hypomagnesemia 01/28/2022   Elevated lactic acid level 01/28/2022   Generalized abdominal pain 01/28/2022   Diarrhea of presumed infectious origin 01/06/2022   Nephrolithiasis 01/06/2022   Female stress incontinence 09/07/2021   Vaginal vault smear abnormal 09/07/2021   Osteopenia 09/29/2020   Chronic cough 03/26/2020   Aortic atherosclerosis  (HCC) 09/12/2019   Reactive airway disease 09/12/2019   Hyperlipidemia 09/12/2019   GAD (generalized anxiety disorder) 02/18/2016   MDD (major depressive disorder), recurrent episode, mild (HCC) 02/18/2016   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   OBESITY 02/28/2007   Acquired neutrophilia 02/21/2007   Tobacco use 02/21/2007   ECZEMA 02/21/2007    Allergies:  Allergies  Allergen Reactions   Ceftriaxone  Rash    Significant full body rash,  Tolerated cefepime  5/22   Nitrofurantoin Monohyd Macro Nausea And Vomiting    Body aches   Sulfa Antibiotics Nausea Only and Rash    Fever and disorientation   Medications:  Current Outpatient Medications:    acetaminophen  (TYLENOL ) 325 MG tablet, Take 650 mg by mouth every 6 (six) hours as needed for moderate pain., Disp: , Rfl:    albuterol  (VENTOLIN  HFA) 108 (90 Base) MCG/ACT inhaler, Inhale 1-2 puffs into the lungs every 6 (six) hours as needed for wheezing or shortness of breath., Disp: 6.7 g, Rfl: 0   ASPIRIN  LOW DOSE 81 MG EC tablet, TAKE ONE TABLET BY MOUTH DAILY, Disp: 90 tablet, Rfl: 3   atorvastatin  (LIPITOR) 10 MG tablet, Take 1 tablet (10 mg total) by mouth every evening., Disp: 90 tablet, Rfl: 3   clindamycin (CLINDAGEL) 1 % gel, Apply 1 application  topically daily as needed (For lips)., Disp: , Rfl:    Cyanocobalamin  (VITAMIN B-12) 1000 MCG SUBL, Place 1 tablet (1,000 mcg total) under the tongue daily. (Patient not taking: Reported on 10/09/2023), Disp: 90 tablet, Rfl: 3   divalproex  (DEPAKOTE  ER) 250 MG 24 hr tablet, Take 1 tablet (250 mg total) by mouth daily., Disp: 90 tablet, Rfl: 0   ipratropium (ATROVENT) 0.06 % nasal spray, Place 1 spray into both nostrils daily as needed for rhinitis., Disp: , Rfl:    Multiple Vitamin (MULTIVITAMIN) capsule, Take 1 capsule by mouth daily., Disp: , Rfl:    oxybutynin (DITROPAN-XL) 5 MG 24 hr tablet, Take 5 mg by mouth at bedtime., Disp: , Rfl:    PARoxetine  (PAXIL ) 40 MG tablet, Take 1.5  tablets (60 mg total) by mouth every morning., Disp: 135 tablet, Rfl: 0   Probiotic Product (UP4 PROBIOTICS ADULT PO), Take by mouth daily., Disp: , Rfl:    promethazine  (PHENERGAN ) 25 MG tablet, Take 1 tablet (25 mg total) by mouth every 6 (six) hours as needed for nausea or vomiting. (Patient not taking: Reported on 10/09/2023), Disp: 30 tablet, Rfl: 0   propranolol  (INDERAL ) 10 MG tablet, Take 1 tablet (10 mg total) by mouth 2 (two) times daily as needed (Anxiety)., Disp: 180 tablet, Rfl: 0   sodium fluoride (FLUORISHIELD) 1.1 % GEL dental gel, Place 1 application  onto teeth every other day. (Patient not taking: Reported on 10/09/2023), Disp: , Rfl:    valACYclovir (VALTREX) 1000 MG tablet, Take 1,000 mg by mouth daily as needed (for fever blisters)., Disp: , Rfl:    vancomycin  (VANCOCIN ) 125 MG capsule, Take 1 capsule (125 mg total) by mouth daily. (Patient not taking: Reported on 10/09/2023), Disp: 12 capsule, Rfl: 0  Observations/Objective: Patient is well-developed, well-nourished, in acute distress.  Resting  comfortably  at home.  Head is normocephalic, atraumatic.  No labored breathing.  Speech is clear and coherent with logical content.  Patient is alert and oriented at baseline.    Assessment and Plan:   1. Right lower quadrant abdominal pain (Primary)   2. Nausea   3. Vomiting and diarrhea   I have sent her to the ED- concern for appendicitis- she reports nausea and vomiting started early and then a fever this morning, then RLQ pain and diarrhea.     Patient acknowledged agreement and understanding of the plan.     Follow Up Instructions: I discussed the assessment and treatment plan with the patient. The patient was provided an opportunity to ask questions and all were answered. The patient agreed with the plan and demonstrated an understanding of the instructions.  A copy of instructions were sent to the patient via MyChart unless otherwise noted below.    The patient  was advised to call back or seek an in-person evaluation if the symptoms worsen or if the condition fails to improve as anticipated.    Jennifer Pion, NP

## 2024-01-09 ENCOUNTER — Other Ambulatory Visit: Payer: Self-pay

## 2024-01-09 ENCOUNTER — Observation Stay (HOSPITAL_COMMUNITY)

## 2024-01-09 ENCOUNTER — Encounter (HOSPITAL_COMMUNITY): Payer: Self-pay

## 2024-01-09 ENCOUNTER — Encounter (HOSPITAL_COMMUNITY)
Admission: EM | Disposition: A | Discharge status: Home / Self Care | Payer: Self-pay | Source: Home / Self Care | Attending: Emergency Medicine

## 2024-01-09 DIAGNOSIS — K3580 Unspecified acute appendicitis: Secondary | ICD-10-CM

## 2024-01-09 DIAGNOSIS — E785 Hyperlipidemia, unspecified: Secondary | ICD-10-CM | POA: Diagnosis not present

## 2024-01-09 DIAGNOSIS — F418 Other specified anxiety disorders: Secondary | ICD-10-CM

## 2024-01-09 HISTORY — PX: LAPAROSCOPIC APPENDECTOMY: SHX408

## 2024-01-09 LAB — BASIC METABOLIC PANEL WITH GFR
Anion gap: 13 (ref 5–15)
BUN: 14 mg/dL (ref 6–20)
CO2: 21 mmol/L — ABNORMAL LOW (ref 22–32)
Calcium: 8.4 mg/dL — ABNORMAL LOW (ref 8.9–10.3)
Chloride: 102 mmol/L (ref 98–111)
Creatinine, Ser: 1.03 mg/dL — ABNORMAL HIGH (ref 0.44–1.00)
GFR, Estimated: >60 mL/min (ref >60)
Glucose, Bld: 126 mg/dL — ABNORMAL HIGH (ref 70–99)
Potassium: 3.5 mmol/L (ref 3.5–5.1)
Sodium: 136 mmol/L (ref 135–145)

## 2024-01-09 LAB — CBC
HCT: 45.5 % (ref 36.0–46.0)
Hemoglobin: 15.2 g/dL — ABNORMAL HIGH (ref 12.0–15.0)
MCH: 29.3 pg (ref 26.0–34.0)
MCHC: 33.4 g/dL (ref 30.0–36.0)
MCV: 87.7 fL (ref 80.0–100.0)
Platelets: 229 10*3/uL (ref 150–400)
RBC: 5.19 MIL/uL — ABNORMAL HIGH (ref 3.87–5.11)
RDW: 13.9 % (ref 11.5–15.5)
WBC: 24.6 10*3/uL — ABNORMAL HIGH (ref 4.0–10.5)
nRBC: 0 % (ref 0.0–0.2)

## 2024-01-09 SURGERY — APPENDECTOMY, LAPAROSCOPIC
Anesthesia: General

## 2024-01-09 MED ORDER — CHLORHEXIDINE GLUCONATE 0.12 % MT SOLN: 15.0000 mL | Freq: Once | OROMUCOSAL | Status: AC

## 2024-01-09 MED ORDER — FENTANYL CITRATE (PF) 250 MCG/5ML IJ SOLN
INTRAMUSCULAR | Status: AC
  Filled 2024-01-09: qty 5

## 2024-01-09 MED ORDER — SUCCINYLCHOLINE CHLORIDE 200 MG/10ML IV SOSY
PREFILLED_SYRINGE | INTRAVENOUS | Status: DC | PRN
  Administered 2024-01-09: 100 mg via INTRAVENOUS

## 2024-01-09 MED ORDER — CHLORHEXIDINE GLUCONATE 0.12 % MT SOLN
OROMUCOSAL | Status: AC
  Administered 2024-01-09: 15 mL via OROMUCOSAL
  Filled 2024-01-09: qty 15

## 2024-01-09 MED ORDER — HYDROMORPHONE HCL 1 MG/ML IJ SOLN
0.2500 mg | INTRAMUSCULAR | Status: DC | PRN
  Administered 2024-01-09 (×4): 0.5 mg via INTRAVENOUS

## 2024-01-09 MED ORDER — ONDANSETRON HCL 4 MG/2ML IJ SOLN
4.0000 mg | Freq: Once | INTRAMUSCULAR | Status: AC
  Administered 2024-01-09: 4 mg via INTRAVENOUS
  Filled 2024-01-09: qty 2

## 2024-01-09 MED ORDER — SUCCINYLCHOLINE CHLORIDE 200 MG/10ML IV SOSY
PREFILLED_SYRINGE | INTRAVENOUS | Status: AC
  Filled 2024-01-09: qty 10

## 2024-01-09 MED ORDER — OXYCODONE HCL 5 MG/5ML PO SOLN: 5.0000 mg | Freq: Once | ORAL | Status: AC | PRN

## 2024-01-09 MED ORDER — ORAL CARE MOUTH RINSE: 15.0000 mL | Freq: Once | OROMUCOSAL | Status: AC

## 2024-01-09 MED ORDER — FENTANYL CITRATE (PF) 250 MCG/5ML IJ SOLN
INTRAMUSCULAR | Status: DC | PRN
Start: 2024-01-09 — End: 2024-01-09
  Administered 2024-01-09: 100 ug via INTRAVENOUS

## 2024-01-09 MED ORDER — ONDANSETRON HCL 4 MG/2ML IJ SOLN
INTRAMUSCULAR | Status: AC
Start: 2024-01-09 — End: ?
  Filled 2024-01-09: qty 2

## 2024-01-09 MED ORDER — LIDOCAINE 2% (20 MG/ML) 5 ML SYRINGE
INTRAMUSCULAR | Status: DC | PRN
  Administered 2024-01-09: 100 mg via INTRAVENOUS

## 2024-01-09 MED ORDER — ROCURONIUM BROMIDE 10 MG/ML (PF) SYRINGE
PREFILLED_SYRINGE | INTRAVENOUS | Status: AC
Start: 2024-01-09 — End: ?
  Filled 2024-01-09: qty 10

## 2024-01-09 MED ORDER — LACTATED RINGERS IV SOLN: INTRAVENOUS | Status: DC

## 2024-01-09 MED ORDER — HYDROMORPHONE HCL 1 MG/ML IJ SOLN
INTRAMUSCULAR | Status: AC
  Filled 2024-01-09: qty 1

## 2024-01-09 MED ORDER — FENTANYL CITRATE (PF) 100 MCG/2ML IJ SOLN
INTRAMUSCULAR | Status: AC
Start: 2024-01-09 — End: 2024-01-09
  Administered 2024-01-09: 50 ug via INTRAVENOUS
  Filled 2024-01-09: qty 2

## 2024-01-09 MED ORDER — ATORVASTATIN CALCIUM 10 MG PO TABS: 10.0000 mg | ORAL_TABLET | Freq: Every evening | ORAL | Status: DC

## 2024-01-09 MED ORDER — BUPIVACAINE HCL (PF) 0.25 % IJ SOLN
INTRAMUSCULAR | Status: AC
  Filled 2024-01-09: qty 30

## 2024-01-09 MED ORDER — PHENYLEPHRINE 80 MCG/ML (10ML) SYRINGE FOR IV PUSH (FOR BLOOD PRESSURE SUPPORT)
PREFILLED_SYRINGE | INTRAVENOUS | Status: DC | PRN
  Administered 2024-01-09: 160 ug via INTRAVENOUS

## 2024-01-09 MED ORDER — ENOXAPARIN SODIUM 40 MG/0.4ML IJ SOSY
40.0000 mg | PREFILLED_SYRINGE | INTRAMUSCULAR | Status: DC
  Administered 2024-01-09: 40 mg via SUBCUTANEOUS
  Filled 2024-01-09: qty 0.4

## 2024-01-09 MED ORDER — PIPERACILLIN-TAZOBACTAM 3.375 G IVPB: 3.3750 g | Freq: Three times a day (TID) | INTRAVENOUS | Status: DC

## 2024-01-09 MED ORDER — PIPERACILLIN-TAZOBACTAM 3.375 G IVPB 30 MIN
3.3750 g | Freq: Once | INTRAVENOUS | Status: AC
  Administered 2024-01-09: 3.375 g via INTRAVENOUS
  Filled 2024-01-09: qty 50

## 2024-01-09 MED ORDER — DROPERIDOL 2.5 MG/ML IJ SOLN
0.6250 mg | Freq: Once | INTRAMUSCULAR | Status: DC | PRN
Start: 2024-01-09 — End: 2024-01-09

## 2024-01-09 MED ORDER — CIPROFLOXACIN HCL 500 MG PO TABS: 500.0000 mg | ORAL_TABLET | Freq: Two times a day (BID) | ORAL | 0 refills | Status: AC

## 2024-01-09 MED ORDER — OXYCODONE HCL 5 MG PO TABS: 5.0000 mg | ORAL_TABLET | ORAL | Status: DC | PRN

## 2024-01-09 MED ORDER — BUPIVACAINE HCL 0.25 % IJ SOLN
INTRAMUSCULAR | Status: DC | PRN
  Administered 2024-01-09: 7 mL

## 2024-01-09 MED ORDER — LIDOCAINE 2% (20 MG/ML) 5 ML SYRINGE
INTRAMUSCULAR | Status: AC
  Filled 2024-01-09: qty 5

## 2024-01-09 MED ORDER — SODIUM CHLORIDE 0.9 % IR SOLN
Status: DC | PRN
  Administered 2024-01-09: 1000 mL

## 2024-01-09 MED ORDER — PROPOFOL 10 MG/ML IV BOLUS
INTRAVENOUS | Status: AC
  Filled 2024-01-09: qty 20

## 2024-01-09 MED ORDER — 0.9 % SODIUM CHLORIDE (POUR BTL) OPTIME
TOPICAL | Status: DC | PRN
  Administered 2024-01-09: 1000 mL

## 2024-01-09 MED ORDER — ROCURONIUM BROMIDE 10 MG/ML (PF) SYRINGE
PREFILLED_SYRINGE | INTRAVENOUS | Status: DC | PRN
  Administered 2024-01-09: 40 mg via INTRAVENOUS

## 2024-01-09 MED ORDER — MORPHINE SULFATE (PF) 4 MG/ML IV SOLN
4.0000 mg | Freq: Once | INTRAVENOUS | Status: AC
  Administered 2024-01-09: 4 mg via INTRAVENOUS
  Filled 2024-01-09: qty 1

## 2024-01-09 MED ORDER — PHENYLEPHRINE 80 MCG/ML (10ML) SYRINGE FOR IV PUSH (FOR BLOOD PRESSURE SUPPORT)
PREFILLED_SYRINGE | INTRAVENOUS | Status: AC
  Filled 2024-01-09: qty 10

## 2024-01-09 MED ORDER — OXYCODONE-ACETAMINOPHEN 5-325 MG PO TABS: 1.0000 | ORAL_TABLET | ORAL | 0 refills | Status: DC | PRN

## 2024-01-09 MED ORDER — HYDROMORPHONE HCL 1 MG/ML IJ SOLN: 1.0000 mg | INTRAMUSCULAR | Status: DC | PRN

## 2024-01-09 MED ORDER — DIVALPROEX SODIUM ER 250 MG PO TB24
250.0000 mg | ORAL_TABLET | Freq: Every day | ORAL | Status: DC
  Filled 2024-01-09: qty 1

## 2024-01-09 MED ORDER — FENTANYL CITRATE (PF) 100 MCG/2ML IJ SOLN: 50.0000 ug | Freq: Once | INTRAMUSCULAR | Status: AC

## 2024-01-09 MED ORDER — MIDAZOLAM HCL 2 MG/2ML IJ SOLN
INTRAMUSCULAR | Status: AC
  Filled 2024-01-09: qty 2

## 2024-01-09 MED ORDER — METRONIDAZOLE 250 MG PO TABS: 500.0000 mg | ORAL_TABLET | Freq: Two times a day (BID) | ORAL | 0 refills | Status: AC

## 2024-01-09 MED ORDER — DEXAMETHASONE SODIUM PHOSPHATE 10 MG/ML IJ SOLN
INTRAMUSCULAR | Status: AC
Start: 2024-01-09 — End: ?
  Filled 2024-01-09: qty 1

## 2024-01-09 MED ORDER — METRONIDAZOLE 500 MG/100ML IV SOLN
500.0000 mg | Freq: Once | INTRAVENOUS | Status: AC
  Administered 2024-01-09: 500 mg via INTRAVENOUS
  Filled 2024-01-09: qty 100

## 2024-01-09 MED ORDER — PROPOFOL 10 MG/ML IV BOLUS
INTRAVENOUS | Status: DC | PRN
  Administered 2024-01-09: 150 mg via INTRAVENOUS

## 2024-01-09 MED ORDER — DEXAMETHASONE SODIUM PHOSPHATE 10 MG/ML IJ SOLN
INTRAMUSCULAR | Status: DC | PRN
  Administered 2024-01-09: 10 mg via INTRAVENOUS

## 2024-01-09 MED ORDER — ALBUTEROL SULFATE (2.5 MG/3ML) 0.083% IN NEBU: 2.5000 mg | INHALATION_SOLUTION | Freq: Four times a day (QID) | RESPIRATORY_TRACT | Status: DC | PRN

## 2024-01-09 MED ORDER — ACETAMINOPHEN 500 MG PO TABS
1000.0000 mg | ORAL_TABLET | Freq: Four times a day (QID) | ORAL | Status: DC
  Administered 2024-01-09: 1000 mg via ORAL
  Filled 2024-01-09: qty 2

## 2024-01-09 MED ORDER — CIPROFLOXACIN IN D5W 400 MG/200ML IV SOLN: 400.0000 mg | Freq: Once | INTRAVENOUS | Status: DC

## 2024-01-09 MED ORDER — ONDANSETRON HCL 4 MG/2ML IJ SOLN
4.0000 mg | Freq: Once | INTRAMUSCULAR | Status: DC | PRN
Start: 2024-01-09 — End: 2024-01-09

## 2024-01-09 MED ORDER — OXYCODONE HCL 5 MG PO TABS
ORAL_TABLET | ORAL | Status: AC
  Filled 2024-01-09: qty 1

## 2024-01-09 MED ORDER — PAROXETINE HCL 30 MG PO TABS
60.0000 mg | ORAL_TABLET | Freq: Every day | ORAL | Status: DC
  Filled 2024-01-09: qty 2

## 2024-01-09 MED ORDER — SUGAMMADEX SODIUM 200 MG/2ML IV SOLN
INTRAVENOUS | Status: DC | PRN
  Administered 2024-01-09: 200 mg via INTRAVENOUS

## 2024-01-09 MED ORDER — OXYCODONE HCL 5 MG PO TABS
5.0000 mg | ORAL_TABLET | Freq: Once | ORAL | Status: AC | PRN
  Administered 2024-01-09: 5 mg via ORAL

## 2024-01-09 MED ORDER — ONDANSETRON HCL 4 MG/2ML IJ SOLN
INTRAMUSCULAR | Status: DC | PRN
  Administered 2024-01-09: 4 mg via INTRAVENOUS

## 2024-01-09 SURGICAL SUPPLY — 36 items
BAG COUNTER SPONGE SURGICOUNT (BAG) ×1 IMPLANT
BLADE CLIPPER SURG (BLADE) IMPLANT
CANISTER SUCTION 3000ML PPV (SUCTIONS) ×1 IMPLANT
CHLORAPREP W/TINT 26 (MISCELLANEOUS) ×1 IMPLANT
CLIP APPLIE 5 13 M/L LIGAMAX5 (MISCELLANEOUS) IMPLANT
CLIP LIGATING HEMO LOK XL GOLD (MISCELLANEOUS) ×1 IMPLANT
COVER SURGICAL LIGHT HANDLE (MISCELLANEOUS) ×1 IMPLANT
COVER TRANSDUCER ULTRASND (DRAPES) ×1 IMPLANT
DERMABOND ADVANCED .7 DNX12 (GAUZE/BANDAGES/DRESSINGS) ×1 IMPLANT
ELECTRODE REM PT RTRN 9FT ADLT (ELECTROSURGICAL) ×1 IMPLANT
ENDOLOOP SUT PDS II 0 18 (SUTURE) IMPLANT
GLOVE BIO SURGEON STRL SZ7.5 (GLOVE) ×2 IMPLANT
GOWN STRL REUS W/ TWL LRG LVL3 (GOWN DISPOSABLE) ×2 IMPLANT
GOWN STRL REUS W/ TWL XL LVL3 (GOWN DISPOSABLE) ×1 IMPLANT
GRASPER SUT TROCAR 14GX15 (MISCELLANEOUS) ×1 IMPLANT
IRRIGATION SUCT STRKRFLW 2 WTP (MISCELLANEOUS) ×1 IMPLANT
KIT BASIN OR (CUSTOM PROCEDURE TRAY) ×1 IMPLANT
KIT TURNOVER KIT B (KITS) ×1 IMPLANT
NDL INSUFFLATION 14GA 120MM (NEEDLE) ×1 IMPLANT
NEEDLE INSUFFLATION 14GA 120MM (NEEDLE) ×1 IMPLANT
NS IRRIG 1000ML POUR BTL (IV SOLUTION) ×1 IMPLANT
PAD ARMBOARD POSITIONER FOAM (MISCELLANEOUS) ×2 IMPLANT
SCISSORS LAP 5X35 DISP (ENDOMECHANICALS) ×1 IMPLANT
SET TUBE SMOKE EVAC HIGH FLOW (TUBING) ×1 IMPLANT
SLEEVE Z-THREAD 5X100MM (TROCAR) ×1 IMPLANT
SPECIMEN JAR SMALL (MISCELLANEOUS) ×1 IMPLANT
SUT MNCRL AB 4-0 PS2 18 (SUTURE) ×1 IMPLANT
SUT VICRYL 0 UR6 27IN ABS (SUTURE) IMPLANT
TOWEL GREEN STERILE (TOWEL DISPOSABLE) ×1 IMPLANT
TOWEL GREEN STERILE FF (TOWEL DISPOSABLE) ×1 IMPLANT
TRAY FOLEY W/BAG SLVR 16FR ST (SET/KITS/TRAYS/PACK) IMPLANT
TRAY LAPAROSCOPIC MC (CUSTOM PROCEDURE TRAY) ×1 IMPLANT
TROCAR 11X100 Z THREAD (TROCAR) ×1 IMPLANT
TROCAR Z-THREAD OPTICAL 5X100M (TROCAR) ×1 IMPLANT
WARMER LAPAROSCOPE (MISCELLANEOUS) ×1 IMPLANT
WATER STERILE IRR 1000ML POUR (IV SOLUTION) ×1 IMPLANT

## 2024-01-09 NOTE — H&P (Signed)
 Jennifer [name] [facility] 1966-02-09  [id number].    HPI:  58 year old female with a history of nephrolithiasis and previous C.Diff requiring fecal transplant  who presented to the ED with 2 days of abdominal pain and diarrhea. CT was performed and shows acute appendicitis. WBC 12. She is AF and HDS.   Abdominal surgical history includes tubal ligation and umbilical hernia repair.   ROS: Review of Systems  Constitutional:  Positive for malaise/fatigue.  HENT: Negative.    Eyes: Negative.   Respiratory: Negative.    Cardiovascular: Negative.   Gastrointestinal:  Positive for abdominal pain and diarrhea.  Genitourinary: Negative.   Musculoskeletal: Negative.   Skin: Negative.   Neurological: Negative.   Endo/Heme/Allergies: Negative.   Psychiatric/Behavioral: Negative.      Family History  Problem Relation Age of Onset   Nephrolithiasis Mother    Skin cancer Mother        from radiation tx   Hyperlipidemia Mother    Squamous cell carcinoma Mother    CVA Father 7   Fibroids Sister    Other Sister        breast cyst   Skin cancer Sister    Hernia Maternal Grandmother    Pancreatic cancer Maternal Grandfather    Alcohol abuse Paternal Grandmother    Depression Paternal Grandmother    Suicidality Paternal Aunt    Depression Paternal Aunt    Non-Hodgkin's lymphoma Maternal Great-grandmother    Irritable bowel syndrome Other    Anesthesia problems Neg Hx    Hypotension Neg Hx    Malignant hyperthermia Neg Hx    Pseudochol deficiency Neg Hx    Colon cancer Neg Hx    Colon polyps Neg Hx    Esophageal cancer Neg Hx    Stomach cancer Neg Hx    Rectal cancer Neg Hx     Past Medical History:  Diagnosis Date   Anxiety    Cancer (HCC)    Depression    Depression with anxiety 10/15/2014   Diverticulosis    Fundic gland polyps of stomach, benign    History of kidney stones    Insomnia 02/18/2016   Low HDL (under 40)    MDD (major depressive disorder), recurrent episode,  mild (HCC) 02/18/2016   MDD (major depressive disorder), recurrent episode, moderate (HCC) 08/21/2023   Mild protein-calorie malnutrition (HCC) 01/06/2022   Mood disorder (HCC) 02/21/2007   Qualifier: Diagnosis of   By: Lennice Quivers CMA, Kelly Patient         Personal history of adenomatous colonic polyps 05/08/2012   Rosacea    Tenosynovitis, de Quervain    Urosepsis 10/2011   after stent    Past Surgical History:  Procedure Laterality Date   CERVICAL BIOPSY  W/ LOOP ELECTRODE EXCISION  04/2003   COLONOSCOPY     COLONOSCOPY WITH PROPOFOL  N/A 12/29/2022   Procedure: COLONOSCOPY WITH PROPOFOL ;  Surgeon: Luke Salaam, MD;  Location: [facility] ENDOSCOPY;  Service: Gastroenterology;  Laterality: N/A;   CYSTOSCOPY W/ URETERAL STENT PLACEMENT  10/14/2011   Procedure: CYSTOSCOPY WITH RETROGRADE PYELOGRAM/URETERAL STENT PLACEMENT;  Surgeon: Jinny Mounts, MD;  Location: [facility] ORS;  Service: Urology;  Laterality: Right;  Right Double J Stent   CYSTOSCOPY W/ URETERAL STENT PLACEMENT Left 12/26/2021   Procedure: CYSTOSCOPY WITH RETROGRADE PYELOGRAM/URETERAL STENT PLACEMENT;  Surgeon: Andrez Banker, MD;  Location: [facility] OR;  Service: Urology;  Laterality: Left;   CYSTOSCOPY/URETEROSCOPY/HOLMIUM LASER/STENT PLACEMENT Left 01/19/2022   Procedure: LEFT URETEROSCOPY/STENT EXCHANGE/BASKET STONE REMOVAL;  Surgeon: Andrez Banker, MD;  Location: [facility] ORS;  Service: Urology;  Laterality: Left;   MULTIPLE TOOTH EXTRACTIONS     OOPHORECTOMY     left ovary   POLYPECTOMY     TUBAL LIGATION     UMBILICAL HERNIA REPAIR     UPPER GASTROINTESTINAL ENDOSCOPY  2008   VAGINAL HYSTERECTOMY     WISDOM TOOTH EXTRACTION      Social History:  reports that she has been smoking cigarettes. She has a 7.5 pack-year smoking history. She has never used smokeless tobacco. She reports that she does not drink alcohol and does not use drugs.  Allergies:  Allergies  Allergen Reactions   Ceftriaxone  Rash    Significant full body rash,   Tolerated cefepime  5/22   Nitrofurantoin Monohyd Macro Nausea And Vomiting    Body aches   Sulfa Antibiotics Nausea Only and Rash    Fever and disorientation    (Not in a hospital admission)   Physical Exam: Blood pressure 115/82, pulse 76, temperature 98.5 F (36.9 C), temperature source Oral, resp. rate (!) 22, SpO2 96%. Gen: female, NAD Abd: soft, non-distended, supraumbilical scar, TTP in the RLQ, no rebound/guarding, no peritoneal signs  Results for orders placed or performed during the hospital encounter of 01/08/24 (from the past 48 hours)  Lipase, blood     Status: None   Collection Time: 01/08/24  5:55 PM  Result Value Ref Range   Lipase 25 11 - 51 U/L    Comment: Performed at [facility], [address], [place], [place] [address]  Comprehensive metabolic panel     Status: Abnormal   Collection Time: 01/08/24  5:55 PM  Result Value Ref Range   Sodium 135 135 - 145 mmol/L   Potassium 3.8 3.5 - 5.1 mmol/L   Chloride 104 98 - 111 mmol/L   CO2 15 (L) 22 - 32 mmol/L   Glucose, Bld 149 (H) 70 - 99 mg/dL    Comment: Glucose reference range applies only to samples taken after fasting for at least 8 hours.   BUN 8 6 - 20 mg/dL   Creatinine, Ser 6.04 0.44 - 1.00 mg/dL   Calcium  8.6 (L) 8.9 - 10.3 mg/dL   Total Protein 7.0 6.5 - 8.1 g/dL   Albumin 3.8 3.5 - 5.0 g/dL   AST 28 15 - 41 U/L   ALT 21 0 - 44 U/L   Alkaline Phosphatase 95 38 - 126 U/L   Total Bilirubin 1.2 0.0 - 1.2 mg/dL   GFR, Estimated >54 >09 mL/min    Comment: (NOTE) Calculated using the CKD-EPI Creatinine Equation (2021)    Anion gap 16 (H) 5 - 15    Comment: Performed at [facility], [address], [place], [place] [address]  CBC     Status: Abnormal   Collection Time: 01/08/24  5:55 PM  Result Value Ref Range   WBC 12.5 (H) 4.0 - 10.5 K/uL   RBC 5.98 (H) 3.87 - 5.11 MIL/uL   Hemoglobin 17.5 (H) 12.0 - 15.0 g/dL   HCT 47.8 (H) 29.5 - 62.1 %   MCV 87.6 80.0 - 100.0 fL   MCH 29.3 26.0  - 34.0 pg   MCHC 33.4 30.0 - 36.0 g/dL   RDW 30.8 65.7 - 84.6 %   Platelets 244 150 - 400 K/uL   nRBC 0.0 0.0 - 0.2 %    Comment: Performed at [facility], [address], [place],  [place] [address]  Urinalysis, Routine w reflex microscopic -Urine, Clean Catch     Status: Abnormal   Collection Time: 01/08/24 10:29 PM  Result Value Ref Range   Color, Urine YELLOW YELLOW   APPearance HAZY (A) CLEAR   Specific Gravity, Urine >1.046 (H) 1.005 - 1.030   pH 5.0 5.0 - 8.0   Glucose, UA NEGATIVE NEGATIVE mg/dL   Hgb urine dipstick SMALL (A) NEGATIVE   Bilirubin Urine NEGATIVE NEGATIVE   Ketones, ur NEGATIVE NEGATIVE mg/dL   Protein, ur NEGATIVE NEGATIVE mg/dL   Nitrite NEGATIVE NEGATIVE   Leukocytes,Ua NEGATIVE NEGATIVE   RBC / HPF 0-5 0 - 5 RBC/hpf   WBC, UA 0-5 0 - 5 WBC/hpf   Bacteria, UA RARE (A) NONE SEEN   Squamous Epithelial / HPF 0-5 0 - 5 /HPF   Mucus PRESENT     Comment: Performed at [facility], [address], [place], [place] [address]   CT ABDOMEN PELVIS W CONTRAST Result Date: 01/08/2024 CLINICAL DATA:  Acute abdominal pain.  Flank pain. EXAM: CT ABDOMEN AND PELVIS WITH CONTRAST TECHNIQUE: Multidetector CT imaging of the abdomen and pelvis was performed using the standard protocol following bolus administration of intravenous contrast. RADIATION DOSE REDUCTION: This exam was performed according to the departmental dose-optimization program which includes automated exposure control, adjustment of the mA and/or kV according to patient size and/or use of iterative reconstruction technique. CONTRAST:  75mL OMNIPAQUE  IOHEXOL  350 MG/ML SOLN COMPARISON:  CT 01/24/2022 FINDINGS: Lower chest: Subsegmental atelectasis in the lower lobes. No pleural effusion. Hepatobiliary: The liver is enlarged spanning 18.7 cm cranial caudal. There is diffuse hepatic steatosis. No evidence of focal liver lesion. Layering density in the gallbladder may represent sludge or stones. No  pericholecystic inflammation. No biliary dilatation. Pancreas: Unremarkable. No pancreatic ductal dilatation or surrounding inflammatory changes. Spleen: Normal in size without focal abnormality. Adrenals/Urinary Tract: Multiple punctate right renal calculi. No hydronephrosis. Question of punctate left upper pole renal calculus, motion obscured. No renal inflammation. Decompressed ureters. Partially distended urinary bladder, normal for degree of distension. Stomach/Bowel: The appendix is dilated measuring 12 mm and fluid-filled, series 2, image 57. Moderate periappendiceal fat stranding and edema. Faint appendicolith at the base of the appendix. No perforation or abscess. Scattered fluid within nondilated small bowel likely reactive. No obstruction. Small volume of formed stool in the colon. 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 This is nonobstructing and of doubtful clinical significance. The stomach is nondistended. Vascular/Lymphatic: Aortic atherosclerosis without aneurysm. Patent portal vein. Few prominent lymph nodes in the ileocolic chain likely reactive. Reproductive: Status post hysterectomy. No adnexal masses. Other: No ascites or free air. Minimal fat containing umbilical hernia. Musculoskeletal: Facet hypertrophy in the lower lumbar spine. There are no acute or suspicious osseous abnormalities. IMPRESSION: 1. Acute uncomplicated appendicitis. No perforation or abscess. 2. Nonobstructing right renal calculi. Question of punctate left renal calculus. 3. Hepatomegaly with hepatic steatosis. 4. Layering density in the gallbladder may represent sludge or stones. No pericholecystic inflammation. Aortic Atherosclerosis (ICD10-I70.0). Electronically Signed   By: Chadwick Colonel M.D.   On: 01/08/2024 21:10    Assessment/Plan 58 y/o F w/ acute appendicitis   - Admit to [facility], med/surg - Zosyn  - NPO with MIVF - Will plan for possible  OR today, pending availability   Trula Gable Surgery 01/09/2024, 5:43 AM Please see Amion for pager number during day hours 7:00am-4:30pm or  7:00am -11:30am on weekends

## 2024-01-09 NOTE — Op Note (Signed)
 01/09/2024  9:54 AM  PATIENT:  Jennifer [name]  58 y.o. female  PRE-OPERATIVE DIAGNOSIS:  appendicitis  POST-OPERATIVE DIAGNOSIS:  acute appendicitis with local perforation  PROCEDURE:  Procedure(s): APPENDECTOMY, LAPAROSCOPIC (N/A)  SURGEON:  Surgeons and Role:    Shela Derby, MD - Primary  PHYSICIAN ASSISTANT:   ASSISTANTS: none   ANESTHESIA:   local and general  EBL:  minimal   BLOOD ADMINISTERED:none  DRAINS: none   LOCAL MEDICATIONS USED:  BUPIVICAINE   SPECIMEN:  Source of Specimen:  appendix  DISPOSITION OF SPECIMEN:  PATHOLOGY  COUNTS:  YES  TOURNIQUET:  * No tourniquets in log *  DICTATION: .Dragon Dictation  Complications: none  Counts: reported as correct x 2  Findings:  The patient had a acutely inflamed, locally perforated appendix  Specimen: Appendix  Indications for procedure:  The patient is a 58 year old female with a history of periumbilical pain localized in the right lower quadrant patient had a CT scan which revealed signs consistent with acute appendicitis the patient back in for laparoscopic appendectomy.  Details of the procedure:The patient was taken back to the operating room. The patient was placed in supine position with bilateral SCDs in place. The patient was prepped and draped in the usual sterile fashion.  After appropriate anitbiotics were confirmed, a time-out was confirmed and all facts were verified.    A pneumoperitoneum of 14 mmHg was obtained via a Veress needle technique in the left lower quadrant quadrant.  A 10 mm trocar and 5 mm camera then placed intra-abdominally there is no injury to any intra-abdominal organs a 5 mm supraumbilical port was placed and direct visualization as was a 5 mm port in the suprapubic area.   The appendix was identified and seen to be inflamed and locally perforated.  The appendix was cleaned down to the appendiceal base. The mesoappendix was then incised and the appendiceal artery was  cauterized.  The the appendiceal base was clean.  A gold hemoclip was placed proximallyx2 and one distally and the appendix was transected between these 2. A retrieval bag was then placed into the abdomen and the specimen placed in the bag. The appendiceal stump was cauterized. We evacuate the fluid from the pelvis until the effluent was clear.  The appendix and retrieval  bag was then retrieved via the supraumbilical port. #1 Vicryl was used to reapproximate the fascia at the left lower quadrant port site x1. The skin was reapproximated all port sites 4-0 Monocryl subcuticular fashion. The skin was dressed with Dermabond.  The patient was awakened from general anesthesia was taken to recovery room in stable condition.     PLAN OF CARE: Discharge to home after PACU  PATIENT DISPOSITION:  PACU - hemodynamically stable.   Delay start of Pharmacological VTE agent (>24hrs) due to surgical blood loss or risk of bleeding: not applicable  Upon entering the abdomen (organ space), I encountered an abscess in the appendix.  CASE DATA:  Type of patient?: DOW CASE ([facility])  Status of Case? URGENT Add On  Infection Present At Time Of Surgery (PATOS)?  PURULENCE at the base of the appendix

## 2024-01-09 NOTE — Anesthesia Procedure Notes (Signed)
 Procedure Name: Intubation Date/Time: 01/09/2024 9:17 AM  Performed by: Tura Gaines, MDPre-anesthesia Checklist: Patient identified, Emergency Drugs available, Suction available and Patient being monitored Patient Re-evaluated:Patient Re-evaluated prior to induction Oxygen Delivery Method: Circle System Utilized Preoxygenation: Pre-oxygenation with 100% oxygen Induction Type: IV induction and Rapid sequence Laryngoscope Size: Miller and 2 Grade View: Grade I Tube type: Oral Tube size: 7.0 mm Number of attempts: 1 Airway Equipment and Method: Stylet and Oral airway Placement Confirmation: ETT inserted through vocal cords under direct vision, positive ETCO2 and breath sounds checked- equal and bilateral Secured at: 21 cm Tube secured with: Tape Dental Injury: Teeth and Oropharynx as per pre-operative assessment

## 2024-01-09 NOTE — Anesthesia Preprocedure Evaluation (Signed)
 Anesthesia Evaluation  Patient identified by MRN, date of birth, ID band Patient awake    Reviewed: Allergy & Precautions, NPO status , Patient's Chart, lab work & pertinent test results, reviewed documented beta blocker date and time   Airway Mallampati: II  TM Distance: >3 FB     Dental  (+) Missing, Poor Dentition, Caps, Dental Advisory Given, Edentulous Upper   Pulmonary neg pulmonary ROS, Current Smoker and Patient abstained from smoking.   Pulmonary exam normal breath sounds clear to auscultation       Cardiovascular negative cardio ROS Normal cardiovascular exam Rhythm:Regular Rate:Normal     Neuro/Psych  PSYCHIATRIC DISORDERS Anxiety Depression     Neuromuscular disease    GI/Hepatic Neg liver ROS,,,Acute appendicitis   Endo/Other  Obesity  Renal/GU Renal diseaseHx/o renal calculi  negative genitourinary   Musculoskeletal Osteopenia   Abdominal  (+) + obese  Peds  Hematology negative hematology ROS (+)   Anesthesia Other Findings   Reproductive/Obstetrics                              Anesthesia Physical Anesthesia Plan  ASA: 3 and emergent  Anesthesia Plan: General   Post-op Pain Management: Minimal or no pain anticipated   Induction: Intravenous and Cricoid pressure planned  PONV Risk Score and Plan: 4 or greater and Treatment may vary due to age or medical condition, Midazolam , Ondansetron  and Dexamethasone   Airway Management Planned: Oral ETT  Additional Equipment: None  Intra-op Plan:   Post-operative Plan: Extubation in OR  Informed Consent: I have reviewed the patients History and Physical, chart, labs and discussed the procedure including the risks, benefits and alternatives for the proposed anesthesia with the patient or authorized representative who has indicated his/her understanding and acceptance.     Dental advisory given  Plan Discussed with: CRNA and  Anesthesiologist  Anesthesia Plan Comments:          Anesthesia Quick Evaluation

## 2024-01-09 NOTE — Interval H&P Note (Signed)
 History and Physical Interval Note:  01/09/2024 8:19 AM  Jennifer [name]  has presented today for surgery, with the diagnosis of appendicitis.  The various methods of treatment have been discussed with the patient and family. After consideration of risks, benefits and other options for treatment, the patient has consented to  Procedure(s): APPENDECTOMY, LAPAROSCOPIC (N/A) as a surgical intervention.  The patient's history has been reviewed, patient examined, no change in status, stable for surgery.  I have reviewed the patient's chart and labs.  Questions were answered to the patient's satisfaction.     Jennifer [name]

## 2024-01-09 NOTE — Anesthesia Postprocedure Evaluation (Signed)
 Anesthesia Post Note  Patient: Jennifer [name]  Procedure(s) Performed: APPENDECTOMY, LAPAROSCOPIC     Patient location during evaluation: PACU Anesthesia Type: General Level of consciousness: awake and alert and oriented Pain management: pain level controlled Vital Signs Assessment: post-procedure vital signs reviewed and stable Respiratory status: spontaneous breathing, nonlabored ventilation and respiratory function stable Cardiovascular status: blood pressure returned to baseline and stable Postop Assessment: no apparent nausea or vomiting Anesthetic complications: no   No notable events documented.  Last Vitals:  Vitals:   01/09/24 1030 01/09/24 1045  BP: 124/69 118/60  Pulse: 87 85  Resp: (!) 28 (!) 22  Temp:    SpO2: 92% 93%    Last Pain:  Vitals:   01/09/24 1055  TempSrc:   PainSc: 6                  Danner Paulding A.

## 2024-01-09 NOTE — Transfer of Care (Signed)
 Immediate Anesthesia Transfer of Care Note  Patient: Jennifer [name]  Procedure(s) Performed: APPENDECTOMY, LAPAROSCOPIC  Patient Location: PACU  Anesthesia Type:General  Level of Consciousness: drowsy  Airway & Oxygen Therapy: Patient Spontanous Breathing and Patient connected to face mask oxygen  Post-op Assessment: Report given to RN and Post -op Vital signs reviewed and stable  Post vital signs: Reviewed and stable  Last Vitals:  Vitals Value Taken Time  BP 146/68 01/09/24 1003  Temp    Pulse    Resp 28 01/09/24 1005  SpO2    Vitals shown include unfiled device data.  Last Pain:  Vitals:   01/09/24 0833  TempSrc: Oral  PainSc:          Complications: No notable events documented.

## 2024-01-09 NOTE — Discharge Instructions (Signed)

## 2024-01-09 NOTE — ED Provider Notes (Signed)
 [place] EMERGENCY DEPARTMENT AT [facility] Provider Note   CSN: [id number] Arrival date & time: 01/08/24  1633     History  No chief complaint on file.   Jennifer [name] is a 58 y.o. female.  Patient with past medical history significant for aortic atherosclerosis, osteopenia, C. difficile presents to the emergency department complaining of abdominal pain with nausea and diarrhea.  Patient states pain started suddenly last night.  She thought she ate a bad peach and tried some Phenergan  at home with no relief in symptoms.  She called her primary care doctor earlier today who recommended she get evaluated in the emergency department.  Patient states pain was initially generalized throughout her abdomen but has now localized to the right lower quadrant.  She denies emesis, chest pain, fever.  Of note she was febrile with a temperature of 100 F during a vitals check.  HPI     Home Medications Prior to Admission medications   Medication Sig Start Date End Date Taking? Authorizing Provider  acetaminophen  (TYLENOL ) 325 MG tablet Take 650 mg by mouth every 6 (six) hours as needed for moderate pain.    [provider]  albuterol  (VENTOLIN  HFA) 108 (90 Base) MCG/ACT inhaler Inhale 1-2 puffs into the lungs every 6 (six) hours as needed for wheezing or shortness of breath. 01/06/22   Reford Canterbury, MD  ASPIRIN  LOW DOSE 81 MG EC tablet TAKE ONE TABLET BY MOUTH DAILY 12/09/20   Reford Canterbury, MD  atorvastatin  (LIPITOR) 10 MG tablet Take 1 tablet (10 mg total) by mouth every evening. 06/09/23   Walsh, Tanya, MD  clindamycin (CLINDAGEL) 1 % gel Apply 1 application  topically daily as needed (For lips).    [provider]  Cyanocobalamin  (VITAMIN B-12) 1000 MCG SUBL Place 1 tablet (1,000 mcg total) under the tongue daily. Patient not taking: Reported on 10/09/2023 01/16/23   Valli Gaw, MD  divalproex  (DEPAKOTE  ER) 250 MG 24 hr tablet Take 1 tablet (250 mg total) by mouth  daily. 11/28/23 02/26/24  Shery Done, MD  ipratropium (ATROVENT) 0.06 % nasal spray Place 1 spray into both nostrils daily as needed for rhinitis.    [provider]  Multiple Vitamin (MULTIVITAMIN) capsule Take 1 capsule by mouth daily.    [provider]  oxybutynin (DITROPAN-XL) 5 MG 24 hr tablet Take 5 mg by mouth at bedtime.    [provider]  PARoxetine  (PAXIL ) 40 MG tablet Take 1.5 tablets (60 mg total) by mouth every morning. 12/13/23   Shery Done, MD  Probiotic Product (UP4 PROBIOTICS ADULT PO) Take by mouth daily.    [provider]  promethazine  (PHENERGAN ) 25 MG tablet Take 1 tablet (25 mg total) by mouth every 6 (six) hours as needed for nausea or vomiting. Patient not taking: Reported on 10/09/2023 01/24/22   Floyd, Dan, DO  propranolol  (INDERAL ) 10 MG tablet Take 1 tablet (10 mg total) by mouth 2 (two) times daily as needed (Anxiety). 12/13/23 03/12/24  Shery Done, MD  sodium fluoride (FLUORISHIELD) 1.1 % GEL dental gel Place 1 application  onto teeth every other day. Patient not taking: Reported on 10/09/2023    [provider]  valACYclovir (VALTREX) 1000 MG tablet Take 1,000 mg by mouth daily as needed (for fever blisters). 08/24/17   [provider]  vancomycin  (VANCOCIN ) 125 MG capsule Take 1 capsule (125 mg total) by mouth daily. Patient not taking: Reported on 10/09/2023 03/13/23   Luke Salaam, MD  Allergies    Ceftriaxone , Nitrofurantoin monohyd macro, and Sulfa antibiotics    Review of Systems   Review of Systems  Physical Exam Updated Vital Signs BP 108/64 (BP Location: Right Arm)   Pulse 81   Temp 98.5 F (36.9 C) (Oral)   Resp (!) 28   SpO2 93%  Physical Exam Vitals and nursing note reviewed.  Constitutional:      General: She is not in acute distress.    Appearance: She is well-developed.  HENT:     Head: Normocephalic and atraumatic.  Eyes:     Conjunctiva/sclera: Conjunctivae normal.   Cardiovascular:     Rate and Rhythm: Normal rate and regular rhythm.     Heart sounds: No murmur heard. Pulmonary:     Effort: Pulmonary effort is normal. No respiratory distress.     Breath sounds: Normal breath sounds.  Abdominal:     Palpations: Abdomen is soft.     Tenderness: There is abdominal tenderness.     Comments: Right lower quadrant tenderness with guarding  Musculoskeletal:        General: No swelling.     Cervical back: Neck supple.  Skin:    General: Skin is warm and dry.     Capillary Refill: Capillary refill takes less than 2 seconds.  Neurological:     Mental Status: She is alert.  Psychiatric:        Mood and Affect: Mood normal.     ED Results / Procedures / Treatments   Labs (all labs ordered are listed, but only abnormal results are displayed) Labs Reviewed  COMPREHENSIVE METABOLIC PANEL WITH GFR - Abnormal; Notable for the following components:      Result Value   CO2 15 (*)    Glucose, Bld 149 (*)    Calcium  8.6 (*)    Anion gap 16 (*)    All other components within normal limits  CBC - Abnormal; Notable for the following components:   WBC 12.5 (*)    RBC 5.98 (*)    Hemoglobin 17.5 (*)    HCT 52.4 (*)    All other components within normal limits  URINALYSIS, ROUTINE W REFLEX MICROSCOPIC - Abnormal; Notable for the following components:   APPearance HAZY (*)    Specific Gravity, Urine >1.046 (*)    Hgb urine dipstick SMALL (*)    Bacteria, UA RARE (*)    All other components within normal limits  LIPASE, BLOOD    EKG None  Radiology CT ABDOMEN PELVIS W CONTRAST Result Date: 01/08/2024 CLINICAL DATA:  Acute abdominal pain.  Flank pain. EXAM: CT ABDOMEN AND PELVIS WITH CONTRAST TECHNIQUE: Multidetector CT imaging of the abdomen and pelvis was performed using the standard protocol following bolus administration of intravenous contrast. RADIATION DOSE REDUCTION: This exam was performed according to the departmental dose-optimization program  which includes automated exposure control, adjustment of the mA and/or kV according to patient size and/or use of iterative reconstruction technique. CONTRAST:  75mL OMNIPAQUE  IOHEXOL  350 MG/ML SOLN COMPARISON:  CT 01/24/2022 FINDINGS: Lower chest: Subsegmental atelectasis in the lower lobes. No pleural effusion. Hepatobiliary: The liver is enlarged spanning 18.7 cm cranial caudal. There is diffuse hepatic steatosis. No evidence of focal liver lesion. Layering density in the gallbladder may represent sludge or stones. No pericholecystic inflammation. No biliary dilatation. Pancreas: Unremarkable. No pancreatic ductal dilatation or surrounding inflammatory changes. Spleen: Normal in size without focal abnormality. Adrenals/Urinary Tract: Multiple punctate right renal calculi. No hydronephrosis. Question of punctate left  upper pole renal calculus, motion obscured. No renal inflammation. Decompressed ureters. Partially distended urinary bladder, normal for degree of distension. Stomach/Bowel: The appendix is dilated measuring 12 mm and fluid-filled, series 2, image 57. Moderate periappendiceal fat stranding and edema. Faint appendicolith at the base of the appendix. No perforation or abscess. Scattered fluid within nondilated small bowel likely reactive. No obstruction. Small volume of formed stool in the colon. 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 This is nonobstructing and of doubtful clinical significance. The stomach is nondistended. Vascular/Lymphatic: Aortic atherosclerosis without aneurysm. Patent portal vein. Few prominent lymph nodes in the ileocolic chain likely reactive. Reproductive: Status post hysterectomy. No adnexal masses. Other: No ascites or free air. Minimal fat containing umbilical hernia. Musculoskeletal: Facet hypertrophy in the lower lumbar spine. There are no acute or suspicious osseous abnormalities. IMPRESSION: 1.  Acute uncomplicated appendicitis. No perforation or abscess. 2. Nonobstructing right renal calculi. Question of punctate left renal calculus. 3. Hepatomegaly with hepatic steatosis. 4. Layering density in the gallbladder may represent sludge or stones. No pericholecystic inflammation. Aortic Atherosclerosis (ICD10-I70.0). Electronically Signed   By: Chadwick Colonel M.D.   On: 01/08/2024 21:10    Procedures Procedures    Medications Ordered in ED Medications  morphine (PF) 4 MG/ML injection 4 mg (has no administration in time range)  ondansetron  (ZOFRAN ) injection 4 mg (has no administration in time range)  metroNIDAZOLE  (FLAGYL ) IVPB 500 mg (has no administration in time range)  piperacillin -tazobactam (ZOSYN ) IVPB 3.375 g (has no administration in time range)  oxyCODONE -acetaminophen  (PERCOCET/ROXICET) 5-325 MG per tablet 1 tablet (1 tablet Oral Given 01/08/24 1858)  ondansetron  (ZOFRAN -ODT) disintegrating tablet 4 mg (4 mg Oral Given 01/08/24 1858)  acetaminophen  (TYLENOL ) tablet 650 mg (650 mg Oral Given 01/08/24 1858)  iohexol  (OMNIPAQUE ) 350 MG/ML injection 75 mL (75 mLs Intravenous Contrast Given 01/08/24 2046)    ED Course/ Medical Decision Making/ A&P                                 Medical Decision Making Amount and/or Complexity of Data Reviewed Labs: ordered.  Risk Prescription drug management.   This patient presents to the ED for concern of abdominal pain, this involves an extensive number of treatment options, and is a complaint that carries with it a high risk of complications and morbidity.  The differential diagnosis includes appendicitis, cholecystitis, pancreatitis, gastroenteritis, others   Co morbidities / Chronic conditions that complicate the patient evaluation  C. difficile   Additional history obtained:  Additional history obtained from EMR External records from outside source obtained and reviewed including Family medicine notes   Lab Tests:  I  Ordered, and personally interpreted labs.  The pertinent results include: Leukocytosis with a white count of 12,500   Imaging Studies ordered:  I ordered imaging studies including CT abdomen pelvis with contrast I independently visualized and interpreted imaging which showed  1. Acute uncomplicated appendicitis. No perforation or abscess.  2. Nonobstructing right renal calculi. Question of punctate left  renal calculus.  3. Hepatomegaly with hepatic steatosis.  4. Layering density in the gallbladder may represent sludge or  stones. No pericholecystic inflammation.    Aortic Atherosclerosis   I agree with the radiologist interpretation   Problem List / ED Course / Critical interventions / Medication management   I ordered medication including Percocet, morphine, Zofran , Cipro , Flagyl , Tylenol  Reevaluation  of the patient after these medicines showed that the patient improved I have reviewed the patients home medicines and have made adjustments as needed   Consultations Obtained:  I requested consultation with the on-call general surgeon, Dr. Davonna Estes,  and discussed lab and imaging findings as well as pertinent plan - they recommend: admit to surgery service with plans for appendectomy   Social Determinants of Health:  Patient is a daily smoker   Test / Admission - Considered:  Patient with acute appendicitis.  Will need evaluation by surgery for possible intervention.         Final Clinical Impression(s) / ED Diagnoses Final diagnoses:  Acute appendicitis, unspecified acute appendicitis type    Rx / DC Orders ED Discharge Orders     None         Delories Fetter 01/09/24 0021    Mesner, Reymundo Caulk, MD 01/09/24 [phone]

## 2024-01-10 ENCOUNTER — Encounter (HOSPITAL_COMMUNITY): Payer: Self-pay | Admitting: General Surgery

## 2024-01-10 ENCOUNTER — Telehealth: Payer: Self-pay

## 2024-01-10 LAB — SURGICAL PATHOLOGY

## 2024-01-10 NOTE — Telephone Encounter (Signed)
 Copied from CRM [id number]. Topic: General - Other >> Jan 10, 2024 10:15 AM Taleah C wrote: Reason for CRM: pt called in to let her PCP know that after she met with the telehealth provider, Nichola Barges, she was referred to go to the hospital and they removed her appendix. She stated she feels better and she's home recovering. She asked for me to send a message. Please advise.

## 2024-01-10 NOTE — Transitions of Care (Post Inpatient/ED Visit) (Signed)
 01/10/2024  Name: Jennifer [name] MRN: [id number] DOB: 1965-08-12  Today's TOC FU Call Status: Today's TOC FU Call Status:: Successful TOC FU Call Completed TOC FU Call Complete Date: 01/10/24 Patient's Name and Date of Birth confirmed.  Transition Care Management Follow-up Telephone Call Date of Discharge: 01/09/24 Discharge Facility: [facility]) Type of Discharge: Inpatient Admission Primary Inpatient Discharge Diagnosis:: appenditis How have you been since you were released from the hospital?: Better Any questions or concerns?: No  Items Reviewed: Did you receive and understand the discharge instructions provided?: Yes Medications obtained,verified, and reconciled?: Yes (Medications Reviewed) Any new allergies since your discharge?: No Dietary orders reviewed?: Yes Do you have support at home?: No  Medications Reviewed Today: Medications Reviewed Today     Reviewed by Darrall Ellison, LPN (Licensed Practical Nurse) on 01/10/24 at 1202  Med List Status: <None>   Medication Order Taking? Sig Documenting Provider Last Dose Status Informant  Acetaminophen  500 MG capsule [id number] No Take 1,000 mg by mouth in the morning. [provider] Past Week Active Self, Pharmacy Records  albuterol  (VENTOLIN  HFA) 108 (90 Base) MCG/ACT inhaler [id number] No Inhale 1-2 puffs into the lungs every 6 (six) hours as needed for wheezing or shortness of breath. Reford Canterbury, MD Past Week Active Self, Pharmacy Records  ASPIRIN  LOW DOSE 81 MG EC tablet [id number] No TAKE ONE TABLET BY MOUTH DAILY Reford Canterbury, MD Past Week Active Self, Pharmacy Records  atorvastatin  (LIPITOR) 10 MG tablet [id number] No Take 1 tablet (10 mg total) by mouth every evening. Valli Gaw, MD Past Week Active Self, Pharmacy Records  cetirizine (ZYRTEC) 10 MG tablet [id number] No Take 10 mg by mouth daily. [provider] Past Week Active Self, Pharmacy Records  ciprofloxacin  (CIPRO ) 500 MG tablet  [id number]  Take 1 tablet (500 mg total) by mouth 2 (two) times daily for 5 days. Ramirez, Armando, MD  Active   clindamycin (CLINDAGEL) 1 % gel [id number] No Apply 1 application  topically daily as needed (For lips). [provider] Unknown Active Self, Pharmacy Records           Med Note (LEE, NICOLE   Tue Jan 09, 2024  6:48 AM) 6 months ago  Cyanocobalamin  (VITAMIN B-12) 1000 MCG SUBL [id number] No Place 1 tablet (1,000 mcg total) under the tongue daily. Valli Gaw, MD Past Week Active Self, Pharmacy Records  divalproex  (DEPAKOTE  ER) 250 MG 24 hr tablet [id number] No Take 1 tablet (250 mg total) by mouth daily. Shery Done, MD Past Week Active Self, Pharmacy Records  ipratropium (ATROVENT) 0.06 % nasal spray [id number] No Place 1 spray into both nostrils daily as needed for rhinitis. [provider] Unknown Active Self, Pharmacy Records           Med Note (LEE, NICOLE   Tue Jan 09, 2024  6:49 AM) 3 months ago  metroNIDAZOLE  (FLAGYL ) 250 MG tablet [id number]  Take 2 tablets (500 mg total) by mouth 2 (two) times daily for 5 days. Shela Derby, MD  Active   oxybutynin (DITROPAN-XL) 5 MG 24 hr tablet [id number] No Take 5 mg by mouth at bedtime. [provider] Past Week Active Self, Pharmacy Records  oxyCODONE -acetaminophen  (PERCOCET) 5-325 MG tablet [id number]  Take 1 tablet by mouth every 4 (four) hours as needed for severe pain (pain score 7-10). Shela Derby, MD  Active   PARoxetine  (PAXIL ) 40 MG tablet [id number] No Take 1.5 tablets (60 mg total) by mouth every morning. Shery Done, MD Past Week  Active Self, Pharmacy Records  Probiotic Product (UP4 PROBIOTICS ADULT PO) [id number] No Take by mouth daily. [provider] Past Week Active Self, Pharmacy Records  promethazine  (PHENERGAN ) 25 MG tablet [id number] No Take 1 tablet (25 mg total) by mouth every 6 (six) hours as needed for nausea or vomiting. Albertus Hughs, DO 01/08/2024 Morning Active Self, Pharmacy  Records  propranolol  (INDERAL ) 10 MG tablet [id number] No Take 1 tablet (10 mg total) by mouth 2 (two) times daily as needed (Anxiety). Shery Done, MD Past Week Active Self, Pharmacy Records  valACYclovir (VALTREX) 1000 MG tablet [id number] No Take 1,000 mg by mouth daily as needed (for fever blisters). [provider] Unknown Active Self, Pharmacy Records           Med Note (LEE, NICOLE   Tue Jan 09, 2024  6:51 AM) 6 months ago            Home Care and Equipment/Supplies: Were Home Health Services Ordered?: NA Any new equipment or medical supplies ordered?: NA  Functional Questionnaire: Do you need assistance with bathing/showering or dressing?: No Do you need assistance with meal preparation?: No Do you need assistance with eating?: No Do you have difficulty maintaining continence: No Do you need assistance with getting out of bed/getting out of a chair/moving?: No Do you have difficulty managing or taking your medications?: No  Follow up appointments reviewed: PCP Follow-up appointment confirmed?: NA Specialist Hospital Follow-up appointment confirmed?: Yes Date of Specialist follow-up appointment?: 02/05/24 Follow-Up Specialty Provider:: surgeon Do you need transportation to your follow-up appointment?: No Do you understand care options if your condition(s) worsen?: Yes-patient verbalized understanding    SIGNATURE Darrall Ellison, LPN [facility] Nurse Health Advisor Direct Dial [phone]

## 2024-01-11 NOTE — Telephone Encounter (Signed)
 Pt was seen in ED.

## 2024-01-12 ENCOUNTER — Telehealth (HOSPITAL_COMMUNITY): Admitting: Student

## 2024-01-12 DIAGNOSIS — Z789 Other specified health status: Secondary | ICD-10-CM

## 2024-01-12 NOTE — Progress Notes (Signed)
 Patient requested a warm-handoff visit with her new provider, Dr. Chien. Dr. Chien was provided with an overview of Jennifer [name]'s health, physical and mental, over the past year that this writer has been working with her and future directions for when she meets Dr. Chien for an in-person visit on 7/16. Patient typically follows up every 2-3 months with this Clinical research associate. Patient was reminded to hold off on Depakote  dose ahead of 7/16 visit, as a VPA level will be drawn at that time.  We did not discuss patient's mental health today, as there were no concerns reported by patient.   Connected via video visit Patient location: Home Physician location: Office  Also present during visit, Dr. Stephanie Chien.  I spent 9 minutes in active handoff to Dr. Chien.   Shery Done, MD PGY-3 01/12/2024  10:35 AM [facility]

## 2024-01-16 NOTE — Discharge Summary (Signed)
 Physician Discharge Summary  Patient ID: Jennifer [name] MRN: [id number] DOB/AGE: [birth year] 58 y.o.  Admit date: 01/08/2024 Discharge date: 01/16/2024  Admission Diagnoses:acute appendicitis  Discharge Diagnoses:  Principal Problem:   Acute appendicitis   Discharged Condition: good  Hospital Course: PT was not admitted to the hospital.  She was seen in ED and was brought to preop for lap appy. Please see op note for full details.  She did well and was Dc'd from PACU  Consults: None  Significant Diagnostic Studies: none  Treatments: surgery: as above  Discharge Exam: Blood pressure 114/74, pulse 95, temperature 98.8 F (37.1 C), resp. rate (!) 26, height 5\' 8"  (1.727 m), weight 99.8 kg, SpO2 92%. PE:  Constitutional: No acute distress, conversant, appears states age. Eyes: Anicteric sclerae, moist conjunctiva, no lid lag Lungs: Clear to auscultation bilaterally, normal respiratory effort CV: regular rate and rhythm, no murmurs, no peripheral edema, pedal pulses 2+ GI: Soft, no masses or hepatosplenomegaly, non-tender to palpation Skin: No rashes, palpation reveals normal turgor Psychiatric: appropriate judgment and insight, oriented to person, place, and time   Disposition: Discharge disposition: 01-Home or Self Care       Discharge Instructions     Diet - low sodium heart healthy   Complete by: As directed    Increase activity slowly   Complete by: As directed       Allergies as of 01/09/2024       Reactions   Ceftriaxone  Rash   Significant full body rash,  Tolerated cefepime  5/22   Nitrofurantoin Monohyd Macro Nausea And Vomiting   Body aches   Sulfa Antibiotics Nausea Only, Rash   Fever and disorientation        Medication List     TAKE these medications    Acetaminophen  500 MG capsule Take 1,000 mg by mouth in the morning.   albuterol  108 (90 Base) MCG/ACT inhaler Commonly known as: VENTOLIN  HFA Inhale 1-2 puffs into the lungs every 6 (six)  hours as needed for wheezing or shortness of breath.   Aspirin  Low Dose 81 MG tablet Generic drug: aspirin  EC TAKE ONE TABLET BY MOUTH DAILY   atorvastatin  10 MG tablet Commonly known as: LIPITOR Take 1 tablet (10 mg total) by mouth every evening.   cetirizine 10 MG tablet Commonly known as: ZYRTEC Take 10 mg by mouth daily.   clindamycin 1 % gel Commonly known as: CLINDAGEL Apply 1 application  topically daily as needed (For lips).   divalproex  250 MG 24 hr tablet Commonly known as: Depakote  ER Take 1 tablet (250 mg total) by mouth daily.   ipratropium 0.06 % nasal spray Commonly known as: ATROVENT Place 1 spray into both nostrils daily as needed for rhinitis.   oxybutynin 5 MG 24 hr tablet Commonly known as: DITROPAN-XL Take 5 mg by mouth at bedtime.   oxyCODONE -acetaminophen  5-325 MG tablet Commonly known as: Percocet Take 1 tablet by mouth every 4 (four) hours as needed for severe pain (pain score 7-10).   PARoxetine  40 MG tablet Commonly known as: PAXIL  Take 1.5 tablets (60 mg total) by mouth every morning.   promethazine  25 MG tablet Commonly known as: PHENERGAN  Take 1 tablet (25 mg total) by mouth every 6 (six) hours as needed for nausea or vomiting.   propranolol  10 MG tablet Commonly known as: INDERAL  Take 1 tablet (10 mg total) by mouth 2 (two) times daily as needed (Anxiety).   UP4 PROBIOTICS ADULT PO Take by mouth daily.   valACYclovir  1000 MG tablet Commonly known as: VALTREX Take 1,000 mg by mouth daily as needed (for fever blisters).   Vitamin B-12 1000 MCG Subl Place 1 tablet (1,000 mcg total) under the tongue daily.       ASK your doctor about these medications    ciprofloxacin  500 MG tablet Commonly known as: Cipro  Take 1 tablet (500 mg total) by mouth 2 (two) times daily for 5 days. Ask about: Should I take this medication?   metroNIDAZOLE  250 MG tablet Commonly known as: FLAGYL  Take 2 tablets (500 mg total) by mouth 2 (two) times  daily for 5 days. Ask about: Should I take this medication?        Follow-up Information     Maczis, Puja Gosai, PA-C. Go on 02/05/2024.   Specialty: General Surgery Why: 10:40 AM, please arrive 30 min prior to appointment time to check in. Contact information: [address] [facility] [place] [address] [phone]                 Signed: Shela Derby 01/16/2024, 10:27 AM

## 2024-01-18 ENCOUNTER — Ambulatory Visit: Admitting: Family Medicine

## 2024-01-18 ENCOUNTER — Encounter: Payer: Self-pay | Admitting: Family Medicine

## 2024-01-18 ENCOUNTER — Other Ambulatory Visit
Admission: RE | Admit: 2024-01-18 | Discharge: 2024-01-18 | Disposition: A | Discharge status: Home / Self Care | Source: Ambulatory Visit | Attending: Family Medicine | Admitting: Family Medicine

## 2024-01-18 ENCOUNTER — Ambulatory Visit: Payer: Self-pay | Admitting: Family Medicine

## 2024-01-18 VITALS — BP 110/62 | HR 96 | Temp 98.2°F | Resp 20 | Ht 68.0 in | Wt 217.5 lb

## 2024-01-18 DIAGNOSIS — Z9049 Acquired absence of other specified parts of digestive tract: Secondary | ICD-10-CM | POA: Diagnosis not present

## 2024-01-18 DIAGNOSIS — R197 Diarrhea, unspecified: Secondary | ICD-10-CM | POA: Insufficient documentation

## 2024-01-18 DIAGNOSIS — A0472 Enterocolitis due to Clostridium difficile, not specified as recurrent: Secondary | ICD-10-CM

## 2024-01-18 LAB — C DIFFICILE QUICK SCREEN W PCR REFLEX
C Diff antigen: POSITIVE — AB
C Diff toxin: NEGATIVE

## 2024-01-18 LAB — CLOSTRIDIUM DIFFICILE BY PCR, REFLEXED: Toxigenic C. Difficile by PCR: POSITIVE — AB

## 2024-01-18 MED ORDER — VANCOMYCIN HCL 125 MG PO CAPS: 125.0000 mg | ORAL_CAPSULE | Freq: Four times a day (QID) | ORAL | 0 refills | Status: AC

## 2024-01-18 NOTE — Assessment & Plan Note (Signed)
 Persistent watery diarrhea post-appendectomy and antibiotics, likely antibiotic-associated or C. diff. 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 Testing needed. Recently completed 5 day course of Cipro  and Flagyl  2 days ago.  Treated s/p appendectomy 01/08/24 - Order stool sample to test for C. diff. - Order blood work to assess for dehydration and infection. - Advise hydration. - Instruct to contact surgeon regarding post-surgical symptoms. - Advise follow-up with GI specialist.

## 2024-01-18 NOTE — Patient Instructions (Addendum)
 It was a pleasure meeting you today. Thank you for allowing me to take part in your health care.  Our goals for today as we discussed include:  We will get some labs today.  If they are abnormal or we need to do something about them, I will call you.  If they are normal, I will send you a message on MyChart (if it is active) or a letter in the mail.  If you don't hear from us  in 2 weeks, please call the office at the number below.   Collect stool and bring to [facility] lab.  Call your Surgeon today to let them know about your symptoms.    Maczis, Puja Gosai, PA-C. Go on 02/05/2024.   Specialty: General Surgery Why: 10:40 AM, please arrive 30 min prior to appointment time to check in. Contact information: [address] [facility] [place] [address] [phone]    This is a list of the screening recommended for you and due dates:  Health Maintenance  Topic Date Due   Pneumococcal Vaccination (2 of 2 - PCV) 02/28/2008   Pap with HPV screening  08/04/2022   Mammogram  09/02/2022   COVID-19 Vaccine (5 - 2024-25 season) 04/09/2023   Flu Shot  03/08/2024   DTaP/Tdap/Td vaccine (4 - Td or Tdap) 05/26/2024   Colon Cancer Screening  12/29/2027   Hepatitis C Screening  Completed   HIV Screening  Completed   Zoster (Shingles) Vaccine  Completed   HPV Vaccine  Aged Out   Meningitis B Vaccine  Aged Out    If you have any questions or concerns, please do not hesitate to call the office at [phone].  I look forward to our next visit and until then take care and stay safe.  Regards,   Valli Gaw, MD   [facility]

## 2024-01-18 NOTE — Assessment & Plan Note (Signed)
 Crampy abdominal pain and diarrhea post-surgery may indicate complications. Surgical consultation needed. Recently completed antibiotics.  Afebrile - Instruct to contact surgeon to discuss symptoms

## 2024-01-18 NOTE — Progress Notes (Signed)
 SUBJECTIVE:   Chief Complaint  Patient presents with   Diarrhea    11 days   HPI Presents for acute visit  Discussed the use of AI scribe software for clinical note transcription with the patient, who gave verbal consent to proceed.  History of Present Illness Discussed the use of AI scribe software for clinical note transcription with the patient, who gave verbal consent to proceed.  History of Present Illness   Jennifer [name] is a 58 year old female with a history of C. difficile infection who presents with diarrhea following recent appendectomy and antibiotic use.  She has been experiencing diarrhea since Sunday, following an appendectomy performed on January 09, 2024, at [facility]. She was discharged the same day and was on antibiotics until two days ago. The diarrhea is described as very watery, occurring approximately ten times a day, particularly after eating. She notes the presence of particles in the stool but denies any blood or mucus.  Associated symptoms include stomach aches, sweating, and increased sensitivity in the vaginal area. She feels clammy and a little sweaty but denies having a fever. She is attempting to stay hydrated despite the frequent diarrhea.  Her past medical history is significant for a previous C. difficile infection a couple of years ago. She is concerned about the possibility of a recurrent C. difficile infection due to her recent antibiotic use.  She has not yet consulted her surgeon since the onset of these symptoms. She is scheduled for a follow-up appointment on February 05, 2024.        PERTINENT PMH / PSH: As above  OBJECTIVE:  BP 110/62   Pulse 96   Temp 98.2 F (36.8 C)   Resp 20   Ht 5' 8 (1.727 m)   Wt 217 lb 8 oz (98.7 kg)   SpO2 98%   BMI 33.07 kg/m    Physical Exam Vitals reviewed.  Constitutional:      General: She is not in acute distress.    Appearance: Normal appearance. She is normal weight. She is not  ill-appearing, toxic-appearing or diaphoretic.   Eyes:     General:        Right eye: No discharge.        Left eye: No discharge.     Conjunctiva/sclera: Conjunctivae normal.    Cardiovascular:     Rate and Rhythm: Normal rate and regular rhythm.     Heart sounds: Normal heart sounds.  Pulmonary:     Effort: Pulmonary effort is normal.     Breath sounds: Normal breath sounds.  Abdominal:     General: Bowel sounds are normal.     Tenderness: There is abdominal tenderness. There is no guarding.   Musculoskeletal:        General: Normal range of motion.   Skin:    General: Skin is warm and dry.   Neurological:     General: No focal deficit present.     Mental Status: She is alert and oriented to person, place, and time. Mental status is at baseline.   Psychiatric:        Mood and Affect: Mood normal.        Behavior: Behavior normal.        Thought Content: Thought content normal.        Judgment: Judgment normal.           04/05/2023   12:00 PM 03/14/2023    2:56 PM 01/19/2023  4:14 PM 01/10/2023    3:17 PM 12/01/2022    4:06 PM  Depression screen PHQ 2/9  Decreased Interest 1 1  0   Down, Depressed, Hopeless 1 1  1    PHQ - 2 Score 2 2  1    Altered sleeping 1 0  0   Tired, decreased energy 3 3  1    Change in appetite 0 0  0   Feeling bad or failure about yourself  1 0  0   Trouble concentrating 0 0  0   Moving slowly or fidgety/restless 0 0  0   Suicidal thoughts 0 0  0   PHQ-9 Score 7 5  2    Difficult doing work/chores Somewhat difficult Not difficult at all  Not difficult at all      Information is confidential and restricted. Go to Review Flowsheets to unlock data.      04/05/2023   12:00 PM 03/14/2023    2:56 PM 01/10/2023    3:15 PM 02/24/2022   11:08 AM  GAD 7 : Generalized Anxiety Score  Nervous, Anxious, on Edge 1 1 1 3   Control/stop worrying 1 2 1 2   Worry too much - different things 1 2 1 2   Trouble relaxing 1 1 1 1   Restless 1 0 1 1  Easily  annoyed or irritable 1 0 0 0  Afraid - awful might happen 1 0 0 1  Total GAD 7 Score 7 6 5 10   Anxiety Difficulty Somewhat difficult Somewhat difficult Not difficult at all     ASSESSMENT/PLAN:  Diarrhea, unspecified type Assessment & Plan: Persistent watery diarrhea post-appendectomy and antibiotics, likely antibiotic-associated or C. diff. 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 Testing needed. Recently completed 5 day course of Cipro  and Flagyl  2 days ago.  Treated s/p appendectomy 01/08/24 - Order stool sample to test for C. diff. - Order blood work to assess for dehydration and infection. - Advise hydration. - Instruct to contact surgeon regarding post-surgical symptoms. - Advise follow-up with GI specialist.  Orders: -     CBC with Differential/Platelet -     C Difficile Quick Screen w PCR reflex -     Comprehensive metabolic panel with GFR  S/P appendectomy Assessment & Plan: Crampy abdominal pain and diarrhea post-surgery may indicate complications. Surgical consultation needed. Recently completed antibiotics.  Afebrile - Instruct to contact surgeon to discuss symptoms       PDMP reviewed  Return if symptoms worsen or fail to improve, for PCP.  Valli Gaw, MD

## 2024-01-19 ENCOUNTER — Ambulatory Visit: Payer: Self-pay | Admitting: Family Medicine

## 2024-01-19 ENCOUNTER — Telehealth: Payer: Self-pay

## 2024-01-19 LAB — COMPREHENSIVE METABOLIC PANEL WITH GFR
ALT: 19 IU/L (ref 0–32)
AST: 22 IU/L (ref 0–40)
Albumin: 4.3 g/dL (ref 3.8–4.9)
Alkaline Phosphatase: 96 IU/L (ref 44–121)
BUN/Creatinine Ratio: 12 (ref 12–28)
BUN: 9 mg/dL (ref 8–27)
Bilirubin Total: 0.4 mg/dL (ref 0.0–1.2)
CO2: 18 mmol/L — ABNORMAL LOW (ref 20–29)
Calcium: 9.3 mg/dL (ref 8.7–10.3)
Chloride: 102 mmol/L (ref 96–106)
Creatinine, Ser: 0.76 mg/dL (ref 0.57–1.00)
Globulin, Total: 2.3 g/dL (ref 1.5–4.5)
Glucose: 108 mg/dL — ABNORMAL HIGH (ref 70–99)
Potassium: 3.8 mmol/L (ref 3.5–5.2)
Sodium: 140 mmol/L (ref 134–144)
Total Protein: 6.6 g/dL (ref 6.0–8.5)
eGFR: 90 mL/min/{1.73_m2} (ref >59)

## 2024-01-19 LAB — CBC WITH DIFFERENTIAL/PLATELET
Basophils Absolute: 0.1 10*3/uL (ref 0.0–0.2)
Basos: 1 %
EOS (ABSOLUTE): 0.2 10*3/uL (ref 0.0–0.4)
Eos: 2 %
Hematocrit: 51.5 % — ABNORMAL HIGH (ref 34.0–46.6)
Hemoglobin: 16.8 g/dL — ABNORMAL HIGH (ref 11.1–15.9)
Immature Grans (Abs): 0.3 10*3/uL — ABNORMAL HIGH (ref 0.0–0.1)
Immature Granulocytes: 2 %
Lymphocytes Absolute: 4 10*3/uL — ABNORMAL HIGH (ref 0.7–3.1)
Lymphs: 25 %
MCH: 29.3 pg (ref 26.6–33.0)
MCHC: 32.6 g/dL (ref 31.5–35.7)
MCV: 90 fL (ref 79–97)
Monocytes Absolute: 0.6 10*3/uL (ref 0.1–0.9)
Monocytes: 4 %
Neutrophils Absolute: 10.7 10*3/uL — ABNORMAL HIGH (ref 1.4–7.0)
Neutrophils: 66 %
Platelets: 387 10*3/uL (ref 150–450)
RBC: 5.73 x10E6/uL — ABNORMAL HIGH (ref 3.77–5.28)
RDW: 13.9 % (ref 11.7–15.4)
WBC: 15.9 10*3/uL — ABNORMAL HIGH (ref 3.4–10.8)

## 2024-01-19 NOTE — Telephone Encounter (Signed)
 Copied from CRM [id number]. Topic: General - Other >> Jan 19, 2024 11:20 AM Adonis Hoot wrote: Reason for CRM: Patient called in to let Dr Sueanne Emerald know that she is feeling much better.
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
## 2024-02-21 ENCOUNTER — Ambulatory Visit (INDEPENDENT_AMBULATORY_CARE_PROVIDER_SITE_OTHER): Admitting: Psychiatry

## 2024-02-21 VITALS — BP 119/79 | HR 72 | Wt 224.0 lb

## 2024-02-21 DIAGNOSIS — F331 Major depressive disorder, recurrent, moderate: Secondary | ICD-10-CM

## 2024-02-21 DIAGNOSIS — F3340 Major depressive disorder, recurrent, in remission, unspecified: Secondary | ICD-10-CM | POA: Diagnosis not present

## 2024-02-21 DIAGNOSIS — F4310 Post-traumatic stress disorder, unspecified: Secondary | ICD-10-CM | POA: Diagnosis not present

## 2024-02-21 DIAGNOSIS — F411 Generalized anxiety disorder: Secondary | ICD-10-CM | POA: Diagnosis not present

## 2024-02-21 MED ORDER — DIVALPROEX SODIUM ER 250 MG PO TB24: 250.0000 mg | ORAL_TABLET | Freq: Every day | ORAL | 2 refills | Status: DC

## 2024-02-21 MED ORDER — PROPRANOLOL HCL 10 MG PO TABS: 10.0000 mg | ORAL_TABLET | Freq: Two times a day (BID) | ORAL | 2 refills | Status: DC

## 2024-02-21 MED ORDER — PAROXETINE HCL 40 MG PO TABS: 60.0000 mg | ORAL_TABLET | ORAL | 2 refills | Status: DC

## 2024-02-21 NOTE — Addendum Note (Signed)
 Addended by: GRAHAM KRABBE on: 02/21/2024 04:55 PM   Modules accepted: Level of Service

## 2024-02-22 ENCOUNTER — Ambulatory Visit (HOSPITAL_COMMUNITY): Payer: Self-pay | Admitting: Psychiatry

## 2024-02-22 LAB — VALPROIC ACID LEVEL: Valproic Acid Lvl: 23 ug/mL — ABNORMAL LOW (ref 50–100)

## 2024-04-01 ENCOUNTER — Telehealth: Payer: Self-pay | Admitting: Internal Medicine

## 2024-04-01 IMAGING — CT CT RENAL STONE PROTOCOL
2 of 4 series · 16 of 46 positions shown, 18 images · non-contrast
Comparison: CT Abdomen and Pelvis with contrast 12/26/2021 and
earlier.

CLINICAL DATA: 56-year-old female with weakness, nausea, diarrhea,
abdominal pain.



[Series 2: axial st · axial · 0.98mm/px · z∈[+1081,+1486]mm · 13 of 95 slices shown, 15 images]
[im 7/95  soft-tissue]
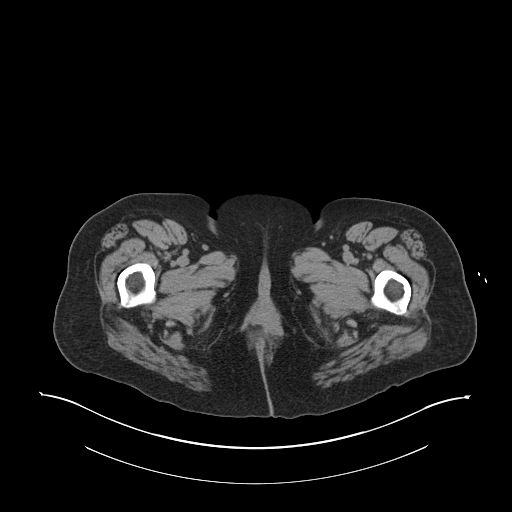
[im 7/95  bone]
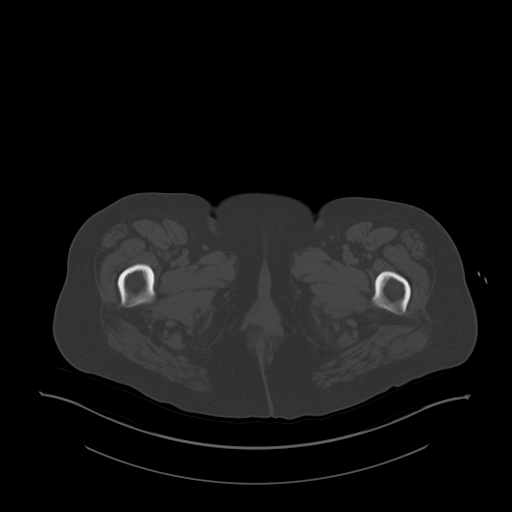
[im 13/95  soft-tissue]
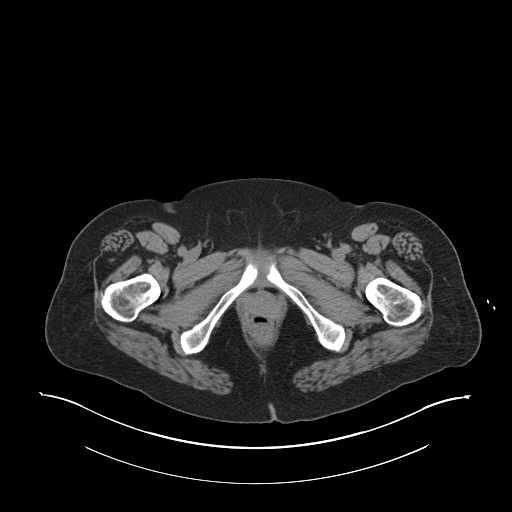
[im 19/95  soft-tissue]
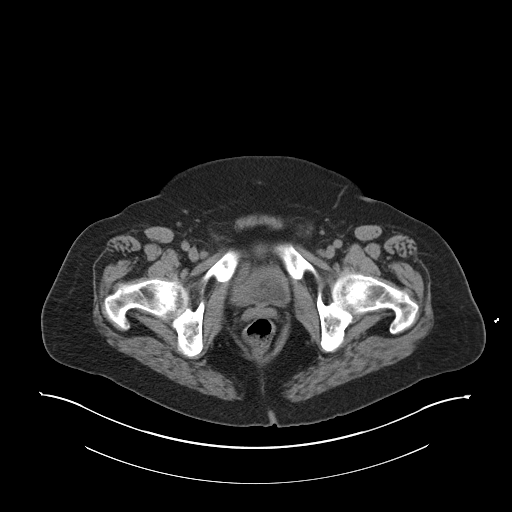
[im 26/95  soft-tissue]
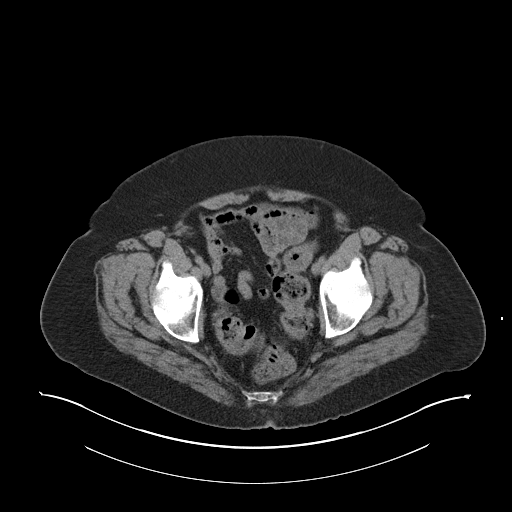
[im 32/95  soft-tissue]
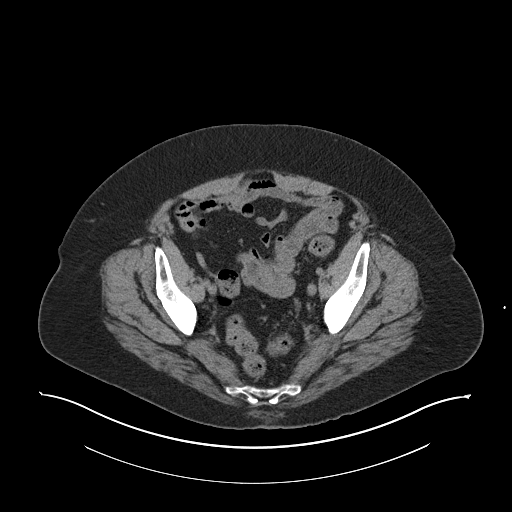
[im 38/95  soft-tissue]
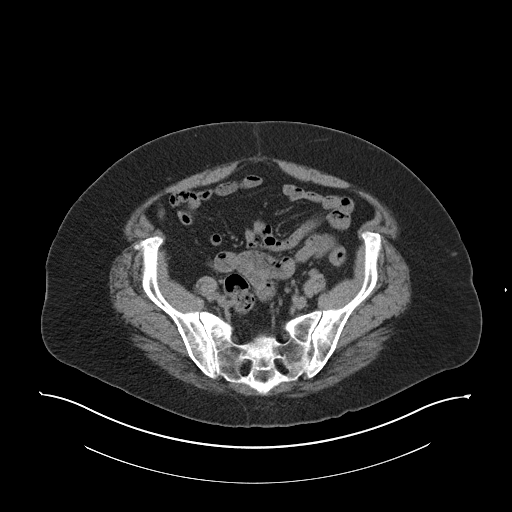
[im 51/95  soft-tissue]
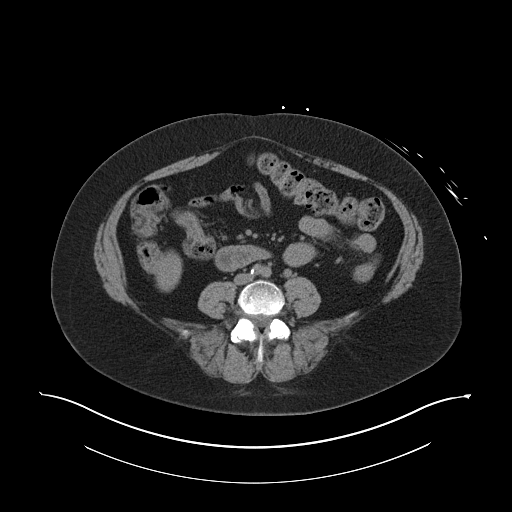
[im 57/95  soft-tissue]
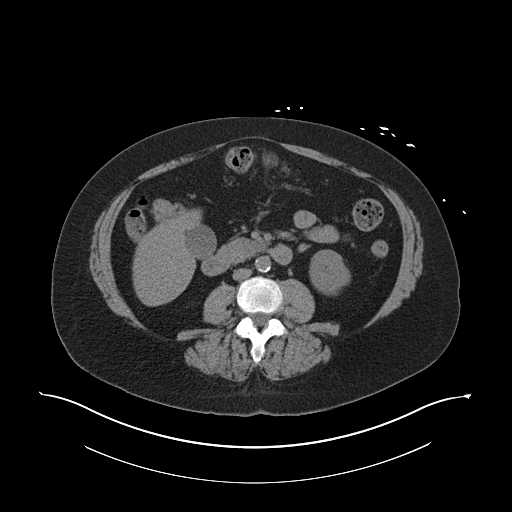
[im 63/95  soft-tissue]
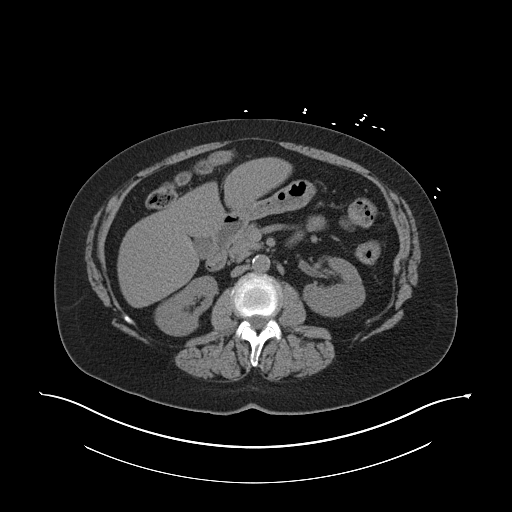
[im 63/95  bone]
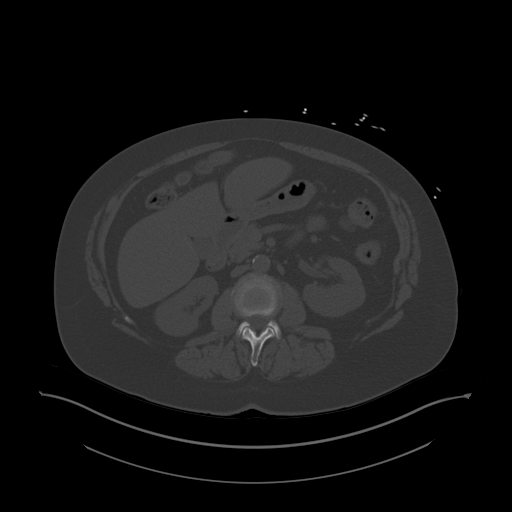
[im 69/95  soft-tissue]
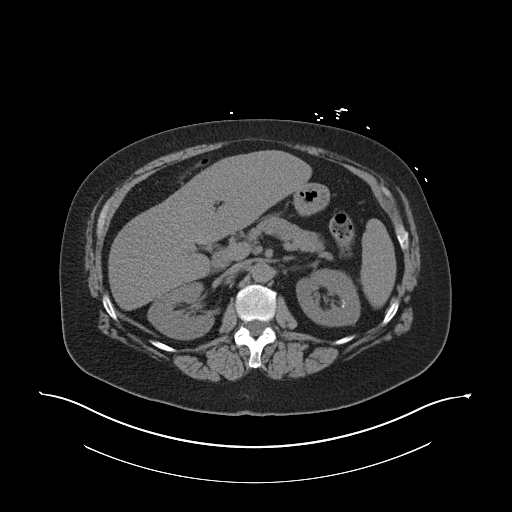
[im 76/95  soft-tissue]
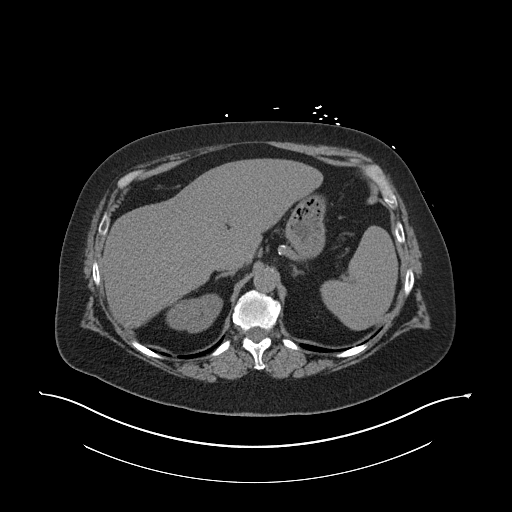
[im 82/95  soft-tissue]
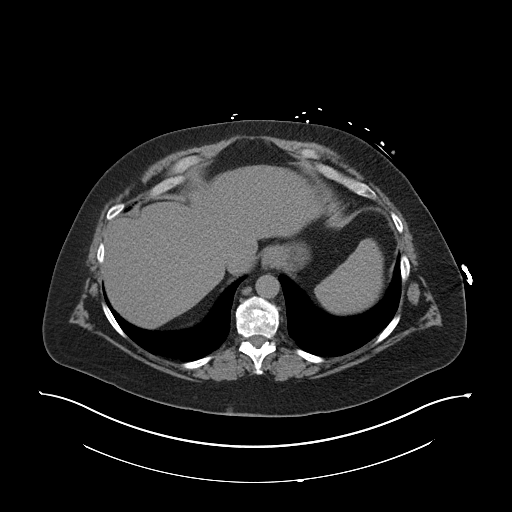
[im 88/95  soft-tissue]
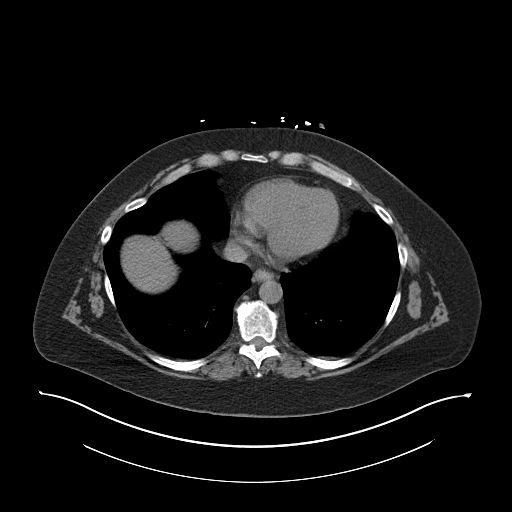

[Series 4: coronal · coronal · 0.91mm/px · 3 of 157 slices shown]
[im 53/157  soft-tissue]
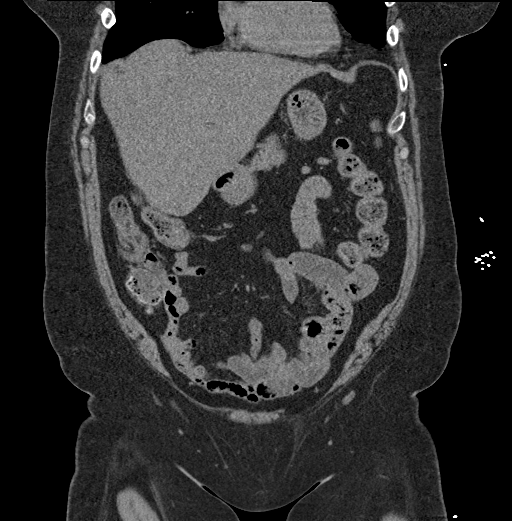
[im 70/157  soft-tissue]
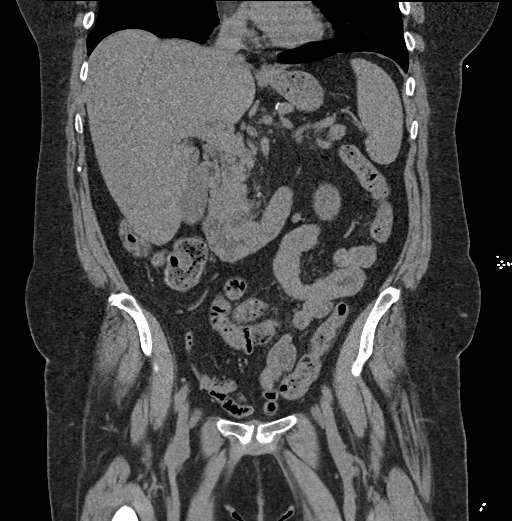
[im 87/157  soft-tissue]
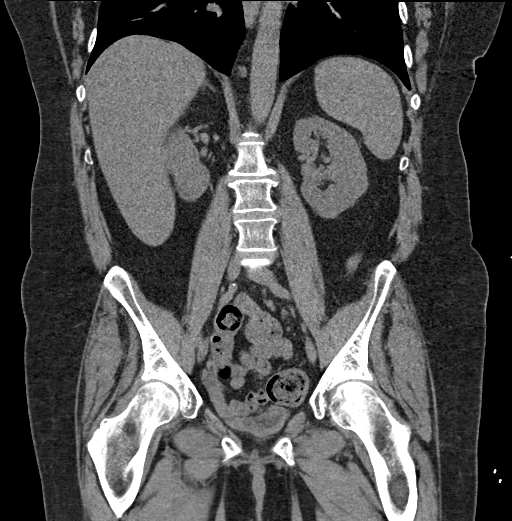

[16 of 46 positions shown; findings below may reference images not displayed]

FINDINGS: Lower chest: Improved lung volumes and lung base ventilation. Mild
residual scarring and/or atelectasis. No cardiomegaly, pericardial
effusion, pleural effusion.

Hepatobiliary: Negative noncontrast liver and gallbladder except for
mild fatty sparing adjacent to the gallbladder fossa.

Pancreas: Negative noncontrast pancreas.

Spleen: Negative.

Adrenals/Urinary Tract: Normal adrenal glands. Extensive bilateral
nephrolithiasis, mostly punctate calculi. No convincing
hydronephrosis or pararenal inflammation. And both ureters are
decompressed to the bladder. There is mild asymmetric left ureter
urothelial thickening, but no convincing Peri ureteral inflammation.

Decompressed and negative bladder.

Stomach/Bowel: Redundant distal large bowel with retained stool.
Mild if any diverticular disease. Similar retained stool in the
proximal colon. No active large bowel inflammation. Normal appendix
adjacent to the cecum on series 4, image 47. Decompressed terminal
ileum. No dilated small bowel. Decompressed stomach. No free air or
free fluid.

Vascular/Lymphatic: Aortoiliac calcified atherosclerosis. Normal
caliber abdominal aorta. No lymphadenopathy identified.

Reproductive: Surgically absent uterus. Diminutive or absent
ovaries.

Other: No pelvic free fluid.  Small pelvic phleboliths.

Musculoskeletal: Osteopenia. No acute osseous abnormality
identified.
IMPRESSION: 1. Extensive bilateral nephrolithiasis. No ureteral calculus or
obstructive uropathy.
2. No acute or inflammatory process identified in the noncontrast
abdomen or pelvis.
3. Aortic Atherosclerosis ([id number]).

## 2024-04-01 NOTE — Telephone Encounter (Signed)
 Pt called to cancel appts on 05/01/24, does not want to r/s appts. states she is good right now and can't afford the copay. Appts are canceled
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
## 2024-04-15 ENCOUNTER — Encounter (HOSPITAL_COMMUNITY): Payer: Self-pay | Admitting: Psychiatry

## 2024-04-15 ENCOUNTER — Ambulatory Visit (HOSPITAL_COMMUNITY): Admitting: Psychiatry

## 2024-04-15 DIAGNOSIS — F4310 Post-traumatic stress disorder, unspecified: Secondary | ICD-10-CM

## 2024-04-15 DIAGNOSIS — F3340 Major depressive disorder, recurrent, in remission, unspecified: Secondary | ICD-10-CM

## 2024-04-15 DIAGNOSIS — F411 Generalized anxiety disorder: Secondary | ICD-10-CM | POA: Diagnosis not present

## 2024-04-15 MED ORDER — DIVALPROEX SODIUM ER 250 MG PO TB24: 250.0000 mg | ORAL_TABLET | Freq: Every day | ORAL | 2 refills | Status: DC

## 2024-04-15 MED ORDER — PROPRANOLOL HCL 10 MG PO TABS: 10.0000 mg | ORAL_TABLET | Freq: Two times a day (BID) | ORAL | 2 refills | Status: DC

## 2024-04-15 MED ORDER — PAROXETINE HCL 40 MG PO TABS: 60.0000 mg | ORAL_TABLET | ORAL | 2 refills | Status: DC

## 2024-04-16 ENCOUNTER — Other Ambulatory Visit (HOSPITAL_COMMUNITY)
Admission: RE | Admit: 2024-04-16 | Discharge: 2024-04-16 | Disposition: A | Discharge status: Home / Self Care | Source: Ambulatory Visit | Attending: Nurse Practitioner | Admitting: Nurse Practitioner

## 2024-04-16 ENCOUNTER — Ambulatory Visit: Admitting: Nurse Practitioner

## 2024-04-16 VITALS — BP 120/76 | HR 74 | Temp 98.3°F | Ht 68.0 in | Wt 226.8 lb

## 2024-04-16 DIAGNOSIS — E785 Hyperlipidemia, unspecified: Secondary | ICD-10-CM

## 2024-04-16 DIAGNOSIS — E559 Vitamin D deficiency, unspecified: Secondary | ICD-10-CM | POA: Diagnosis not present

## 2024-04-16 DIAGNOSIS — Z1151 Encounter for screening for human papillomavirus (HPV): Secondary | ICD-10-CM | POA: Diagnosis not present

## 2024-04-16 DIAGNOSIS — Z1329 Encounter for screening for other suspected endocrine disorder: Secondary | ICD-10-CM

## 2024-04-16 DIAGNOSIS — Z1231 Encounter for screening mammogram for malignant neoplasm of breast: Secondary | ICD-10-CM

## 2024-04-16 DIAGNOSIS — Z124 Encounter for screening for malignant neoplasm of cervix: Secondary | ICD-10-CM

## 2024-04-16 DIAGNOSIS — Z01419 Encounter for gynecological examination (general) (routine) without abnormal findings: Secondary | ICD-10-CM | POA: Diagnosis not present

## 2024-04-16 DIAGNOSIS — R350 Frequency of micturition: Secondary | ICD-10-CM

## 2024-04-16 DIAGNOSIS — J4520 Mild intermittent asthma, uncomplicated: Secondary | ICD-10-CM | POA: Diagnosis not present

## 2024-04-16 MED ORDER — OXYBUTYNIN CHLORIDE ER 5 MG PO TB24: 5.0000 mg | ORAL_TABLET | Freq: Every day | ORAL | 3 refills | Status: AC

## 2024-04-16 MED ORDER — ATORVASTATIN CALCIUM 10 MG PO TABS: 10.0000 mg | ORAL_TABLET | Freq: Every evening | ORAL | 3 refills | Status: AC

## 2024-04-16 MED ORDER — ALBUTEROL SULFATE HFA 108 (90 BASE) MCG/ACT IN AERS: 1.0000 | INHALATION_SPRAY | Freq: Four times a day (QID) | RESPIRATORY_TRACT | 2 refills | Status: AC | PRN

## 2024-04-16 NOTE — Addendum Note (Signed)
 Addended by: CARVIN CROCK on: 04/16/2024 08:23 AM   Modules accepted: Level of Service

## 2024-04-16 NOTE — Progress Notes (Signed)
 Leron Glance, NP-C Phone: [phone]  Jennifer [name] is a 58 y.o. female who presents today for transfer of care and annual exam.   Discussed the use of AI scribe software for clinical note transcription with the patient, who gave verbal consent to proceed.  History of Present Illness   Jennifer [name] is a 58 year old female with recurrent C. difficile infections who presents for a transfer of care and annual exam.  She has a history of recurrent Clostridioides difficile infections, initially contracted following a kidney stone that led to sepsis and a subsequent ICU stay. 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 She has had recurrences of C. diff following antibiotic use, including after an appendectomy in June. She avoids antibiotics due to this susceptibility.  She is currently on Lipitor for cholesterol management and uses an Albuterol  inhaler as needed. She also takes oxybutynin  for bladder issues, which she previously managed with her gynecologist but now seeks to have managed here. She has a history of osteopenia and does not currently take vitamin D  supplements.  She has a history of abnormal Pap smears in her twenties but none recently. She has had a hysterectomy, retaining one ovary and her cervix.  No family history of breast, ovarian, or colon cancer. She smokes about half a pack per day, does not consume alcohol or drugs, and describes herself as very active, engaging in daily activities such as working and home maintenance. Her diet includes breakfast and dinner, with a tendency towards sweets and soda consumption.  No chest pain, shortness of breath, or abdominal pain currently. She experiences occasional migraines, described as 'squiggly things' in her vision, which resolve with rest or Tylenol . She sees a psychiatrist for mood, anxiety, and depression and takes Paxil . She reports good sleep quality but feels she does not get enough sleep.       Social History   Tobacco Use  Smoking Status Every Day   Current packs/day: 0.25   Average packs/day: 0.3 packs/day for 30.0 years (7.5 ttl pk-yrs)   Types: Cigarettes  Smokeless Tobacco Never    Current Outpatient Medications on File Prior to Visit  Medication Sig Dispense Refill   Acetaminophen  500 MG capsule Take 1,000 mg by mouth in the morning.     ASPIRIN  LOW DOSE 81 MG EC tablet TAKE ONE TABLET BY MOUTH DAILY 90 tablet 3   cetirizine (ZYRTEC) 10 MG tablet Take 10 mg by mouth daily.     clindamycin (CLINDAGEL) 1 % gel Apply 1 application  topically daily as needed (For lips).     divalproex  (DEPAKOTE  ER) 250 MG 24 hr tablet Take 1 tablet (250 mg total) by mouth daily. 30 tablet 2   ipratropium (ATROVENT) 0.06 % nasal spray Place 1 spray into both nostrils daily as needed for rhinitis.     PARoxetine  (PAXIL ) 40 MG tablet Take 1.5 tablets (60 mg total) by mouth every morning. 45 tablet 2   Probiotic Product (UP4 PROBIOTICS ADULT PO) Take by mouth daily.     promethazine  (PHENERGAN ) 25 MG tablet Take 1 tablet (25 mg total) by mouth every 6 (six) hours as needed for nausea or vomiting. 30 tablet 0   propranolol  (INDERAL ) 10 MG tablet Take 1 tablet (10 mg total) by mouth 2 (two) times daily. 60 tablet 2   valACYclovir (VALTREX) 1000 MG tablet Take 1,000 mg by mouth daily as needed (for fever blisters).     No current facility-administered  medications on file prior to visit.     ROS see history of present illness  Objective  Physical Exam Vitals:   04/16/24 1535  BP: 120/76  Pulse: 74  Temp: 98.3 F (36.8 C)  SpO2: 97%    BP Readings from Last 3 Encounters:  04/16/24 120/76  01/18/24 110/62  01/09/24 114/74   Wt Readings from Last 3 Encounters:  04/16/24 226 lb 12.8 oz (102.9 kg)  01/18/24 217 lb 8 oz (98.7 kg)  01/09/24 220 lb (99.8 kg)    Physical Exam Exam conducted with a chaperone present Claryce Ellen, CMA).  Constitutional:      General: She  is not in acute distress.    Appearance: Normal appearance.  HENT:     Head: Normocephalic.     Right Ear: Tympanic membrane normal.     Left Ear: Tympanic membrane normal.     Nose: Nose normal.     Mouth/Throat:     Mouth: Mucous membranes are moist.     Pharynx: Oropharynx is clear.  Eyes:     Conjunctiva/sclera: Conjunctivae normal.     Pupils: Pupils are equal, round, and reactive to light.  Neck:     Thyroid: No thyromegaly.  Cardiovascular:     Rate and Rhythm: Normal rate and regular rhythm.     Heart sounds: Normal heart sounds.  Pulmonary:     Effort: Pulmonary effort is normal.     Breath sounds: Normal breath sounds.  Abdominal:     General: Abdomen is flat. Bowel sounds are normal.     Palpations: Abdomen is soft. There is no mass.     Tenderness: There is no abdominal tenderness.  Genitourinary:    General: Normal vulva.     Labia:        Right: No rash or lesion.        Left: No rash or lesion.      Vagina: Normal. No vaginal discharge, tenderness or bleeding.     Comments: No cervix present Musculoskeletal:        General: Normal range of motion.  Lymphadenopathy:     Cervical: No cervical adenopathy.  Skin:    General: Skin is warm and dry.     Findings: No rash.  Neurological:     General: No focal deficit present.     Mental Status: She is alert.  Psychiatric:        Mood and Affect: Mood normal.        Behavior: Behavior normal.      Assessment/Plan: Please see individual problem list.  Well woman exam with routine gynecological exam Assessment & Plan: Physical exam complete. We will check lab work as outlined. Vaginal smear obtained today at patient's request. Mammogram due, order placed. Colonoscopy is up to date. Declines flu vaccine today, she will get at a later date. Tetanus vaccine is up to date. Declines additional COVID vaccines. Shingles vaccine series completed. Counseled on tobacco cessation. Continue routine dental and eye exams.  Encourage healthy diet and regular exercise. Return to care in one year, sooner as needed.   Orders: -     CBC with Differential/Platelet  Hyperlipidemia, unspecified hyperlipidemia type Assessment & Plan: Managed with Lipitor. Continue Lipitor and check lipid panel.   Orders: -     Atorvastatin  Calcium ; Take 1 tablet (10 mg total) by mouth every evening.  Dispense: 90 tablet; Refill: 3 -     Comprehensive metabolic panel with GFR -  Lipid panel  Vitamin D  deficiency Assessment & Plan: No current supplementation. Increased osteoporosis risk postmenopause. Check vitamin D  level and start vitamin D  supplementation.  Orders: -     VITAMIN D  25 Hydroxy (Vit-D Deficiency, Fractures)  Mild intermittent reactive airway disease without complication Assessment & Plan: Intermittent use of albuterol  inhaler with no recent exacerbations. Continue albuterol  inhaler as needed.  Orders: -     Albuterol  Sulfate HFA; Inhale 1-2 puffs into the lungs every 6 (six) hours as needed for wheezing or shortness of breath.  Dispense: 8.5 g; Refill: 2  Urinary frequency Assessment & Plan: Managed with oxybutynin . Transitioning care from gynecologist. Continue oxybutynin .  Orders: -     oxyBUTYnin  Chloride ER; Take 1 tablet (5 mg total) by mouth at bedtime.  Dispense: 90 tablet; Refill: 3 -     Hemoglobin A1c  Thyroid disorder screen -     TSH  Screening for cervical cancer -     Cytology - PAP  Screening mammogram for breast cancer -     3D Screening Mammogram, Left and Right; Future     Return in about 1 year (around 04/16/2025) for Annual Exam, sooner as needed.   Leron Glance, NP-C [facility]

## 2024-04-17 LAB — COMPREHENSIVE METABOLIC PANEL WITH GFR
ALT: 18 U/L (ref 0–35)
AST: 18 U/L (ref 0–37)
Albumin: 4.1 g/dL (ref 3.5–5.2)
Alkaline Phosphatase: 76 U/L (ref 39–117)
BUN: 10 mg/dL (ref 6–23)
CO2: 27 meq/L (ref 19–32)
Calcium: 9.1 mg/dL (ref 8.4–10.5)
Chloride: 102 meq/L (ref 96–112)
Creatinine, Ser: 0.79 mg/dL (ref 0.40–1.20)
GFR: 81.21 mL/min (ref >60.00)
Glucose, Bld: 92 mg/dL (ref 70–99)
Potassium: 3.6 meq/L (ref 3.5–5.1)
Sodium: 138 meq/L (ref 135–145)
Total Bilirubin: 0.3 mg/dL (ref 0.2–1.2)
Total Protein: 6.9 g/dL (ref 6.0–8.3)

## 2024-04-17 LAB — CBC WITH DIFFERENTIAL/PLATELET
Basophils Absolute: 0.1 K/uL (ref 0.0–0.1)
Basophils Relative: 0.7 % (ref 0.0–3.0)
Eosinophils Absolute: 0.3 K/uL (ref 0.0–0.7)
Eosinophils Relative: 2.3 % (ref 0.0–5.0)
HCT: 46.5 % — ABNORMAL HIGH (ref 36.0–46.0)
Hemoglobin: 15.4 g/dL — ABNORMAL HIGH (ref 12.0–15.0)
Lymphocytes Relative: 39.7 % (ref 12.0–46.0)
Lymphs Abs: 4.5 K/uL — ABNORMAL HIGH (ref 0.7–4.0)
MCHC: 33.1 g/dL (ref 30.0–36.0)
MCV: 87.9 fl (ref 78.0–100.0)
Monocytes Absolute: 0.7 K/uL (ref 0.1–1.0)
Monocytes Relative: 6 % (ref 3.0–12.0)
Neutro Abs: 5.8 K/uL (ref 1.4–7.7)
Neutrophils Relative %: 51.3 % (ref 43.0–77.0)
Platelets: 278 K/uL (ref 150.0–400.0)
RBC: 5.29 Mil/uL — ABNORMAL HIGH (ref 3.87–5.11)
RDW: 15.3 % (ref 11.5–15.5)
WBC: 11.3 K/uL — ABNORMAL HIGH (ref 4.0–10.5)

## 2024-04-17 LAB — LIPID PANEL
Cholesterol: 140 mg/dL (ref 0–200)
HDL: 34.8 mg/dL — ABNORMAL LOW (ref >39.00)
LDL Cholesterol: 85 mg/dL (ref 0–99)
NonHDL: 104.97
Total CHOL/HDL Ratio: 4
Triglycerides: 101 mg/dL (ref 0.0–149.0)
VLDL: 20.2 mg/dL (ref 0.0–40.0)

## 2024-04-17 LAB — VITAMIN D 25 HYDROXY (VIT D DEFICIENCY, FRACTURES): VITD: 32.63 ng/mL (ref 30.00–100.00)

## 2024-04-17 LAB — HEMOGLOBIN A1C: Hgb A1c MFr Bld: 6.5 % (ref 4.6–6.5)

## 2024-04-17 LAB — TSH: TSH: 1.47 u[IU]/mL (ref 0.35–5.50)

## 2024-04-18 LAB — CYTOLOGY - PAP
Comment: NEGATIVE
Diagnosis: NEGATIVE
High risk HPV: NEGATIVE

## 2024-04-24 ENCOUNTER — Ambulatory Visit: Payer: Self-pay | Admitting: Nurse Practitioner

## 2024-04-24 DIAGNOSIS — R7303 Prediabetes: Secondary | ICD-10-CM

## 2024-05-01 ENCOUNTER — Other Ambulatory Visit: Payer: Managed Care, Other (non HMO)

## 2024-05-01 ENCOUNTER — Ambulatory Visit: Payer: Managed Care, Other (non HMO) | Admitting: Internal Medicine

## 2024-05-06 ENCOUNTER — Encounter: Payer: Self-pay | Admitting: Nurse Practitioner

## 2024-05-06 DIAGNOSIS — Z01419 Encounter for gynecological examination (general) (routine) without abnormal findings: Secondary | ICD-10-CM | POA: Insufficient documentation

## 2024-05-06 DIAGNOSIS — R350 Frequency of micturition: Secondary | ICD-10-CM | POA: Insufficient documentation

## 2024-05-06 NOTE — Assessment & Plan Note (Signed)
 No current supplementation. Increased osteoporosis risk postmenopause. Check vitamin D  level and start vitamin D  supplementation.

## 2024-05-06 NOTE — Assessment & Plan Note (Signed)
 Managed with Lipitor. Continue Lipitor and check lipid panel.

## 2024-05-06 NOTE — Assessment & Plan Note (Addendum)
 Physical exam complete. We will check lab work as outlined. Vaginal smear obtained today at patient's request. Mammogram due, order placed. Colonoscopy is up to date. Declines flu vaccine today, she will get at a later date. Tetanus vaccine is up to date. Declines additional COVID vaccines. Shingles vaccine series completed. Counseled on tobacco cessation. Continue routine dental and eye exams. Encourage healthy diet and regular exercise. Return to care in one year, sooner as needed.

## 2024-05-06 NOTE — Assessment & Plan Note (Signed)
 Intermittent use of albuterol  inhaler with no recent exacerbations. Continue albuterol  inhaler as needed.

## 2024-05-06 NOTE — Assessment & Plan Note (Signed)
 Managed with oxybutynin . Transitioning care from gynecologist. Continue oxybutynin .

## 2024-06-18 ENCOUNTER — Ambulatory Visit
Admission: RE | Admit: 2024-06-18 | Discharge: 2024-06-18 | Disposition: A | Discharge status: Home / Self Care | Source: Ambulatory Visit | Attending: Nurse Practitioner | Admitting: Nurse Practitioner

## 2024-06-18 DIAGNOSIS — Z1231 Encounter for screening mammogram for malignant neoplasm of breast: Secondary | ICD-10-CM

## 2024-07-08 ENCOUNTER — Ambulatory Visit: Payer: Self-pay

## 2024-07-08 NOTE — Telephone Encounter (Signed)
 FYI Only or Action Required?: FYI only for provider: ED advised. Patient declined  Patient was last seen in primary care on 04/16/2024 by Gretel App, NP.  Called Nurse Triage reporting Eye Pain.  Symptoms began several days ago.  Interventions attempted: Nothing.  Symptoms are: unchanged.  Triage Disposition: Go to ED Now (Notify PCP)  Patient/caregiver understands and will follow disposition?: No, wishes to speak with PCP   Copied from CRM #[id number]. Topic: Clinical - Red Word Triage >> Jul 08, 2024  8:24 AM Jennifer [name] wrote: [facility] that prompted transfer to Nurse Triage: Swollen eye ( right ) in pain and very swollen - since Thanksgiving.. she is unsure if she has 2 stys.. but not 100% sure. Reason for Disposition  [1] Eyelid (outer) is very red AND [2] fever    Blurred vision; nurse judgement  Answer Assessment - Initial Assessment Questions Advised ED. Patient declines requests appt with pcp.  1. ONSET: When did the swelling start? (e.[name]., minutes, hours, days)     Thursday 2. LOCATION: What part of the eyelids is swollen?     Right eye 3. SEVERITY: How swollen is it?     Severe 4. ITCHING: Is there any itching? If Yes, ask: How much?   (Scale 1-10; mild, moderate or severe)   Severe; rub eye 5. PAIN: Is the swelling painful to touch? If Yes, ask: How painful is it?   (Scale 1-10; mild, moderate or severe)     5/10 6. FEVER: Do you have a fever? If Yes, ask: What is it, how was it measured, and when did it start?      denies 7. CAUSE: What do you think is causing the swelling?     unsure 8. RECURRENT SYMPTOM: Have you had eyelid swelling before? If Yes, ask: When was the last time? What happened that time?     no 9. OTHER SYMPTOMS: Do you have any other symptoms? (e.[name]., blurred vision, eye discharge, rash, runny nose)     Blurred vision constant, watery, HA denies rash, runny nose,  Protocols used: Eye - Swelling-A-AH

## 2024-07-08 NOTE — Telephone Encounter (Signed)
 Called pt and got voicemail vm left asking pt to CB

## 2024-07-09 ENCOUNTER — Ambulatory Visit: Admitting: Family
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
## 2024-07-15 ENCOUNTER — Ambulatory Visit (HOSPITAL_COMMUNITY): Admitting: Psychiatry

## 2024-07-15 DIAGNOSIS — F3340 Major depressive disorder, recurrent, in remission, unspecified: Secondary | ICD-10-CM

## 2024-07-15 DIAGNOSIS — F4310 Post-traumatic stress disorder, unspecified: Secondary | ICD-10-CM

## 2024-07-15 DIAGNOSIS — F411 Generalized anxiety disorder: Secondary | ICD-10-CM

## 2024-07-15 MED ORDER — PAROXETINE HCL 40 MG PO TABS: 60.0000 mg | ORAL_TABLET | ORAL | 0 refills | Status: AC

## 2024-07-15 MED ORDER — PROPRANOLOL HCL 10 MG PO TABS: 10.0000 mg | ORAL_TABLET | Freq: Two times a day (BID) | ORAL | 0 refills | Status: AC

## 2024-07-15 MED ORDER — DIVALPROEX SODIUM ER 250 MG PO TB24: 250.0000 mg | ORAL_TABLET | Freq: Every day | ORAL | 0 refills | Status: AC

## 2024-07-16 NOTE — Addendum Note (Signed)
 Addended by: CARVIN CROCK on: 07/16/2024 07:59 AM   Modules accepted: Level of Service

## 2024-07-22 ENCOUNTER — Other Ambulatory Visit

## 2024-08-12 ENCOUNTER — Other Ambulatory Visit (INDEPENDENT_AMBULATORY_CARE_PROVIDER_SITE_OTHER)

## 2024-08-12 DIAGNOSIS — R7303 Prediabetes: Secondary | ICD-10-CM | POA: Diagnosis not present

## 2024-08-12 LAB — HEMOGLOBIN A1C: Hgb A1c MFr Bld: 6.4 % (ref 4.6–6.5)

## 2024-08-13 ENCOUNTER — Ambulatory Visit: Payer: Self-pay | Admitting: Nurse Practitioner

## 2024-10-07 ENCOUNTER — Telehealth (HOSPITAL_COMMUNITY): Admitting: Psychiatry

## 2025-04-16 ENCOUNTER — Encounter: Admitting: Nurse Practitioner
